# Patient Record
Sex: Female | Born: 1955 | Race: Black or African American | Hispanic: No | Smoking: Current every day smoker
Health system: Southern US, Community
[De-identification: ages and names within clinical notes are randomized; demographics above are authoritative.]

## PROBLEM LIST (undated history)

## (undated) DIAGNOSIS — M797 Fibromyalgia: Secondary | ICD-10-CM

## (undated) DIAGNOSIS — K219 Gastro-esophageal reflux disease without esophagitis: Secondary | ICD-10-CM

## (undated) DIAGNOSIS — G5600 Carpal tunnel syndrome, unspecified upper limb: Secondary | ICD-10-CM

## (undated) DIAGNOSIS — F329 Major depressive disorder, single episode, unspecified: Secondary | ICD-10-CM

## (undated) DIAGNOSIS — J449 Chronic obstructive pulmonary disease, unspecified: Secondary | ICD-10-CM

## (undated) DIAGNOSIS — E785 Hyperlipidemia, unspecified: Secondary | ICD-10-CM

## (undated) DIAGNOSIS — E039 Hypothyroidism, unspecified: Secondary | ICD-10-CM

## (undated) DIAGNOSIS — G43909 Migraine, unspecified, not intractable, without status migrainosus: Secondary | ICD-10-CM

## (undated) DIAGNOSIS — G8929 Other chronic pain: Secondary | ICD-10-CM

## (undated) DIAGNOSIS — M2170 Unequal limb length (acquired), unspecified site: Secondary | ICD-10-CM

## (undated) DIAGNOSIS — R269 Unspecified abnormalities of gait and mobility: Secondary | ICD-10-CM

## (undated) DIAGNOSIS — C73 Malignant neoplasm of thyroid gland: Secondary | ICD-10-CM

## (undated) DIAGNOSIS — J189 Pneumonia, unspecified organism: Secondary | ICD-10-CM

## (undated) DIAGNOSIS — R0602 Shortness of breath: Secondary | ICD-10-CM

## (undated) DIAGNOSIS — M549 Dorsalgia, unspecified: Secondary | ICD-10-CM

## (undated) DIAGNOSIS — M1990 Unspecified osteoarthritis, unspecified site: Secondary | ICD-10-CM

## (undated) DIAGNOSIS — M069 Rheumatoid arthritis, unspecified: Secondary | ICD-10-CM

## (undated) DIAGNOSIS — R001 Bradycardia, unspecified: Secondary | ICD-10-CM

## (undated) HISTORY — DX: Bradycardia, unspecified: R00.1

## (undated) HISTORY — DX: Unspecified abnormalities of gait and mobility: R26.9

## (undated) HISTORY — PX: THYROIDECTOMY, PARTIAL: SHX18

## (undated) HISTORY — DX: Malignant neoplasm of thyroid gland: C73

## (undated) HISTORY — DX: Unequal limb length (acquired), unspecified site: M21.70

## (undated) HISTORY — DX: Gastro-esophageal reflux disease without esophagitis: K21.9

## (undated) HISTORY — DX: Carpal tunnel syndrome, unspecified upper limb: G56.00

## (undated) HISTORY — DX: Major depressive disorder, single episode, unspecified: F32.9

## (undated) HISTORY — PX: TUBAL LIGATION: SHX77

## (undated) HISTORY — DX: Hyperlipidemia, unspecified: E78.5

## (undated) HISTORY — PX: TOTAL ABDOMINAL HYSTERECTOMY: SHX209

## (undated) HISTORY — DX: Chronic obstructive pulmonary disease, unspecified: J44.9

## (undated) HISTORY — DX: Hypothyroidism, unspecified: E03.9

---

## 1998-09-08 ENCOUNTER — Encounter: Admission: RE | Admit: 1998-09-08 | Discharge: 1998-09-08 | Payer: Self-pay | Admitting: Family Medicine

## 1998-10-23 ENCOUNTER — Encounter: Admission: RE | Admit: 1998-10-23 | Discharge: 1998-10-23 | Payer: Self-pay | Admitting: Family Medicine

## 1998-11-25 ENCOUNTER — Encounter: Admission: RE | Admit: 1998-11-25 | Discharge: 1998-11-25 | Payer: Self-pay | Admitting: Sports Medicine

## 1999-03-11 ENCOUNTER — Encounter: Admission: RE | Admit: 1999-03-11 | Discharge: 1999-03-11 | Payer: Self-pay | Admitting: Family Medicine

## 1999-03-18 ENCOUNTER — Encounter: Admission: RE | Admit: 1999-03-18 | Discharge: 1999-03-18 | Payer: Self-pay | Admitting: Family Medicine

## 1999-04-05 ENCOUNTER — Emergency Department (HOSPITAL_COMMUNITY): Admission: EM | Admit: 1999-04-05 | Discharge: 1999-04-05 | Payer: Self-pay | Admitting: Emergency Medicine

## 1999-04-20 ENCOUNTER — Other Ambulatory Visit: Admission: RE | Admit: 1999-04-20 | Discharge: 1999-04-20 | Payer: Self-pay | Admitting: Obstetrics and Gynecology

## 1999-09-16 ENCOUNTER — Encounter (INDEPENDENT_AMBULATORY_CARE_PROVIDER_SITE_OTHER): Payer: Self-pay | Admitting: *Deleted

## 2000-11-04 ENCOUNTER — Other Ambulatory Visit: Admission: RE | Admit: 2000-11-04 | Discharge: 2000-11-04 | Payer: Self-pay | Admitting: Obstetrics and Gynecology

## 2000-11-20 ENCOUNTER — Encounter: Payer: Self-pay | Admitting: *Deleted

## 2000-11-20 ENCOUNTER — Emergency Department (HOSPITAL_COMMUNITY): Admission: EM | Admit: 2000-11-20 | Discharge: 2000-11-20 | Payer: Self-pay | Admitting: Emergency Medicine

## 2000-11-23 ENCOUNTER — Encounter: Payer: Self-pay | Admitting: *Deleted

## 2000-11-23 ENCOUNTER — Encounter: Admission: RE | Admit: 2000-11-23 | Discharge: 2000-11-23 | Payer: Self-pay | Admitting: *Deleted

## 2000-11-25 ENCOUNTER — Encounter: Admission: RE | Admit: 2000-11-25 | Discharge: 2000-11-25 | Payer: Self-pay | Admitting: Family Medicine

## 2001-01-09 ENCOUNTER — Encounter (INDEPENDENT_AMBULATORY_CARE_PROVIDER_SITE_OTHER): Payer: Self-pay

## 2001-01-09 ENCOUNTER — Observation Stay (HOSPITAL_COMMUNITY): Admission: RE | Admit: 2001-01-09 | Discharge: 2001-01-10 | Payer: Self-pay | Admitting: Obstetrics and Gynecology

## 2001-03-01 ENCOUNTER — Encounter: Admission: RE | Admit: 2001-03-01 | Discharge: 2001-03-01 | Payer: Self-pay | Admitting: Family Medicine

## 2001-03-15 ENCOUNTER — Encounter: Admission: RE | Admit: 2001-03-15 | Discharge: 2001-03-15 | Payer: Self-pay | Admitting: Family Medicine

## 2001-03-21 ENCOUNTER — Encounter: Admission: RE | Admit: 2001-03-21 | Discharge: 2001-03-21 | Payer: Self-pay | Admitting: Sports Medicine

## 2001-03-21 ENCOUNTER — Encounter: Payer: Self-pay | Admitting: Sports Medicine

## 2001-04-26 ENCOUNTER — Encounter: Admission: RE | Admit: 2001-04-26 | Discharge: 2001-04-26 | Payer: Self-pay | Admitting: Family Medicine

## 2001-05-01 ENCOUNTER — Encounter: Admission: RE | Admit: 2001-05-01 | Discharge: 2001-05-01 | Payer: Self-pay | Admitting: Family Medicine

## 2001-07-12 ENCOUNTER — Encounter: Admission: RE | Admit: 2001-07-12 | Discharge: 2001-07-12 | Payer: Self-pay | Admitting: Family Medicine

## 2001-07-26 ENCOUNTER — Encounter: Admission: RE | Admit: 2001-07-26 | Discharge: 2001-07-26 | Payer: Self-pay | Admitting: Family Medicine

## 2002-01-12 ENCOUNTER — Emergency Department (HOSPITAL_COMMUNITY): Admission: EM | Admit: 2002-01-12 | Discharge: 2002-01-12 | Payer: Self-pay | Admitting: Emergency Medicine

## 2002-01-22 ENCOUNTER — Emergency Department (HOSPITAL_COMMUNITY): Admission: EM | Admit: 2002-01-22 | Discharge: 2002-01-22 | Payer: Self-pay | Admitting: Emergency Medicine

## 2002-01-22 ENCOUNTER — Encounter: Payer: Self-pay | Admitting: Emergency Medicine

## 2002-07-17 ENCOUNTER — Emergency Department (HOSPITAL_COMMUNITY): Admission: EM | Admit: 2002-07-17 | Discharge: 2002-07-17 | Payer: Self-pay | Admitting: Emergency Medicine

## 2002-07-17 ENCOUNTER — Encounter: Payer: Self-pay | Admitting: Emergency Medicine

## 2002-10-15 ENCOUNTER — Encounter: Admission: RE | Admit: 2002-10-15 | Discharge: 2002-10-15 | Payer: Self-pay | Admitting: Family Medicine

## 2002-11-16 ENCOUNTER — Encounter: Admission: RE | Admit: 2002-11-16 | Discharge: 2002-11-16 | Payer: Self-pay | Admitting: Family Medicine

## 2002-11-29 ENCOUNTER — Encounter: Admission: RE | Admit: 2002-11-29 | Discharge: 2002-11-29 | Payer: Self-pay | Admitting: Family Medicine

## 2002-11-30 ENCOUNTER — Encounter: Admission: RE | Admit: 2002-11-30 | Discharge: 2002-11-30 | Payer: Self-pay | Admitting: Family Medicine

## 2003-01-18 ENCOUNTER — Encounter: Payer: Self-pay | Admitting: *Deleted

## 2003-01-18 ENCOUNTER — Emergency Department (HOSPITAL_COMMUNITY): Admission: EM | Admit: 2003-01-18 | Discharge: 2003-01-18 | Payer: Self-pay | Admitting: Emergency Medicine

## 2003-01-23 ENCOUNTER — Encounter: Admission: RE | Admit: 2003-01-23 | Discharge: 2003-01-23 | Payer: Self-pay | Admitting: Family Medicine

## 2003-02-13 ENCOUNTER — Encounter: Admission: RE | Admit: 2003-02-13 | Discharge: 2003-02-13 | Payer: Self-pay | Admitting: Family Medicine

## 2003-02-21 ENCOUNTER — Encounter: Admission: RE | Admit: 2003-02-21 | Discharge: 2003-02-21 | Payer: Self-pay | Admitting: Family Medicine

## 2003-03-07 ENCOUNTER — Encounter: Admission: RE | Admit: 2003-03-07 | Discharge: 2003-03-07 | Payer: Self-pay | Admitting: Family Medicine

## 2004-11-15 DIAGNOSIS — F32A Depression, unspecified: Secondary | ICD-10-CM

## 2004-11-15 HISTORY — DX: Depression, unspecified: F32.A

## 2005-03-23 ENCOUNTER — Emergency Department (HOSPITAL_COMMUNITY): Admission: EM | Admit: 2005-03-23 | Discharge: 2005-03-23 | Payer: Self-pay | Admitting: Family Medicine

## 2005-08-16 ENCOUNTER — Encounter (INDEPENDENT_AMBULATORY_CARE_PROVIDER_SITE_OTHER): Payer: Self-pay | Admitting: Specialist

## 2005-08-16 ENCOUNTER — Encounter: Admission: RE | Admit: 2005-08-16 | Discharge: 2005-08-16 | Payer: Self-pay | Admitting: Emergency Medicine

## 2005-08-16 ENCOUNTER — Other Ambulatory Visit: Admission: RE | Admit: 2005-08-16 | Discharge: 2005-08-16 | Payer: Self-pay | Admitting: Interventional Radiology

## 2005-11-13 ENCOUNTER — Emergency Department (HOSPITAL_COMMUNITY): Admission: EM | Admit: 2005-11-13 | Discharge: 2005-11-13 | Payer: Self-pay | Admitting: Family Medicine

## 2005-11-19 ENCOUNTER — Emergency Department (HOSPITAL_COMMUNITY): Admission: EM | Admit: 2005-11-19 | Discharge: 2005-11-19 | Payer: Self-pay | Admitting: Family Medicine

## 2005-12-06 ENCOUNTER — Encounter (INDEPENDENT_AMBULATORY_CARE_PROVIDER_SITE_OTHER): Payer: Self-pay | Admitting: *Deleted

## 2005-12-06 ENCOUNTER — Ambulatory Visit (HOSPITAL_COMMUNITY): Admission: RE | Admit: 2005-12-06 | Discharge: 2005-12-07 | Payer: Self-pay

## 2005-12-27 ENCOUNTER — Emergency Department (HOSPITAL_COMMUNITY): Admission: EM | Admit: 2005-12-27 | Discharge: 2005-12-27 | Payer: Self-pay | Admitting: Family Medicine

## 2006-01-28 ENCOUNTER — Encounter (INDEPENDENT_AMBULATORY_CARE_PROVIDER_SITE_OTHER): Payer: Self-pay | Admitting: Specialist

## 2006-01-28 ENCOUNTER — Ambulatory Visit (HOSPITAL_COMMUNITY): Admission: RE | Admit: 2006-01-28 | Discharge: 2006-01-29 | Payer: Self-pay

## 2006-03-08 ENCOUNTER — Encounter: Admission: RE | Admit: 2006-03-08 | Discharge: 2006-03-08 | Payer: Self-pay | Admitting: Endocrinology

## 2006-03-17 ENCOUNTER — Encounter: Admission: RE | Admit: 2006-03-17 | Discharge: 2006-03-17 | Payer: Self-pay | Admitting: Endocrinology

## 2006-03-24 ENCOUNTER — Encounter: Admission: RE | Admit: 2006-03-24 | Discharge: 2006-03-24 | Payer: Self-pay | Admitting: Endocrinology

## 2006-07-27 ENCOUNTER — Encounter: Admission: RE | Admit: 2006-07-27 | Discharge: 2006-07-27 | Payer: Self-pay | Admitting: Internal Medicine

## 2006-07-31 ENCOUNTER — Emergency Department (HOSPITAL_COMMUNITY): Admission: EM | Admit: 2006-07-31 | Discharge: 2006-07-31 | Payer: Self-pay | Admitting: Emergency Medicine

## 2006-11-10 ENCOUNTER — Emergency Department (HOSPITAL_COMMUNITY): Admission: EM | Admit: 2006-11-10 | Discharge: 2006-11-10 | Payer: Self-pay | Admitting: Emergency Medicine

## 2007-01-13 ENCOUNTER — Encounter (INDEPENDENT_AMBULATORY_CARE_PROVIDER_SITE_OTHER): Payer: Self-pay | Admitting: *Deleted

## 2007-03-27 ENCOUNTER — Ambulatory Visit: Payer: Self-pay | Admitting: Psychiatry

## 2007-03-27 ENCOUNTER — Other Ambulatory Visit (HOSPITAL_COMMUNITY): Admission: RE | Admit: 2007-03-27 | Discharge: 2007-04-07 | Payer: Self-pay | Admitting: Psychiatry

## 2007-05-03 ENCOUNTER — Ambulatory Visit (HOSPITAL_COMMUNITY): Payer: Self-pay | Admitting: Psychiatry

## 2007-05-24 ENCOUNTER — Encounter: Admission: RE | Admit: 2007-05-24 | Discharge: 2007-05-24 | Payer: Self-pay | Admitting: Endocrinology

## 2007-05-30 ENCOUNTER — Ambulatory Visit (HOSPITAL_COMMUNITY): Payer: Self-pay | Admitting: Psychiatry

## 2007-08-10 ENCOUNTER — Emergency Department (HOSPITAL_COMMUNITY): Admission: EM | Admit: 2007-08-10 | Discharge: 2007-08-10 | Payer: Self-pay | Admitting: Emergency Medicine

## 2007-11-23 ENCOUNTER — Emergency Department (HOSPITAL_COMMUNITY): Admission: EM | Admit: 2007-11-23 | Discharge: 2007-11-23 | Payer: Self-pay | Admitting: Family Medicine

## 2007-12-06 ENCOUNTER — Emergency Department (HOSPITAL_COMMUNITY): Admission: EM | Admit: 2007-12-06 | Discharge: 2007-12-06 | Payer: Self-pay | Admitting: Emergency Medicine

## 2008-05-13 ENCOUNTER — Emergency Department (HOSPITAL_COMMUNITY): Admission: EM | Admit: 2008-05-13 | Discharge: 2008-05-13 | Payer: Self-pay | Admitting: Emergency Medicine

## 2008-06-05 ENCOUNTER — Emergency Department (HOSPITAL_COMMUNITY): Admission: EM | Admit: 2008-06-05 | Discharge: 2008-06-05 | Payer: Self-pay | Admitting: Emergency Medicine

## 2008-10-08 ENCOUNTER — Inpatient Hospital Stay (HOSPITAL_COMMUNITY): Admission: EM | Admit: 2008-10-08 | Discharge: 2008-10-09 | Payer: Self-pay | Admitting: Emergency Medicine

## 2008-10-16 ENCOUNTER — Ambulatory Visit: Payer: Self-pay | Admitting: Cardiology

## 2008-10-28 ENCOUNTER — Emergency Department (HOSPITAL_COMMUNITY): Admission: EM | Admit: 2008-10-28 | Discharge: 2008-10-28 | Payer: Self-pay | Admitting: Emergency Medicine

## 2008-11-01 ENCOUNTER — Ambulatory Visit: Payer: Self-pay

## 2008-11-01 ENCOUNTER — Encounter: Payer: Self-pay | Admitting: Cardiology

## 2008-11-28 ENCOUNTER — Ambulatory Visit: Payer: Self-pay | Admitting: Cardiology

## 2008-12-09 ENCOUNTER — Encounter: Payer: Self-pay | Admitting: Cardiology

## 2008-12-09 ENCOUNTER — Ambulatory Visit: Payer: Self-pay

## 2009-01-08 ENCOUNTER — Emergency Department (HOSPITAL_COMMUNITY): Admission: EM | Admit: 2009-01-08 | Discharge: 2009-01-08 | Payer: Self-pay | Admitting: Emergency Medicine

## 2009-03-17 ENCOUNTER — Emergency Department (HOSPITAL_COMMUNITY): Admission: EM | Admit: 2009-03-17 | Discharge: 2009-03-17 | Payer: Self-pay | Admitting: Emergency Medicine

## 2009-06-30 ENCOUNTER — Encounter (HOSPITAL_COMMUNITY): Admission: RE | Admit: 2009-06-30 | Discharge: 2009-08-14 | Payer: Self-pay | Admitting: Endocrinology

## 2009-12-03 ENCOUNTER — Ambulatory Visit: Payer: Self-pay | Admitting: Internal Medicine

## 2009-12-03 ENCOUNTER — Emergency Department (HOSPITAL_COMMUNITY): Admission: EM | Admit: 2009-12-03 | Discharge: 2009-12-03 | Payer: Self-pay | Admitting: Emergency Medicine

## 2010-02-11 DIAGNOSIS — E039 Hypothyroidism, unspecified: Secondary | ICD-10-CM

## 2010-02-11 DIAGNOSIS — F3289 Other specified depressive episodes: Secondary | ICD-10-CM | POA: Insufficient documentation

## 2010-02-11 DIAGNOSIS — C73 Malignant neoplasm of thyroid gland: Secondary | ICD-10-CM

## 2010-02-11 DIAGNOSIS — I495 Sick sinus syndrome: Secondary | ICD-10-CM

## 2010-02-11 DIAGNOSIS — K219 Gastro-esophageal reflux disease without esophagitis: Secondary | ICD-10-CM | POA: Insufficient documentation

## 2010-02-11 DIAGNOSIS — F329 Major depressive disorder, single episode, unspecified: Secondary | ICD-10-CM

## 2010-02-11 DIAGNOSIS — J449 Chronic obstructive pulmonary disease, unspecified: Secondary | ICD-10-CM | POA: Insufficient documentation

## 2010-02-11 DIAGNOSIS — R079 Chest pain, unspecified: Secondary | ICD-10-CM

## 2010-02-11 DIAGNOSIS — R42 Dizziness and giddiness: Secondary | ICD-10-CM | POA: Insufficient documentation

## 2010-02-12 ENCOUNTER — Ambulatory Visit: Payer: Self-pay | Admitting: Cardiology

## 2010-02-12 DIAGNOSIS — F17200 Nicotine dependence, unspecified, uncomplicated: Secondary | ICD-10-CM

## 2010-05-28 ENCOUNTER — Emergency Department (HOSPITAL_COMMUNITY): Admission: EM | Admit: 2010-05-28 | Discharge: 2010-05-28 | Payer: Self-pay | Admitting: Emergency Medicine

## 2010-06-19 ENCOUNTER — Ambulatory Visit (HOSPITAL_COMMUNITY): Admission: RE | Admit: 2010-06-19 | Discharge: 2010-06-19 | Payer: Self-pay | Admitting: Psychiatry

## 2010-06-22 ENCOUNTER — Emergency Department (HOSPITAL_COMMUNITY): Admission: EM | Admit: 2010-06-22 | Discharge: 2010-06-22 | Payer: Self-pay | Admitting: Family Medicine

## 2010-06-22 ENCOUNTER — Ambulatory Visit: Payer: Self-pay | Admitting: Cardiology

## 2010-06-22 ENCOUNTER — Encounter: Payer: Self-pay | Admitting: Cardiology

## 2010-06-22 ENCOUNTER — Observation Stay (HOSPITAL_COMMUNITY): Admission: EM | Admit: 2010-06-22 | Discharge: 2010-06-23 | Payer: Self-pay | Admitting: Emergency Medicine

## 2010-06-22 ENCOUNTER — Other Ambulatory Visit (HOSPITAL_COMMUNITY): Admission: RE | Admit: 2010-06-22 | Discharge: 2010-07-08 | Payer: Self-pay | Admitting: Psychiatry

## 2010-06-23 ENCOUNTER — Encounter: Payer: Self-pay | Admitting: Cardiology

## 2010-06-25 ENCOUNTER — Telehealth (INDEPENDENT_AMBULATORY_CARE_PROVIDER_SITE_OTHER): Payer: Self-pay | Admitting: Radiology

## 2010-06-29 ENCOUNTER — Encounter (HOSPITAL_COMMUNITY): Admission: RE | Admit: 2010-06-29 | Discharge: 2010-08-04 | Payer: Self-pay | Admitting: Cardiology

## 2010-06-29 ENCOUNTER — Encounter: Payer: Self-pay | Admitting: Cardiology

## 2010-06-29 ENCOUNTER — Ambulatory Visit: Payer: Self-pay

## 2010-06-29 ENCOUNTER — Ambulatory Visit: Payer: Self-pay | Admitting: Cardiology

## 2010-07-08 ENCOUNTER — Encounter (INDEPENDENT_AMBULATORY_CARE_PROVIDER_SITE_OTHER): Payer: Self-pay | Admitting: *Deleted

## 2010-07-10 ENCOUNTER — Ambulatory Visit: Payer: Self-pay | Admitting: Psychiatry

## 2010-07-10 ENCOUNTER — Telehealth: Payer: Self-pay | Admitting: Cardiology

## 2010-07-21 ENCOUNTER — Encounter: Payer: Self-pay | Admitting: Physician Assistant

## 2010-10-12 ENCOUNTER — Ambulatory Visit (HOSPITAL_COMMUNITY)
Admission: RE | Admit: 2010-10-12 | Discharge: 2010-10-12 | Payer: Self-pay | Source: Home / Self Care | Admitting: Internal Medicine

## 2010-10-13 HISTORY — PX: COLONOSCOPY: SHX174

## 2010-10-15 ENCOUNTER — Encounter: Admission: RE | Admit: 2010-10-15 | Discharge: 2010-10-15 | Payer: Self-pay | Admitting: Internal Medicine

## 2010-10-18 ENCOUNTER — Observation Stay (HOSPITAL_COMMUNITY)
Admission: EM | Admit: 2010-10-18 | Discharge: 2010-10-20 | Payer: Self-pay | Source: Home / Self Care | Admitting: Emergency Medicine

## 2010-11-11 ENCOUNTER — Ambulatory Visit: Payer: Self-pay | Admitting: Cardiology

## 2010-12-06 ENCOUNTER — Encounter: Payer: Self-pay | Admitting: Endocrinology

## 2010-12-06 ENCOUNTER — Encounter: Payer: Self-pay | Admitting: Internal Medicine

## 2010-12-17 NOTE — Miscellaneous (Signed)
  Clinical Lists Changes  Observations: Added new observation of NUCLEAR NOS: Exercise Capacity: Good exercise capacity. BP Response: Normal blood pressure response. Clinical Symptoms: No chest pain ECG Impression: No significant ST segment change suggestive of ischemia. Overall Impression: Normal stress nuclear study.  She had PVCs in recovery.   (06/29/2010 10:46) Added new observation of CXR RESULTS: The heart size and pulmonary vascularity are normal and   the lungs are clear.  No osseous abnormality. Evidence of prior   thyroid surgery.    IMPRESSION:   Normal chest. (06/22/2010 10:47)      Nuclear Study  Procedure date:  06/29/2010  Findings:      Exercise Capacity: Good exercise capacity. BP Response: Normal blood pressure response. Clinical Symptoms: No chest pain ECG Impression: No significant ST segment change suggestive of ischemia. Overall Impression: Normal stress nuclear study.  She had PVCs in recovery.    CXR  Procedure date:  06/22/2010  Findings:      The heart size and pulmonary vascularity are normal and   the lungs are clear.  No osseous abnormality. Evidence of prior   thyroid surgery.    IMPRESSION:   Normal chest.

## 2010-12-17 NOTE — Progress Notes (Signed)
Summary: return to work  Phone Note Call from Patient Call back at [phone]   Summary of Call: pt would like to know if she can go back to work. 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

## 2010-12-17 NOTE — Assessment & Plan Note (Signed)
Summary: rov per pt call/lg  Medications Added SYNTHROID 137 MCG TABS (LEVOTHYROXINE SODIUM) 1 tab by mouth once daily VITAMIN C 500 MG TABS (ASCORBIC ACID) 1 tab by mouth once daily ASPIRIN 81 MG TBEC (ASPIRIN) Take one tablet by mouth daily        History of Present Illness: Emma Wilson is a pleasant  female who I have seen in the past for dizziness, chest pain, and bradycardia.  It was noted previously that her TSH had come back at 123.  We felt that her symptoms were most likely related to hypothyroidism.  We did increase her Synthroid from 88 mcg a day to 125 mcg a day. Echocardiogram in December of 2009 showed normal LV function and no significant valvular abnormalities. Stress echocardiogram in January of 2010 was normal. I last saw her in January of 2010. Since then she was seen in the emergency room by Dr. Gala Romney for her atypical chest pain and bradycardia. Note enzymes were negative. A chest x-ray showed COPD. Since that time she occasionally has mild dyspnea on exertion relieved with rest. There is no associated chest pain. There is no orthopnea, PND or pedal edema. She's had no further palpitations and there's been no syncope. She occasionally feels a brief pain when she is "worried" she does not have exertional chest pain.  Current Medications (verified): 1)  Synthroid 137 Mcg Tabs (Levothyroxine Sodium) .Marland Kitchen.. 1 Tab By Mouth Once Daily 2)  Vitamin C 500 Mg Tabs (Ascorbic Acid) .Marland Kitchen.. 1 Tab By Mouth Once Daily 3)  Aspirin 81 Mg Tbec (Aspirin) .... Take One Tablet By Mouth Daily  Past History:  Past Medical History: THYROID CANCER (ICD-193) COPD (ICD-496) DEPRESSION (ICD-311) GERD (ICD-530.81) SINUS BRADYCARDIA (ICD-427.81) HYPOTHYROIDISM (ICD-244.9) microscopic hematuria (urol & nephrol w/u neg), multiple UTI`s  Past Surgical History: Reviewed history from 02/11/2010 and no changes required. TAH, BSO (benign reasons) - 01/14/2003   hysterectomy   thyroidectomy   Social  History: Reviewed history from 02/11/2010 and no changes required. The patient has a 16 plus-pack year ongoing tobacco   abuse disorder, currently smoking approximately one-half pack per day.   She very rarely consumes EtOH, never binge drinks.  No illicit drug use.   No herbal medication.  Regular diet and no regular exercise.   Review of Systems       Some fatigue and occasional leg pain but no fevers or chills, productive cough, hemoptysis, dysphasia, odynophagia, melena, hematochezia, dysuria, hematuria, rash, seizure activity, orthopnea, PND, pedal edema, claudication. Remaining systems are negative.   Vital Signs:  Patient profile:   55 year old female Height:      67 inches Weight:      136 pounds BMI:     21.38 Pulse rate:   51 / minute Resp:     12 per minute BP sitting:   115 / 80  (left arm)  Vitals Entered By: Kem Parkinson (February 12, 2010 8:16 AM)  Physical Exam  General:  Well-developed well-nourished in no acute distress.  Skin is warm and dry.  HEENT is normal.  Neck is supple. No thyromegaly.  Chest is clear to auscultation with normal expansion.  Cardiovascular exam is regular rate and rhythm.  Abdominal exam nontender or distended. No masses palpated. Extremities show no edema. neuro grossly intact    EKG  Procedure date:  02/12/2010  Findings:      Sinus bradycardia at a rate of 51. Axis normal. Nonspecific ST changes.  Impression & Recommendations:  Problem #  1:  SINUS BRADYCARDIA (ICD-427.81) No obvious symptoms. No further intervention warranted. Her updated medication list for this problem includes:    Aspirin 81 Mg Tbec (Aspirin) .Marland Kitchen... Take one tablet by mouth daily  Problem # 2:  DIZZINESS (ICD-780.4) No further symptoms.  Problem # 3:  COPD (ICD-496) Management per primary care.  Problem # 4:  HYPOTHYROIDISM (ICD-244.9) Management per endocrinology. Her updated medication list for this problem includes:    Synthroid 137 Mcg  Tabs (Levothyroxine sodium) .Marland Kitchen... 1 tab by mouth once daily  Problem # 5:  CHEST PAIN (ICD-786.50) Symptoms atypical. Previous stress echo normal. No further workup at this time. Her updated medication list for this problem includes:    Aspirin 81 Mg Tbec (Aspirin) .Marland Kitchen... Take one tablet by mouth daily  Problem # 6:  TOBACCO ABUSE (ICD-305.1) Patient counseled on discontinuing.

## 2010-12-17 NOTE — Letter (Signed)
Summary: Appointment - Reschedule  [facility], Main Office  [address]   [place], [place] [address]   Phone: [phone]  Fax: [phone]     July 08, 2010 MRN: [id number]   [facility] [address] [place], [place]  [address]   Dear Ms. Harvel,   Due to a change in our office schedule, your appointment on August 30,2011 at  12:15 must be changed.  It is very important that we reach you to reschedule this appointment. We look forward to participating in your health care needs. Please contact us at the number listed above at your earliest convenience to reschedule this appointment.     Sincerely, Artist

## 2010-12-17 NOTE — Progress Notes (Signed)
Summary: Nuc Pre- Procedure  Phone Note Outgoing Call Call back at Home Phone [phone]   Call placed by: Antionette Char RN,  June 25, 2010 1:29 PM Call placed to: Patient Reason for Call: Confirm/change Appt Summary of Call: Attempted to call three times with no answer.

## 2010-12-17 NOTE — Assessment & Plan Note (Signed)
Summary: Cardiology Nuclear Testing  Nuclear Med Background Indications for Stress Test: Evaluation for Ischemia, Post Hospital  Indications Comments: 06/22/10 [facility] CP with Neg. Enzymes, Abnormal EKG  History: COPD, Echo  History Comments: 1/10 Stress Echo Negative History of Thyroid Cancer  Symptoms: Chest Pain, Chest Pressure, Chest Pressure with Exertion, Diaphoresis, Dizziness, DOE, Fatigue, Fatigue with Exertion, Light-Headedness, Nausea, Palpitations, SOB  Symptoms Comments: Continuous (L) hand  numbness x 1 week per patient.   Nuclear Pre-Procedure Cardiac Risk Factors: Family History - CAD, Lipids, Smoker Caffeine/Decaff Intake: None NPO After: 8:00 PM Lungs: Clear IV 0.9% NS with Angio Cath: 22g     IV Site: (R) Hand IV Started by: Edwyna Perfect, RN Chest Size (in) 36     Cup Size C     Height (in): 67 Weight (lb): 135 BMI: 21.22  Nuclear Med Study 1 or 2 day study:  1 day     Stress Test Type:  Stress Reading MD:  Willa Rough, MD     Referring MD:  B. Crenshaw Resting Radionuclide:  Technetium 51m Tetrofosmin     Resting Radionuclide Dose:  11 mCi  Stress Radionuclide:  Technetium 73m Tetrofosmin     Stress Radionuclide Dose:  33 mCi   Stress Protocol Exercise Time (min):  9:46 min     Max HR:  151 bpm     Predicted Max HR:  168 bpm  Max Systolic BP: 146 mm Hg     Percent Max HR:  89.88 %     METS: 11.0 Rate Pressure Product:  82956    Stress Test Technologist:  Irean Hong RN     Nuclear Technologist:  Domenic Polite CNMT  Rest Procedure  Myocardial perfusion imaging was performed at rest 45 minutes following the intravenous administration of Myoview Technetium 16m Tetrofosmin.  Stress Procedure  The patient exercised for 9 minutes and 46 seconds, RPE=16.   The patient stopped due to DOE, fatique,   and denied any chest pain.  The EKG was nondiagnostic due to baseline T-wave inversion, Occ. PVC's, PAC's, fusion beat,and trigeminy PVC x1. The patient had  runs of begiminy PVC's in early recovery.  Myoview was injected at peak exercise and myocardial perfusion imaging was performed after a brief delay.  QPS Raw Data Images:  Normal; no motion artifact; normal heart/lung ratio. Stress Images:  There is normal uptake in all areas. Rest Images:  Normal homogeneous uptake in all areas of the myocardium. Subtraction (SDS):  No evidence of ischemia. Transient Ischemic Dilatation:  1.08  (Normal <1.22)  Lung/Heart Ratio:  .27  (Normal <0.45)  Quantitative Gated Spect Images QGS EDV:  73 ml QGS ESV:  32 ml QGS EF:  55 % QGS cine images:  Normal motion  Findings Normal nuclear study      Overall Impression  Exercise Capacity: Good exercise capacity. BP Response: Normal blood pressure response. Clinical Symptoms: No chest pain ECG Impression: No significant ST segment change suggestive of ischemia. Overall Impression: Normal stress nuclear study.  She had PVCs in recovery.  Appended Document: Cardiology Nuclear Testing ok  Appended Document: Cardiology Nuclear Testing unable to reach pt or leave a message    Appended Document: Cardiology Nuclear Testing pt aware of results

## 2011-01-25 LAB — BASIC METABOLIC PANEL
BUN: 6 mg/dL (ref 6–23)
CO2: 26 mEq/L (ref 19–32)
Calcium: 8.7 mg/dL (ref 8.4–10.5)
Chloride: 95 mEq/L — ABNORMAL LOW (ref 96–112)
GFR calc Af Amer: >60 mL/min (ref >60)
GFR calc non Af Amer: 57 mL/min — ABNORMAL LOW (ref >60)
Potassium: 3 mEq/L — ABNORMAL LOW (ref 3.5–5.1)
Potassium: 4 mEq/L (ref 3.5–5.1)
Sodium: 129 mEq/L — ABNORMAL LOW (ref 135–145)
Sodium: 138 mEq/L (ref 135–145)

## 2011-01-25 LAB — CBC
HCT: 29.7 % — ABNORMAL LOW (ref 36.0–46.0)
HCT: 32.2 % — ABNORMAL LOW (ref 36.0–46.0)
Hemoglobin: 10.2 g/dL — ABNORMAL LOW (ref 12.0–15.0)
Hemoglobin: 11.1 g/dL — ABNORMAL LOW (ref 12.0–15.0)
MCH: 28.2 pg (ref 26.0–34.0)
MCV: 81.9 fL (ref 78.0–100.0)
RBC: 3.93 MIL/uL (ref 3.87–5.11)
RDW: 14.3 % (ref 11.5–15.5)
WBC: 4.6 10*3/uL (ref 4.0–10.5)

## 2011-01-25 LAB — TSH: TSH: 130.451 u[IU]/mL — ABNORMAL HIGH (ref 0.350–4.500)

## 2011-01-25 LAB — CK TOTAL AND CKMB (NOT AT ARMC)
CK, MB: 5 ng/mL — ABNORMAL HIGH (ref 0.3–4.0)
Total CK: 1031 U/L — ABNORMAL HIGH (ref 7–177)

## 2011-01-25 LAB — DIFFERENTIAL
Eosinophils Absolute: 0.3 10*3/uL (ref 0.0–0.7)
Lymphs Abs: 2.7 10*3/uL (ref 0.7–4.0)
Monocytes Absolute: 0.3 10*3/uL (ref 0.1–1.0)
Monocytes Relative: 7 % (ref 3–12)
Neutrophils Relative %: 28 % — ABNORMAL LOW (ref 43–77)

## 2011-01-25 LAB — CARDIAC PANEL(CRET KIN+CKTOT+MB+TROPI)
CK, MB: 3.2 ng/mL (ref 0.3–4.0)
Relative Index: 0.4 (ref 0.0–2.5)

## 2011-01-25 LAB — POCT CARDIAC MARKERS: Myoglobin, poc: 144 ng/mL (ref 12–200)

## 2011-01-25 LAB — TROPONIN I: Troponin I: 0.01 ng/mL (ref 0.00–0.06)

## 2011-01-29 LAB — DIFFERENTIAL
Basophils Relative: 1 % (ref 0–1)
Lymphocytes Relative: 51 % — ABNORMAL HIGH (ref 12–46)
Lymphs Abs: 2.5 10*3/uL (ref 0.7–4.0)
Monocytes Absolute: 0.4 10*3/uL (ref 0.1–1.0)
Monocytes Relative: 7 % (ref 3–12)
Neutro Abs: 1.6 10*3/uL — ABNORMAL LOW (ref 1.7–7.7)

## 2011-01-29 LAB — IRON AND TIBC
TIBC: 268 ug/dL (ref 250–470)
UIBC: 168 ug/dL

## 2011-01-29 LAB — CBC
HCT: 30 % — ABNORMAL LOW (ref 36.0–46.0)
HCT: 30.2 % — ABNORMAL LOW (ref 36.0–46.0)
Hemoglobin: 10.3 g/dL — ABNORMAL LOW (ref 12.0–15.0)
Hemoglobin: 10.7 g/dL — ABNORMAL LOW (ref 12.0–15.0)
MCH: 29.1 pg (ref 26.0–34.0)
MCHC: 34.3 g/dL (ref 30.0–36.0)
MCHC: 35.4 g/dL (ref 30.0–36.0)
RBC: 3.54 MIL/uL — ABNORMAL LOW (ref 3.87–5.11)
RBC: 3.58 MIL/uL — ABNORMAL LOW (ref 3.87–5.11)

## 2011-01-29 LAB — POCT CARDIAC MARKERS
CKMB, poc: 1.6 ng/mL (ref 1.0–8.0)
CKMB, poc: 2.2 ng/mL (ref 1.0–8.0)
Myoglobin, poc: 74.9 ng/mL (ref 12–200)
Troponin i, poc: <0.05 ng/mL (ref 0.00–0.09)

## 2011-01-29 LAB — HEPARIN LEVEL (UNFRACTIONATED): Heparin Unfractionated: 0.78 IU/mL — ABNORMAL HIGH (ref 0.30–0.70)

## 2011-01-29 LAB — BASIC METABOLIC PANEL
BUN: 7 mg/dL (ref 6–23)
CO2: 24 mEq/L (ref 19–32)
Calcium: 8.6 mg/dL (ref 8.4–10.5)
Creatinine, Ser: 0.99 mg/dL (ref 0.4–1.2)
GFR calc non Af Amer: 59 mL/min — ABNORMAL LOW (ref >60)
GFR calc non Af Amer: >60 mL/min (ref >60)
Glucose, Bld: 105 mg/dL — ABNORMAL HIGH (ref 70–99)
Glucose, Bld: 81 mg/dL (ref 70–99)
Potassium: 3.5 mEq/L (ref 3.5–5.1)
Sodium: 137 mEq/L (ref 135–145)

## 2011-01-29 LAB — LIPID PANEL
Cholesterol: 313 mg/dL — ABNORMAL HIGH (ref 0–200)
HDL: 55 mg/dL (ref >39)
Triglycerides: 97 mg/dL (ref <150)

## 2011-01-29 LAB — CARDIAC PANEL(CRET KIN+CKTOT+MB+TROPI)
Relative Index: 0.7 (ref 0.0–2.5)
Total CK: 374 U/L — ABNORMAL HIGH (ref 7–177)
Troponin I: <0.01 ng/mL (ref 0.00–0.06)

## 2011-01-31 LAB — DIFFERENTIAL
Eosinophils Absolute: 0.2 10*3/uL (ref 0.0–0.7)
Lymphocytes Relative: 47 % — ABNORMAL HIGH (ref 12–46)
Lymphs Abs: 2.5 10*3/uL (ref 0.7–4.0)
Monocytes Relative: 5 % (ref 3–12)
Neutrophils Relative %: 41 % — ABNORMAL LOW (ref 43–77)

## 2011-01-31 LAB — POCT CARDIAC MARKERS
CKMB, poc: 2.2 ng/mL (ref 1.0–8.0)
CKMB, poc: 3.8 ng/mL (ref 1.0–8.0)
Myoglobin, poc: 57.4 ng/mL (ref 12–200)
Myoglobin, poc: 97.8 ng/mL (ref 12–200)
Troponin i, poc: <0.05 ng/mL (ref 0.00–0.09)

## 2011-01-31 LAB — CBC
MCV: 85 fL (ref 78.0–100.0)
Platelets: 229 10*3/uL (ref 150–400)
RBC: 4.48 MIL/uL (ref 3.87–5.11)
WBC: 5.4 10*3/uL (ref 4.0–10.5)

## 2011-01-31 LAB — POCT I-STAT, CHEM 8
BUN: 19 mg/dL (ref 6–23)
Chloride: 106 mEq/L (ref 96–112)
Creatinine, Ser: 1.1 mg/dL (ref 0.4–1.2)
Glucose, Bld: 104 mg/dL — ABNORMAL HIGH (ref 70–99)
Potassium: 3.7 mEq/L (ref 3.5–5.1)
Sodium: 140 mEq/L (ref 135–145)

## 2011-02-23 LAB — POCT I-STAT, CHEM 8
BUN: 12 mg/dL (ref 6–23)
Calcium, Ion: 1.19 mmol/L (ref 1.12–1.32)
Chloride: 107 mEq/L (ref 96–112)
Creatinine, Ser: 1 mg/dL (ref 0.4–1.2)
Glucose, Bld: 78 mg/dL (ref 70–99)
Potassium: 3.6 mEq/L (ref 3.5–5.1)

## 2011-02-23 LAB — DIFFERENTIAL
Lymphs Abs: 2.3 10*3/uL (ref 0.7–4.0)
Monocytes Absolute: 0.4 10*3/uL (ref 0.1–1.0)
Monocytes Relative: 8 % (ref 3–12)
Neutro Abs: 2.2 10*3/uL (ref 1.7–7.7)
Neutrophils Relative %: 41 % — ABNORMAL LOW (ref 43–77)

## 2011-02-23 LAB — CBC
Hemoglobin: 11.9 g/dL — ABNORMAL LOW (ref 12.0–15.0)
MCV: 85.2 fL (ref 78.0–100.0)
RBC: 4.2 MIL/uL (ref 3.87–5.11)
WBC: 5.2 10*3/uL (ref 4.0–10.5)

## 2011-03-20 ENCOUNTER — Inpatient Hospital Stay (INDEPENDENT_AMBULATORY_CARE_PROVIDER_SITE_OTHER)
Admission: RE | Admit: 2011-03-20 | Discharge: 2011-03-20 | Disposition: A | Discharge status: Home / Self Care | Payer: 59 | Source: Ambulatory Visit | Attending: Family Medicine | Admitting: Family Medicine

## 2011-03-20 DIAGNOSIS — K219 Gastro-esophageal reflux disease without esophagitis: Secondary | ICD-10-CM

## 2011-03-23 ENCOUNTER — Encounter: Payer: Self-pay | Admitting: Cardiology

## 2011-03-25 ENCOUNTER — Ambulatory Visit (INDEPENDENT_AMBULATORY_CARE_PROVIDER_SITE_OTHER): Payer: 59 | Admitting: Cardiology

## 2011-03-25 ENCOUNTER — Encounter: Payer: Self-pay | Admitting: Cardiology

## 2011-03-25 DIAGNOSIS — R079 Chest pain, unspecified: Secondary | ICD-10-CM

## 2011-03-25 DIAGNOSIS — I498 Other specified cardiac arrhythmias: Secondary | ICD-10-CM

## 2011-03-25 DIAGNOSIS — R001 Bradycardia, unspecified: Secondary | ICD-10-CM

## 2011-03-25 DIAGNOSIS — F17200 Nicotine dependence, unspecified, uncomplicated: Secondary | ICD-10-CM

## 2011-03-25 NOTE — Patient Instructions (Signed)
Your physician recommends that you schedule a follow-up appointment in: AS NEEDED  Your physician recommends that you continue on your current medications as directed. Please refer to the Current Medication list given to you today.  

## 2011-03-25 NOTE — Assessment & Plan Note (Signed)
Patient counseled on discontinuing. 

## 2011-03-25 NOTE — Assessment & Plan Note (Signed)
Patient with mild bradycardia. However I do not think there are symptoms associated with this. I therefore do not think further therapy is warranted. She has a long history of hypothyroidism and has not taken her Synthroid in the past. This may have contributed to her bradycardia previously. I have stressed compliance.

## 2011-03-25 NOTE — Progress Notes (Signed)
HPI: Emma Wilson is a pleasant  female who I have seen in the past for dizziness, chest pain, and bradycardia for fu. Echocardiogram in December of 2009 showed normal LV function and no significant valvular abnormalities. Stress echocardiogram in January of 2010 was normal. Myoview August of 2011 revealed EF of 53 and no ischemia. Admitted in December of 2011 with atypical chest pain. TSH 130. Patient noncompliant with medications. Patient recently went to urgent care report for indigestion. Her naproxen was discontinued. She followed up with her primary care physician and apparently her heart rate was in the high 40s. I do not have those records available. We will therefore asked to further evaluate. The patient denies dyspnea, palpitations or syncope. She occasionally has dizziness but this can occur at any time. She occasionally has chest pain which has been a long-standing issue. It is substernal without radiation. There are no associated symptoms. It is not exertional.  Current Outpatient Prescriptions  Medication Sig Dispense Refill  . Ascorbic Acid (VITAMIN C) 500 MG tablet Take 500 mg by mouth every other day.       . levothyroxine (SYNTHROID, LEVOTHROID) 137 MCG tablet Take 137 mcg by mouth daily.        . ranitidine (ZANTAC) 150 MG capsule Take 150 mg by mouth daily.        Marland Kitchen DISCONTD: aspirin 81 MG tablet Take 81 mg by mouth daily.           Past Medical History  Diagnosis Date  . Thyroid cancer   . COPD (chronic obstructive pulmonary disease)   . Depression   . GERD (gastroesophageal reflux disease)   . Sinus bradycardia   . Hypothyroidism   . Hematuria     microscopic hematuria (urol & nephrol w/u neg), multiple UTI`s    Past Surgical History  Procedure Date  . Total abdominal hysterectomy   . Thyroid surgery     History   Social History  . Marital Status: Divorced    Spouse Name: N/A    Number of Children: N/A  . Years of Education: N/A   Occupational History  . Not  on file.   Social History Main Topics  . Smoking status: Current Some Day Smoker  . Smokeless tobacco: Not on file  . Alcohol Use: Not on file  . Drug Use: No  . Sexually Active: Not on file   Other Topics Concern  . Not on file   Social History Narrative  . No narrative on file    ROS: no fevers or chills, productive cough, hemoptysis, dysphasia, odynophagia, melena, hematochezia, dysuria, hematuria, rash, seizure activity, orthopnea, PND, pedal edema, claudication. Remaining systems are negative.  Physical Exam: Well-developed well-nourished in no acute distress.  Skin is warm and dry.  HEENT is normal.  Neck is supple. No thyromegaly.  Chest is clear to auscultation with normal expansion.  Cardiovascular exam is regular rate and rhythm.  Abdominal exam nontender or distended. No masses palpated. Extremities show no edema. neuro grossly intact  ECG Sinus bradycardia with no ST changes.

## 2011-03-25 NOTE — Assessment & Plan Note (Signed)
Her symptoms are long-standing and atypical. Previous functional study normal. No further ischemia evaluation at this time.

## 2011-03-30 NOTE — Discharge Summary (Signed)
Emma Wilson, Emma Wilson              ACCOUNT NO.:  [id number]   MEDICAL RECORD NO.:  [id number]          PATIENT TYPE:  INP   LOCATION:  2002                         FACILITY:  [facility]   PHYSICIAN:  Thereasa Solo. Little, M.D. DATE OF BIRTH:  06-16-1956   DATE OF ADMISSION:  10/08/2008  DATE OF DISCHARGE:  10/09/2008                               DISCHARGE SUMMARY   DISCHARGE DIAGNOSES:  1. Vertigo.  2. Atypical chest pain.  3. Bradycardia with heart rate down to 39 sustaining at 41 on      admission.  4. Hypothyroidism.  5. History of thyroidectomy secondary to cancer.  6. Gastroesophageal reflux disease.  7. Polypharmacy with herbal medications.  She recently has just      started yellow dock in addition to her other herbs, which she      cannot name all.   LABS:  At the time of discharge, her TSH and free T4 are pending with CK-  MBs and troponins were negative, although her CK was elevated at 231 and  215, not her MBs, magnesium was 2.5.  Sodium 135, potassium 3.7, BUN 9,  creatinine 1.02.  Hemoglobin 12.2, hematocrit 37.2, WBCs 5.2 and  platelets 212.  Chest x-ray showed no acute disease.   DISCHARGE MEDICATIONS:  Her usual medications which include,  1. Synthroid 88 mcg per day.  2. Lexapro 10 mg every day.  3. Xanax 5 mg p.r.n.  4. Prilosec 10 mg every day.  5. We added on meclizine 12.5 mg every 8 hours as needed for vertigo      and she is instructed to stop taking herbs that slow her heart      rate.   HOSPITAL COURSE:  Emma Wilson is a 55 year old African American divorced  female patient who came to the emergency room because of dizziness and  some chest discomfort.  She stated that she had had a stressful day.  She went to a credit [employer], left because she could not take it and  went to [employer], she shopped, brought her groceries into her home  and then had an episode of dizziness.  It did not subside.  She laid  down in the couch and then she had some mild chest  discomfort that wax  and wane, described as a dull ache.  It was worse with movement and deep  breathing.  Thus, she droves in the emergency room.  The dizziness  seemed to be worse.  If she moved her head started from side to side or  tried to sit up, everything was pain.  She was not nauseated or  vomiting.  Her heart rate was sinus brady, lowest at 39, it was  sustaining between 40 and 42.  She had had no prior cardiac history and  no feeling of presyncope or syncope.  She was admitted to telemetry.  She was ruled out from MI.  Her TSH and free T4 were pending at time of  this dictation.  She was treated with a meclizine for her vertigo which  improved by the  next morning.  Her blood  pressures were stable at 98/60-112/72 and she  ambulated in the hall and her heart rate went up to 65.  She was  considered stable for discharge home.  We will follow up with her TSH  and free T4 as an outpatient.      Lezlie Octave, N.P.    ______________________________  Thereasa Solo. Little, M.D.    BB/MEDQ  D:  10/09/2008  T:  10/10/2008  Job:  [id number]   cc:   Deirdre Peer. Polite, M.D.

## 2011-03-30 NOTE — Assessment & Plan Note (Signed)
[facility]                            CARDIOLOGY OFFICE NOTE   NAME:Wilson, Emma WIRSING                     MRN:          [id number]  DATE:11/28/2008                            DOB:          1956/03/17    Emma Wilson is a pleasant 55 year old female who I recently saw on  October 16, 2008, for dizziness, chest pain, and bradycardia.  It was  noted at that time that she had been hospitalized previously in November  and her TSH had come back at 123.  We felt that her symptoms are most  likely related to hypothyroidism.  We did increase her Synthroid from 88  mcg a day to 125 mcg a day.  We also scheduled her to have an  echocardiogram which was performed on November 01, 2008.  Her LV  function was normal, and there was no significant valvular  abnormalities.  She also had mild chest pain that was somewhat atypical.  We plan to proceed with a stress test after her thyroid was treated and  improved.  Since then, she does feel markedly better.  She has minimal  dyspnea with more extreme activities, but not with routine activities.  There is no orthopnea, PND, pedal edema, palpitations, presyncope,  syncope, or exertional chest pain.   Medications include:  1. Lexapro 20 mg p.o. daily.  2. Nexium 40 mg p.o. daily.  3. Synthroid 125 mcg p.o. daily.   Physical examination today shows a blood pressure of 118/67 and her  pulse is 49.  She weighs 141 pounds.  Her HEENT is normal.  Her neck is  supple.  Her chest is clear.  Cardiovascular reveals a bradycardic rate,  but a regular rhythm.  Abdominal exam shows no tenderness.  Extremities  show no edema.   DIAGNOSES:  1. Hypothyroidism - the patient's symptoms are much improved.  I have      instructed her to follow up with Dr. Nehemiah Settle concerning further      adjustment of her Synthroid dose.  This will most likely need to be      increased further based on elevation of TSH previously.  However, I      will  leave this to Dr. Nehemiah Settle and she understands and is scheduled      to see him back next week.  2. Recent bradycardia - this is most likely due to hypothyroidism and      should improve as her Synthroid dose is fully adjusted.  3. Recent atypical chest pain - we will go ahead and schedule her      stress echocardiogram now.  If it shows no ischemia, we will see      her back on a p.r.n. basis.  4. History of depression.  5. Gastroesophageal reflux disease.    [name] Jens Som, MD, [facility]  Electronically Signed   BSC/MedQ  DD: 11/28/2008  DT: 11/28/2008  Job #: [id number]   cc:   Emma Wilson, M.D.

## 2011-03-30 NOTE — Assessment & Plan Note (Signed)
[facility]                            CARDIOLOGY OFFICE NOTE   NAME:Emma Wilson, Emma Wilson                     MRN:          [id number]  DATE:10/16/2008                            DOB:          1956-08-19    Emma Wilson is a very pleasant 55 year old female, who presents for  evaluation of dizziness, chest pain, and bradycardia.  The patient has  no prior cardiac history.  It should be noted that she has had a  previous thyroidectomy secondary to cancer.  She is on Synthroid.  She  was recently admitted to [facility] on October 08, 2008, under  [facility] and Vascular Service.  At that time, she was  complaining of dizziness and chest pain.  The chest pain was felt to be  somewhat atypical and she did rule out for myocardial infarction with  serial enzymes.  Also note at the time, she was noted to be bradycardic  as low as 39, but in the 40-42 range predominantly on admission.  On the  day of discharge, she was 65 based on the report.  It should also be  noted that she did have an electrocardiogram during the hospitalization  on October 09, 2008, that showed a sinus bradycardia at a rate of 45.  There were nonspecific ST changes.  She also had laboratories drawn, but  these apparently were not available at the time of discharge.  Her free  T4 came back at 0.33, which was low.  Her TSH was 123.773.  Note, her  cardiac markers were negative.  Also note that she had normal renal  function and hemoglobin and hematocrit 12.2 and 37.2 respectively.  Since that time, she continues to have some dizziness predominantly with  exertion.  There has been no syncope or palpitations.  She has mild  dyspnea with more moderate activities, but not with routine activities.  There is no orthopnea, PND, or pedal edema.  She also does occasionally  have chest pain.  The pain is in the left breast area.  It is not  clearly exertional.  It is not associated with  nausea, vomiting, or  diaphoresis, but she does have some shortness of breath.  Apparently in  the hospital, it did increase with inspiration.  Emma Wilson prefer to be  seen by [place].  She therefore presented for further evaluation today.   MEDICATIONS:  1. Lexapro 20 mg p.o. daily.  2. Synthroid 88 mcg p.o. daily.  3. Nexium 40 mg p.o. daily.  4. She takes Xanax as needed.   ALLERGIES:  She has no known drug allergies.   SOCIAL HISTORY:  She does smoke.  She occasionally consumes alcohol.   FAMILY HISTORY:  Positive for coronary artery disease in her mother.   PAST MEDICAL HISTORY:  Significant for no diabetes mellitus,  hypertension, or hyperlipidemia.  She does have a history of thyroid  cancer and has had previous thyroidectomy.  She has had a previous  hysterectomy.  She also has a history of anxiety and depression by her  report as well as gout.   REVIEW  OF SYSTEMS:  There is no headaches, fevers, or chills.  There is  no productive cough or hemoptysis.  There is no dysphagia, odynophagia,  melena, or hematochezia.  There is no dysuria or hematuria.  There is no  rashes or seizure activity.  There is no orthopnea, PND, or pedal edema.  She denies any cold intolerance.  She has gained some weight recently,  but she is unable to quantitate.  Remaining systems are negative.   PHYSICAL EXAMINATION:  VITAL SIGNS:  Blood pressure of 112/70 and pulse  is 60.  She weighs 150 pounds.  GENERAL:  She is well-developed, well-nourished, in no acute distress.  She does not appear to be depressed.  SKIN:  Warm and dry.  BACK:  Normal.  HEENT:  Normal.  Normal eyelids.  NECK:  Supple with a normal upstroke bilaterally.  There are no bruits  noted.  There is no jugular vein distention.  I cannot appreciate  thyromegaly.  CHEST:  Clear to auscultation.  There is no expansion.  CARDIOVASCULAR:  Regular rhythm.  Normal S1 and S2.  There are no  murmurs, rubs, or gallops noted.  ABDOMEN:   Nontender and nondistended.  Positive bowel sounds.  No  hepatosplenomegaly.  No mass appreciated.  There is no abdominal bruit.  She has 2+ femoral pulses bilaterally.  No bruits.  EXTREMITIES:  There is no peripheral clubbing.  No edema I could  palpate.  No cords.  She has 2+ dorsalis pedis pulses bilaterally.  NEUROLOGIC:  Grossly intact.   Her electrocardiogram is described above.   DIAGNOSES:  1. Hypothyroidism - the patient's TSH is markedly elevated despite      being on 88 mcg of Synthroid daily.  I did review this with her and      she is taking that medication.  I have increased her Synthroid to      125 mcg p.o. daily.  I have asked her to follow up with primary      care physician for this issue.  I did offer her to make an      appointment with an endocrinologist, but she declined and stated      she would speak with one of her friends and then make an      appointment afterwards.  I think that her hypothyroidism is      responsible for the predominance of her symptoms.  This will      include her bradycardia, fatigue, and potential weight gain.  There      maybe also contribution to her dizziness with exertion as her heart      rate is low.  2. Bradycardia - I think this is most likely due to her      hypothyroidism.  We will plan to reassess this after her Synthroid      dose has been fully adjusted.  Note, I will check an echocardiogram      to quantify left ventricular function.  3. Chest pain - her symptoms are atypical and she did rule out for      myocardial infarction.  We can plan to proceed with a stress test      after her thyroid is treated.  4. Dizziness - as per above.  5. History of depression - she will continue her Lexapro.  6. History of gastroesophageal reflux disease - she will continue on      her Nexium.   I will see her back in  approximately 6-8 weeks.  Hopefully her symptoms  will improve on the higher dose of Synthroid.  At that time, she  will  also have seen primary care physician.     [name] Jens Som, MD, [facility]  Electronically Signed    BSC/MedQ  DD: 10/16/2008  DT: 10/17/2008  Job #: [id number]

## 2011-04-02 NOTE — Op Note (Signed)
Emma Wilson, Emma Wilson              ACCOUNT NO.:  [id number]   MEDICAL RECORD NO.:  [id number]          PATIENT TYPE:  OIB   LOCATION:  5730                         FACILITY:  [facility]   PHYSICIAN:  Lebron Conners, M.D.   DATE OF BIRTH:  [birth year]   DATE OF PROCEDURE:  12/06/2005  DATE OF DISCHARGE:                                 OPERATIVE REPORT   PREOPERATIVE DIAGNOSIS:  Nodule of the right lobe of the thyroid gland.   POSTOPERATIVE DIAGNOSIS:  Follicular lesion of the thyroid gland, carcinoma  doubtful.   OPERATION PERFORMED:  Right thyroid lobectomy.   SURGEON:  Lebron Conners, M.D.   ASSISTANT:  Rose Phi. Maple Hudson, M.D.   ANESTHESIA:  General.   SPECIMENS:  Right lobe of thyroid.   ESTIMATED BLOOD LOSS:  Minimal.   COMPLICATIONS:  None.  Patient to PACU in good condition.   DESCRIPTION OF PROCEDURE:  After the patient was monitored and given general  anesthesia, had routine preparation and draping of the anterior and lateral  neck, I made a collar type incision about 5.5 cm long centered on the  midline of the neck about 2 cm above the clavicles.  I dissected down  through the platysma, then developed flaps cephalad to the thyroid cartilage  and caudad to the sternal notch and laterally to the sternocleidomastoid  muscles.  I put in a [place] self-retaining retractor and then incised the  strap muscle fascia in the midline and exposed the isthmus of the thyroid  gland.  I could feel a small fairly mobile nodule anteriorly and it seemed  to be a part of the isthmus of the thyroid rather than a lymph node.  I then  dissected laterally just anterior to the thyroid cutting a little bit of the  strap muscle which was in the way of dissection of the superior pole and  found, clipped and divided a middle thyroid vein and then clipped and  divided the superior thyroid vessels and brought the superior pole medially  and downward.  I dissected further staying very close to the  thyroid gland  dividing the inferior pole vessels as I encountered them.  I did not note  any parathyroid glands.  I did note the recurrent laryngeal nerve rising  through the tracheoesophageal groove and heading toward the larynx and I  took care to avoid injury to it.  I left a very small portion of the thyroid  gland behind near one point at which the nerve came very close to the  thyroid.  I then reflected the gland up off the trachea past midline  including the small nodule which could be palpated in isthmus.  I suture  ligated the isthmus toward the left lobe with three sutures of 3-0 Vicryl.  I sent the specimen for frozen section and report was received that this was  a follicular lesion and Dr. Quentin Ore did not feel that it was very likely to  be  malignant. I then made sure of good hemostasis and concluded the operation.  I closed the midline with running 3-0  Vicryl and closed platysma  with  running 3-0 Vicryl and closed the skin with intracuticular 4-0 Vicryl and  Steri-Strips and applied a slightly compressive bandage.      Lebron Conners, M.D.  Electronically Signed     WB/MEDQ  D:  12/06/2005  T:  12/07/2005  Job:  [id number]

## 2011-04-02 NOTE — H&P (Signed)
[facility]  Patient:    Emma Wilson, Emma Wilson                     MRN: [id number] Adm. Date:  [id number] Attending:  Trevor Iha                         History and Physical  HISTORY OF PRESENT ILLNESS:   Emma Wilson is a 54 year old, G3, P3, status post hysterectomy eleven years ago and two years ago with a left salpingo-oophorectomy for pelvic pain and left ovarian cyst, also with a history of pelvic adhesions. She has worsening pelvic pain, both on the left and the right side. Dr. Roseanne Reno had ordered a CAT scan and did an ultrasound in January which showed a 3.8 x 3.4 x 3 cm right ovary with a septated cystic structure 2.5 cm in size. She also had a right complex ovarian cyst. The patient also continued to have left sided pain, although does have a normal CAT scan and ultrasound on the left. She currently is on anti-inflammatory medications and narcotics for the pelvic pain and she presents today for definitive surgical intervention, evaluation and treatment and desires right salpingo-oophorectomy, and understands that this will make her menopausal.  PAST MEDICAL HISTORY:         Negative.  PAST SURGICAL HISTORY:        1. Hysterectomy eleven years ago.                               2. Laparoscopic bilateral tubal ligation.                               3. Left salpingo-oophorectomy and lysis of                            adhesions two years ago.  ALLERGIES:                    No known drug allergies.  ADMISSION MEDICATIONS:        1. Motrin.                               2. Vicodin.  PHYSICAL EXAMINATION: VITAL SIGNS:                  Blood pressure is 116/64.  CARDIAC:                      Regular rate and rhythm.  LUNGS:                        Clear to auscultation bilaterally.  ABDOMEN:                      Nondistended, nontender.  PELVIC:                       Mild to moderate right and left lower quadrant pelvic tenderness to  deep palpation, no rebound or guarding, no obvious masses are palpable.  IMPRESSION:                   Right ovarian cyst. Pelvic  pain with history of pelvic adhesions. The patient desires definitive surgical intervention -- desires right oophorectomy.  PLAN:                         Laparoscopic evaluation with lysis of adhesions and right salpingo-oophorectomy. The risks and benefits were discussed at length including but not limited to risk of infection, bleeding, damage to bowel, bladder, ureters, the possibility to not be able to alleviate pain or recurrence of the pain, also, discussion with regards to menopause and hormone replacement therapy. The patient gives her informed consent. DD:  01/09/01 TD:  01/09/01 Job: [id number]

## 2011-04-02 NOTE — Op Note (Signed)
Emma Wilson, Emma Wilson              ACCOUNT NO.:  [id number]   MEDICAL RECORD NO.:  [id number]          PATIENT TYPE:  OIB   LOCATION:  5705                         FACILITY:  [facility]   PHYSICIAN:  Lebron Conners, M.D.   DATE OF BIRTH:  07-10-1956   DATE OF PROCEDURE:  01/28/2006  DATE OF DISCHARGE:  01/29/2006                                 OPERATIVE REPORT   PRE-AND-POSTOPERATIVE DIAGNOSIS:  Multifocal papillary carcinoma of the  thyroid.   OPERATION:  Completion thyroidectomy (left thyroid lobectomy).   SURGEON:  Lebron Conners, M.D.   ASSISTANT:  Leonie Man, M.D.   ANESTHESIA:  General   COMPLICATIONS:  None.   BLOOD LOSS:  Minimal   SPECIMEN:  Left lobe of thyroid.   DESCRIPTION OF PROCEDURE:  After the patient was monitored and had general  anesthesia and routine preparation and draping of the anterior neck and  upper chest, I reopened the previous collar-type incision in the lower neck.  I dissected through the platysma with the Bovie, then raised  musculocutaneous flaps cephalad to the thyroid cartilage, and caudad to the  sternal notch, and laterally to the sternocleidomastoid muscles. I then  incised the strap muscles in the midline and came down upon the trachea and  noted the left lobe of the thyroid gland. The right lobe had been removed  and the isthmus had been removed. I then dissected the plane between the  left lobe of the thyroid gland and the strap muscles and pulled the gland  medially. I found a couple of veins consistent with middle thyroid veins and  clipped and divided them. I then dissected up the superior pole of the gland  and found the superior thyroid vessels and surrounded then with a 2-0 silk  ligature and ligated them and also clipped them with a medium size clip and  divided them. After completely mobilizing the superior pole, I pulled the  gland more medially, found the inferior thyroid artery, and clipped its  branches as it entered  the thyroid gland. I noted the inferior parathyroid  and spared it from harm. As I dissected more medially and back into the  tracheoesophageal groove, I noted the recurrent laryngeal nerve rising  through the tracheoesophageal groove and avoid harm to it. I brought the  gland medially and dissected it off the trachea, leaving a very tiny amount  of thyroid gland present posteriorly as the nerve came forward more to avoid  injury to it. I completed the removal of the gland, and got good hemostasis.  I irrigated the area and removed the irrigant, then packed the dissected  area with Surgicel I closed the midline with running 3-0  Vicryl, and closed the platysma with running 3-0 Vicryl, and closed the skin  with running intracuticular 4-0 Vicryl reinforced by Steri-Strips. The  patient had tolerated the operation well. Sponge, needle, and instrument  counts were correct. She went to PACU in good condition.      Lebron Conners, M.D.  Electronically Signed     WB/MEDQ  D:  01/28/2006  T:  01/30/2006  Job:  [id number]

## 2011-04-02 NOTE — Discharge Summary (Signed)
[facility]  Patient:    Emma Wilson, Emma Wilson                     MRN: [id number] Adm. Date:  [id number] Disc. Date: 01/10/01 Attending:  Trevor Iha                           Discharge Summary  HISTORY OF PRESENT ILLNESS:   [name] is a 55 year old status post hysterectomy 11 years ago who presents with worsening pelvic pain, also right ovarian complex cyst.  She has had a previous left salpingo-oophorectomy.  She presents today for laparoscopic evaluation and possible lysis of adhesions and also removal of the right tube and ovary.  HOSPITAL COURSE:              The patient underwent a laparoscopy with extensive lysis of adhesions and right salpingo-oophorectomy.  Surgery was uncomplicated.  Estimated blood loss was 10 cc.  Findings were bowel and omentum stuck to the left pelvic side wall and a right follicular cyst and hydrosalpinx to the right pelvic side wall.  Her postoperative course was unremarkable.  She did desire to stay overnight in 23 hour observation.  She had good return of ambulation and bowel function and by postoperative day #1 was tolerating a regular diet without difficulty.  Incisions were clean, dry, and intact.  She had good bowel sounds upon abdominal examination.  DISPOSITION:                  Patient will be discharged home with followup in the office in two to three weeks.  She is given a prescription for Vicodin #30, Premarin 1.25 mg p.o. q.d.  She also desired Zyban for tobacco cessation and also a note that she may return to work tomorrow. DD:  01/10/01 TD:  01/10/01 Job: [id number]

## 2011-04-02 NOTE — Op Note (Signed)
[facility]  Patient:    Emma Wilson, Emma Wilson                     MRN: [id number] Proc. Date: 01/09/01 Adm. Date:  [id number] Attending:  Trevor Iha                           Operative Report  PREOPERATIVE DIAGNOSES:       1. Right ovarian cyst.                               2. Pelvic pain.                               3. History of pelvic adhesions.  POSTOPERATIVE DIAGNOSES:      1. Right ovarian cyst.                               2. Pelvic pain.                               3. History of pelvic adhesions.  PROCEDURE:                    Laparoscopy with extensive lysis of adhesions and right salpingo-oophorectomy.  SURGEON:                      Trevor Iha, M.D.  ANESTHESIA:                   General endotracheal.  INDICATIONS:                  The patient is a 55 year old G3, P3 status post hysterectomy 11 years ago and also status post left salpingo-oophorectomy two years ago for pelvic adhesions and ovarian cyst.  She presents with worsening pelvic pain on the left and the right and a right complex cyst by ultrasound and CT scan on the right.  She presents for definitive surgical intervention and planned laparoscopic right salpingo-oophorectomy and lysis of adhesions. The risks and benefits were discussed at length and informed consent was obtained.  See the history and physical for further details.  FINDINGS AT THE TIME OF SURGERY:              Bowel and omental adhesions to the left pelvic side wall.  Right tubo-ovarian pelvic adhesions to the pelvic side wall. Right ovarian cyst and right hydrosalpinx.  Normal appearing liver and gallbladder.  DESCRIPTION OF PROCEDURE:     After adequate analgesia, the patient was placed in the dorsal lithotomy position.  She was sterilely prepped and draped.  The bladder was sterilely drained.  A sponge forceps was placed in the vagina. A 1 cm infraumbilical skin incision was made.  A  Veress needle was inserted. The abdomen was insufflated to dullness to percussion and an 11 mm trocar was inserted.  The laparoscope was inserted, showing the above findings.  To the left of midline two fingerbreadths from the pubic symphysis, a 5 mm incision was made and a trocar was inserted under direct visualization.  Lysis of adhesions along the left pelvic side wall was carried out with a combination  of bipolar cautery and sharp Endoshears, taking care to avoid major blood vessels and to control hemostasis.  Also, care was taken to avoid bowel. After extensive lysis of adhesions along the left pelvic side wall and also abdominal side wall, it appeared that the bowel and omentum had fallen free from the side wall and from the pelvis.  This was covered with INTERCEED, two sheets cut in halves were coated along the left pelvic side wall with good placement.  The right tubo-ovarian complex was sharply dissected from the pelvic side wall using Endoshears.  This was grasped with atraumatic graspers. Bipolar cautery was used to cross the infundibulopelvic ligament with care taken to avoid the major blood vessels and ureter and sharply excised using Endoshears.  This was removed after morcellation through the trocar.  The adhesions near the right appendix were also sharply dissected and hemostasis achieved with bipolar cautery, again with care taken to avoid the bowel, pelvic side wall and major blood vessels.  After a copious amount of irrigation and adequate hemostasis was ensured and it appeared that the pelvis had been restored back to normal anatomic position, trocars were removed and noted to be hemostatic.  The infraumbilical skin incision was closed with a 0 Vicryl in the fascia and a 4-0 Vicryl Rapide subcuticular stitch.  The 5 mm trocar site was closed with a 4-0 Vicryl Rapide subcuticular stitch.  The incisions were injected with 0.25% Marcaine.  The sponge forceps were remove  d from the vagina.  The patient tolerated the procedure well and was stable on transfer to the recovery room.  Sponge, needle and instrument counts were normal x 3.  The patient desires 23 hour observation, and I have placed her in the hospital for 23 hour observation to make sure that she tolerates her surgery well.  Plan on discharging to home tomorrow with a routine instruction sheet. DD:  01/09/01 TD:  01/09/01 Job: [id number]

## 2011-08-17 LAB — CK TOTAL AND CKMB (NOT AT ARMC)
CK, MB: 2.6
Relative Index: 0.9
Total CK: 304 — ABNORMAL HIGH

## 2011-08-17 LAB — CBC
MCV: 88
Platelets: 212
RBC: 4.23
WBC: 5.2

## 2011-08-17 LAB — POCT CARDIAC MARKERS
CKMB, poc: 1.4
Myoglobin, poc: 82.8
Troponin i, poc: <0.05

## 2011-08-17 LAB — POCT I-STAT, CHEM 8
Chloride: 105
Glucose, Bld: 79
HCT: 38
Hemoglobin: 12.9
Potassium: 3.6
Sodium: 141

## 2011-08-17 LAB — CARDIAC PANEL(CRET KIN+CKTOT+MB+TROPI)
CK, MB: 1.7
Relative Index: 0.8
Total CK: 215 — ABNORMAL HIGH
Troponin I: <0.01
Troponin I: <0.01

## 2011-08-17 LAB — URINALYSIS, ROUTINE W REFLEX MICROSCOPIC
Bilirubin Urine: NEGATIVE
Ketones, ur: NEGATIVE
Nitrite: NEGATIVE
Protein, ur: NEGATIVE
Specific Gravity, Urine: 1.012
Urobilinogen, UA: 0.2

## 2011-08-17 LAB — B-NATRIURETIC PEPTIDE (CONVERTED LAB): Pro B Natriuretic peptide (BNP): <30

## 2011-08-17 LAB — DIFFERENTIAL
Basophils Relative: 1
Lymphs Abs: 2.8
Monocytes Relative: 5
Neutro Abs: 1.8
Neutrophils Relative %: 35 — ABNORMAL LOW

## 2011-08-17 LAB — URINE CULTURE: Colony Count: 5000

## 2011-08-17 LAB — TROPONIN I: Troponin I: 0.01

## 2011-08-17 LAB — COMPREHENSIVE METABOLIC PANEL
Albumin: 4.1
Alkaline Phosphatase: 59
Chloride: 102
Creatinine, Ser: 1.02
GFR calc Af Amer: >60
GFR calc non Af Amer: 57 — ABNORMAL LOW
Potassium: 3.7
Sodium: 135

## 2011-08-17 LAB — URINE MICROSCOPIC-ADD ON

## 2011-08-17 LAB — MAGNESIUM: Magnesium: 2.5

## 2011-08-19 ENCOUNTER — Inpatient Hospital Stay (INDEPENDENT_AMBULATORY_CARE_PROVIDER_SITE_OTHER)
Admission: RE | Admit: 2011-08-19 | Discharge: 2011-08-19 | Disposition: A | Discharge status: Home / Self Care | Payer: 59 | Source: Ambulatory Visit | Attending: Emergency Medicine | Admitting: Emergency Medicine

## 2011-08-19 DIAGNOSIS — J40 Bronchitis, not specified as acute or chronic: Secondary | ICD-10-CM

## 2011-10-22 ENCOUNTER — Other Ambulatory Visit (HOSPITAL_COMMUNITY): Payer: Self-pay | Admitting: Internal Medicine

## 2011-10-22 DIAGNOSIS — Z1231 Encounter for screening mammogram for malignant neoplasm of breast: Secondary | ICD-10-CM

## 2011-11-11 ENCOUNTER — Ambulatory Visit (HOSPITAL_COMMUNITY): Payer: 59 | Attending: Internal Medicine

## 2011-12-13 ENCOUNTER — Other Ambulatory Visit (HOSPITAL_COMMUNITY): Payer: Self-pay | Admitting: Internal Medicine

## 2011-12-13 ENCOUNTER — Ambulatory Visit (HOSPITAL_COMMUNITY)
Admission: RE | Admit: 2011-12-13 | Discharge: 2011-12-13 | Disposition: A | Discharge status: Home / Self Care | Payer: 59 | Source: Ambulatory Visit | Attending: Internal Medicine | Admitting: Internal Medicine

## 2011-12-13 DIAGNOSIS — M25519 Pain in unspecified shoulder: Secondary | ICD-10-CM | POA: Insufficient documentation

## 2011-12-13 DIAGNOSIS — M545 Low back pain, unspecified: Secondary | ICD-10-CM | POA: Insufficient documentation

## 2011-12-13 DIAGNOSIS — M25511 Pain in right shoulder: Secondary | ICD-10-CM

## 2012-01-05 ENCOUNTER — Emergency Department (HOSPITAL_COMMUNITY)
Admission: EM | Admit: 2012-01-05 | Discharge: 2012-01-05 | Disposition: A | Discharge status: Home Health | Payer: 59 | Attending: Emergency Medicine | Admitting: Emergency Medicine

## 2012-01-05 ENCOUNTER — Emergency Department (HOSPITAL_COMMUNITY): Payer: 59

## 2012-01-05 ENCOUNTER — Encounter (HOSPITAL_COMMUNITY): Payer: Self-pay

## 2012-01-05 DIAGNOSIS — I498 Other specified cardiac arrhythmias: Secondary | ICD-10-CM | POA: Insufficient documentation

## 2012-01-05 DIAGNOSIS — J4489 Other specified chronic obstructive pulmonary disease: Secondary | ICD-10-CM | POA: Insufficient documentation

## 2012-01-05 DIAGNOSIS — Z85850 Personal history of malignant neoplasm of thyroid: Secondary | ICD-10-CM | POA: Insufficient documentation

## 2012-01-05 DIAGNOSIS — K219 Gastro-esophageal reflux disease without esophagitis: Secondary | ICD-10-CM | POA: Insufficient documentation

## 2012-01-05 DIAGNOSIS — R079 Chest pain, unspecified: Secondary | ICD-10-CM | POA: Insufficient documentation

## 2012-01-05 DIAGNOSIS — R42 Dizziness and giddiness: Secondary | ICD-10-CM | POA: Insufficient documentation

## 2012-01-05 DIAGNOSIS — Z79899 Other long term (current) drug therapy: Secondary | ICD-10-CM | POA: Insufficient documentation

## 2012-01-05 DIAGNOSIS — J449 Chronic obstructive pulmonary disease, unspecified: Secondary | ICD-10-CM | POA: Insufficient documentation

## 2012-01-05 DIAGNOSIS — F17200 Nicotine dependence, unspecified, uncomplicated: Secondary | ICD-10-CM | POA: Insufficient documentation

## 2012-01-05 DIAGNOSIS — E039 Hypothyroidism, unspecified: Secondary | ICD-10-CM | POA: Insufficient documentation

## 2012-01-05 LAB — CBC
HCT: 37.4 % (ref 36.0–46.0)
Hemoglobin: 13.2 g/dL (ref 12.0–15.0)
MCH: 28.1 pg (ref 26.0–34.0)
MCHC: 35.3 g/dL (ref 30.0–36.0)
RDW: 14.3 % (ref 11.5–15.5)

## 2012-01-05 LAB — BASIC METABOLIC PANEL
BUN: 10 mg/dL (ref 6–23)
Calcium: 9.9 mg/dL (ref 8.4–10.5)
Creatinine, Ser: 0.68 mg/dL (ref 0.50–1.10)
GFR calc Af Amer: >90 mL/min (ref >90)
GFR calc non Af Amer: >90 mL/min (ref >90)
Glucose, Bld: 78 mg/dL (ref 70–99)
Potassium: 3.4 mEq/L — ABNORMAL LOW (ref 3.5–5.1)

## 2012-01-05 LAB — CARDIAC PANEL(CRET KIN+CKTOT+MB+TROPI): Troponin I: <0.3 ng/mL (ref <0.30)

## 2012-01-05 LAB — POCT I-STAT TROPONIN I: Troponin i, poc: 0.01 ng/mL (ref 0.00–0.08)

## 2012-01-05 MED ORDER — NITROGLYCERIN 0.4 MG SL SUBL
0.4000 mg | SUBLINGUAL_TABLET | SUBLINGUAL | Status: DC | PRN
  Administered 2012-01-05: 0.4 mg via SUBLINGUAL
  Filled 2012-01-05: qty 25

## 2012-01-05 MED ORDER — ASPIRIN 325 MG PO TABS
325.0000 mg | ORAL_TABLET | ORAL | Status: AC
  Administered 2012-01-05: 325 mg via ORAL
  Filled 2012-01-05: qty 1

## 2012-01-05 NOTE — ED Notes (Signed)
Pt transferred to CDU. Report given to [facility].

## 2012-01-05 NOTE — ED Notes (Signed)
Pt to cdu awaiting 2nd troponin to be drawn and if negative, pt to be d/c'd.

## 2012-01-05 NOTE — ED Provider Notes (Addendum)
I saw and evaluated the patient, reviewed the resident's note and I agree with the findings and plan. 56-year-old, female, smoker presents emergency department complaining of chest tightness that radiates to her jaw.  It started while she was sitting.  This morning.  She denied associated nausea, vomiting, sweating, or shortness of breath.  She has not had fever, chills, cough, leg pain or swelling.  She has nitroglycerin.  However, she did not take any of it.  Upon my examination.  Her is resolved.  We'll examination is normal.  Her EKG is normal.  Her cardiac enzymes, are normal.  We will consult cardiology concerning her symptoms, to discuss the need for inpatient or outpatient evaluation.   Date: 01/05/2012  Rate: 66  Rhythm: normal sinus rhythm  QRS Axis: normal  Intervals: normal  ST/T Wave abnormalities: normal  Conduction Disutrbances: none  Narrative Interpretation:   Old EKG Reviewed:    Nicholes Stairs, MD 01/05/12 1459  3:18 PM I spoke with Dr. Tillie Rung. He rec'd one more set of ces. If neg, send home. He will arrange f/u in office.  If +, of course, admit.  Nicholes Stairs, MD 01/05/12 [phone]

## 2012-01-05 NOTE — ED Notes (Signed)
MD at bedside. 

## 2012-01-05 NOTE — ED Notes (Signed)
Pt presents with sudden onset of L sided chest pain at 1000 today.  Pt reports she had just worked last night and was getting ready for bed.  Pain has been constant and radiates into her jaw.  -shortness of breath or nausea.  Pt reports she became nervous over symptoms and broke out in hives.  Pt took TUMS with no relief.

## 2012-01-05 NOTE — ED Notes (Signed)
Pt stated pain was 4 at 1400. At 1409 NTG given SL. Pain reassessed at 1415. Pain now a 1. Pt does not want anymore NTG because of headache. Headache rated as 5/10.

## 2012-01-05 NOTE — ED Notes (Signed)
Pt presents with L sided chest pain. Onset 1000. Pt states that she was sitting in the chair getting ready to go to bed when pain started. Pt states that pain was "squeezing". Pt states at that point she began to break out in hives from anxiety.

## 2012-01-05 NOTE — Discharge Instructions (Signed)
Your ECG and blood tests do not show any signs of damage to your heart. You have not had a heart attack.  STOP smoking.   FOllow up with Dr. Jens Som for reevaluation.  Take aspirin daily.  If your chest pain recurs, take your nitroglycerin and call EMS to bring you to the ED for reevaluation.

## 2012-01-05 NOTE — ED Notes (Signed)
Dr Ranae Palms aware pt labs resulted.

## 2012-01-05 NOTE — ED Provider Notes (Signed)
History     CSN: [id number]  Arrival date & time 01/05/12  1147   First MD Initiated Contact with Patient 01/05/12 1304      Chief Complaint  Patient presents with  . Chest Pain   56 y.o.  HPI Pt presents with chest pain that started this morning.  She works the night shift and was getting ready to go to bed.  She was sitting still when it started.  Pt says she felt a squeezing chest pain and pain radiating into her jaw.  No nausea, vomiting, diaphoresis, dyspnea.  She thought at first it was heartburn and took tums.  She has nitro but did not take any.   She says the chest pain feels slightly better now, but is still a 5/10.  She denies any injuries.  She cannot remember if this feels like her prior episode of chest pain. In August of 2011, pt was admitted with chest pain and she ruled out for ACS.  She also had a NM stress test that was negative.   Past Medical History  Diagnosis Date  . Thyroid cancer   . COPD (chronic obstructive pulmonary disease)   . Depression   . GERD (gastroesophageal reflux disease)   . Sinus bradycardia   . Hypothyroidism   . Hematuria     microscopic hematuria (urol & nephrol w/u neg), multiple UTI`s    Past Surgical History  Procedure Date  . Total abdominal hysterectomy   . Thyroid surgery     Family History  Problem Relation Age of Onset  . Stroke Mother   . Coronary artery disease Mother     History  Substance Use Topics  . Smoking status: Current Some Day Smoker -- 0.5 packs/day  . Smokeless tobacco: Not on file  . Alcohol Use: No     Review of Systems  Constitutional: Negative for fever.  HENT: Negative for rhinorrhea.   Eyes: Negative for visual disturbance.  Respiratory: Negative for shortness of breath.   Cardiovascular: Positive for chest pain. Negative for palpitations.  Gastrointestinal: Negative for nausea.  Genitourinary: Negative for dysuria.  Musculoskeletal: Negative for back pain.  Skin: Negative for rash.    Neurological: Positive for light-headedness.    Allergies  Review of patient's allergies indicates no known allergies.  Home Medications   Current Outpatient Rx  Name Route Sig Dispense Refill  . VITAMIN C 500 MG PO TABS Oral Take 500 mg by mouth daily.     Marland Kitchen VITAMIN D 1000 UNITS PO TABS Oral Take 1,000 Units by mouth daily.    Marland Kitchen LEVOTHYROXINE SODIUM 137 MCG PO TABS Oral Take 137 mcg by mouth daily.      Marland Kitchen NAPROXEN 500 MG PO TABS Oral Take 500 mg by mouth 2 (two) times daily with a meal.    . PRAVASTATIN SODIUM 20 MG PO TABS Oral Take 20 mg by mouth daily.    Marland Kitchen PREGABALIN 75 MG PO CAPS Oral Take 75 mg by mouth 2 (two) times daily.    Marland Kitchen RANITIDINE HCL 150 MG PO CAPS Oral Take 150 mg by mouth daily.        BP 116/70  Pulse 65  Temp(Src) 97.8 F (36.6 C) (Oral)  Resp 16  SpO2 98%  Physical Exam  Constitutional: She is oriented to person, place, and time. She appears well-developed and well-nourished. No distress.  HENT:  Head: Normocephalic and atraumatic.  Eyes: EOM are normal. Pupils are equal, round, and reactive to light.  Neck: Normal range of motion. Neck supple. No JVD present.  Cardiovascular: Normal rate, regular rhythm, normal heart sounds and intact distal pulses.  Exam reveals no gallop and no friction rub.   No murmur heard. Pulmonary/Chest: Effort normal and breath sounds normal. She has no rales. She exhibits no tenderness.  Abdominal: Soft. Bowel sounds are normal. There is no tenderness.  Musculoskeletal: Normal range of motion. She exhibits no edema.  Neurological: She is alert and oriented to person, place, and time.  Skin: Skin is warm and dry. No rash noted.    ED Course  Procedures (including critical care time)  Date: 01/05/2012  Rate: 66  Rhythm: normal sinus rhythm  QRS Axis: normal  Intervals: normal  ST/T Wave abnormalities: normal  Conduction Disutrbances:none  Narrative Interpretation: Normal ECG.   Old EKG Reviewed: unchanged   Labs  Reviewed  BASIC METABOLIC PANEL - Abnormal; Notable for the following:    Potassium 3.4 (*)    All other components within normal limits  CBC  CARDIAC PANEL(CRET KIN+CKTOT+MB+TROPI)  TSH   Dg Chest 2 View  01/05/2012  *RADIOLOGY REPORT*  Clinical Data: 56 year old female with left chest pain and dizziness.  CHEST - 2 VIEW  Comparison: 12/19/2009 and earlier.  Findings: Stable lung volumes.  Cardiac size and mediastinal contours are within normal limits.  Visualized tracheal air column is within normal limits.  Surgical clips probably reflect previous thyroidectomy at the thoracic inlet.  No pneumothorax, pulmonary edema, pleural effusion or acute pulmonary opacity.  EKG button artifact in both upper lobes.  Stable chronic increased interstitial markings. No acute osseous abnormality identified.  IMPRESSION: No acute cardiopulmonary abnormality.  Original Report Authenticated By: Harley Hallmark, M.D.    Chest Pain   MDM  Discussed with Pt's cardiology group, who recommended getting a second set of cardiac enzymes.  If negative, will discharge home and she will follow up in their office.          Ardyth Gal, MD 01/05/12 [phone]

## 2012-01-06 NOTE — ED Provider Notes (Signed)
I saw and evaluated the patient, reviewed the resident's note and I agree with the findings and plan.  Nicholes Stairs, MD 01/06/12 [phone]

## 2012-01-07 ENCOUNTER — Ambulatory Visit: Payer: 59 | Admitting: Cardiology

## 2012-02-18 ENCOUNTER — Emergency Department (HOSPITAL_COMMUNITY)
Admission: EM | Admit: 2012-02-18 | Discharge: 2012-02-18 | Disposition: A | Discharge status: Home / Self Care | Attending: Emergency Medicine | Admitting: Emergency Medicine

## 2012-02-18 ENCOUNTER — Encounter (HOSPITAL_COMMUNITY): Payer: Self-pay | Admitting: *Deleted

## 2012-02-18 ENCOUNTER — Emergency Department (HOSPITAL_COMMUNITY)

## 2012-02-18 DIAGNOSIS — K219 Gastro-esophageal reflux disease without esophagitis: Secondary | ICD-10-CM | POA: Insufficient documentation

## 2012-02-18 DIAGNOSIS — W208XXA Other cause of strike by thrown, projected or falling object, initial encounter: Secondary | ICD-10-CM | POA: Insufficient documentation

## 2012-02-18 DIAGNOSIS — J449 Chronic obstructive pulmonary disease, unspecified: Secondary | ICD-10-CM | POA: Insufficient documentation

## 2012-02-18 DIAGNOSIS — S8990XA Unspecified injury of unspecified lower leg, initial encounter: Secondary | ICD-10-CM | POA: Insufficient documentation

## 2012-02-18 DIAGNOSIS — Z79899 Other long term (current) drug therapy: Secondary | ICD-10-CM | POA: Insufficient documentation

## 2012-02-18 DIAGNOSIS — J4489 Other specified chronic obstructive pulmonary disease: Secondary | ICD-10-CM | POA: Insufficient documentation

## 2012-02-18 DIAGNOSIS — E039 Hypothyroidism, unspecified: Secondary | ICD-10-CM | POA: Insufficient documentation

## 2012-02-18 DIAGNOSIS — S99929A Unspecified injury of unspecified foot, initial encounter: Secondary | ICD-10-CM | POA: Insufficient documentation

## 2012-02-18 DIAGNOSIS — Y9229 Other specified public building as the place of occurrence of the external cause: Secondary | ICD-10-CM | POA: Insufficient documentation

## 2012-02-18 NOTE — ED Notes (Signed)
Ortho paged and notified for ace wrap

## 2012-02-18 NOTE — ED Notes (Signed)
To ed for eval of right foot pain since a metal bar fell on it yesterday. Ambulatory with a limp.

## 2012-02-18 NOTE — Progress Notes (Signed)
Orthopedic Tech Progress Note Patient Details:  Emma Wilson 26-Feb-1956 [id number]  Other Ortho Devices Type of Ortho Device: Ace wrap Ortho Device Location: right foot Ortho Device Interventions: Application   [name] 02/18/2012, 5:24 PM

## 2012-02-18 NOTE — Progress Notes (Signed)
Orthopedic Tech Progress Note Patient Details:  Emma Wilson July 11, 1956 [id number]  Patient ID: Emma Wilson, female   DOB: [birth year], 56 y.o.   MRN: [id number] Viewed order from doctor's order list  [name] 02/18/2012, 5:24 PM

## 2012-02-18 NOTE — Discharge Instructions (Signed)
Be sure to read and understand instructions below prior to leaving the hospital.   Your xray did not show any evidence of a broken bone..    TREATMENT  Rest, ice, elevation, and compression are the basic modes of treatment.    HOME CARE INSTRUCTIONS  Apply ice to the sore area for 15 to 20 minutes, 3 to 4 times per day. Do this while you are awake for the first 2 days, or as directed. This can be stopped when the swelling goes away. Put the ice in a plastic bag and place a towel between the bag of ice and your skin.  Keep your leg elevated when possible to lessen swelling.  If your caregiver recommends crutches, use them as instructed for 1 week. Then, you may walk on your ankle weight bearing as tolerated.  You may take off your ankle stabilizer at night and to take a shower or bath. Wiggle your toes in the splint several times per day if you are able.  Do not drive a vehicle on pain medication. ACTIVITY:            - Weight bearing as tolerated            - Exercises should be limited to pain free range of motion            - Can start mobilization by tracing the alphabet with your foot in the air.       SEEK MEDICAL CARE IF:  You have an increase in bruising, swelling, or pain.  Your toes feel cold.  Pain relief is not achieved with medications.  EMERGENCY:: Your toes are numb or blue or you have severe pain.  MAKE SURE YOU:  Understand these instructions.  Will watch your condition.  Will get help right away if you are not doing well or get worse   COLD THERAPY DIRECTIONS:  Ice or gel packs can be used to reduce both pain and swelling. Ice is the most helpful within the first 24 to 48 hours after an injury or flareup from overusing a muscle or joint.  Ice is effective, has very few side effects, and is safe for most people to use.   If you expose your skin to cold temperatures for too long or without the proper protection, you can damage your skin or nerves. Watch for signs of  skin damage due to cold.   HOME CARE INSTRUCTIONS  Follow these tips to use ice and cold packs safely.  Place a dry or damp towel between the ice and skin. A damp towel will cool the skin more quickly, so you may need to shorten the time that the ice is used.  For a more rapid response, add gentle compression to the ice.  Ice for no more than 10 to 20 minutes at a time. The bonier the area you are icing, the less time it will take to get the benefits of ice.  Check your skin after 5 minutes to make sure there are no signs of a poor response to cold or skin damage.  Rest 20 minutes or more in between uses.  Once your skin is numb, you can end your treatment. You can test numbness by very lightly touching your skin. The touch should be so light that you do not see the skin dimple from the pressure of your fingertip. When using ice, most people will feel these normal sensations in this order: cold, burning, aching, and numbness.  Do not use ice on someone who cannot communicate their responses to pain, such as small children or people with dementia.   HOW TO MAKE AN ICE PACK  To make an ice pack, do one of the following:  Place crushed ice or a bag of frozen vegetables in a sealable plastic bag. Squeeze out the excess air. Place this bag inside another plastic bag. Slide the bag into a pillowcase or place a damp towel between your skin and the bag.  Mix 3 parts water with 1 part rubbing alcohol. Freeze the mixture in a sealable plastic bag. When you remove the mixture from the freezer, it will be slushy. Squeeze out the excess air. Place this bag inside another plastic bag. Slide the bag into a pillowcase or place a damp towel between your sk

## 2012-02-18 NOTE — ED Provider Notes (Signed)
History     CSN: [id number]  Arrival date & time 02/18/12  1449   First MD Initiated Contact with Patient 02/18/12 1628      Chief Complaint  Patient presents with  . Foot Pain    (Consider location/radiation/quality/duration/timing/severity/associated sxs/prior treatment) HPI Comments: Patient reports that yesterday she dropped a metal bar on her right foot while working at the [employer].  She has noticed pain and swelling of the dorsal right foot since that time.  She denies any numbness or tingling.  She reports that she is able to ambulate, but pain worse with ambulation.  Patient is a 56 y.o. female presenting with lower extremity pain. The history is provided by the patient.  Foot Pain Associated symptoms include joint swelling. Pertinent negatives include no chills or fever.    Past Medical History  Diagnosis Date  . Thyroid cancer   . COPD (chronic obstructive pulmonary disease)   . Depression   . GERD (gastroesophageal reflux disease)   . Sinus bradycardia   . Hypothyroidism   . Hematuria     microscopic hematuria (urol & nephrol w/u neg), multiple UTI`s    Past Surgical History  Procedure Date  . Total abdominal hysterectomy   . Thyroid surgery     Family History  Problem Relation Age of Onset  . Stroke Mother   . Coronary artery disease Mother     History  Substance Use Topics  . Smoking status: Current Some Day Smoker -- 0.5 packs/day  . Smokeless tobacco: Not on file  . Alcohol Use: No    OB History    Grav Para Term Preterm Abortions TAB SAB Ect Mult Living                  Review of Systems  Constitutional: Negative for fever and chills.  Musculoskeletal: Positive for joint swelling. Negative for gait problem.  Skin: Positive for color change.    Allergies  Review of patient's allergies indicates no known allergies.  Home Medications   Current Outpatient Rx  Name Route Sig Dispense Refill  . VITAMIN C 500 MG PO TABS Oral Take 500  mg by mouth daily.     Marland Kitchen VITAMIN D 1000 UNITS PO TABS Oral Take 1,000 Units by mouth daily.    Marland Kitchen DICLOFENAC SODIUM 1 % TD GEL Topical Apply 1 application topically 4 (four) times daily.    Marland Kitchen LEVOTHYROXINE SODIUM 137 MCG PO TABS Oral Take 137 mcg by mouth daily.      Marland Kitchen NAPROXEN 500 MG PO TABS Oral Take 500 mg by mouth 2 (two) times daily with a meal.    . PRAVASTATIN SODIUM 20 MG PO TABS Oral Take 20 mg by mouth daily.    Marland Kitchen PREGABALIN 75 MG PO CAPS Oral Take 75 mg by mouth 2 (two) times daily.    Marland Kitchen RANITIDINE HCL 150 MG PO CAPS Oral Take 150 mg by mouth daily.        BP 114/76  Pulse 59  Temp(Src) 97.9 F (36.6 C) (Oral)  Resp 12  SpO2 96%  Physical Exam  Nursing note and vitals reviewed. Constitutional: She appears well-developed and well-nourished. No distress.  HENT:  Head: Normocephalic and atraumatic.  Cardiovascular: Normal rate, regular rhythm and normal heart sounds.   Pulses:      Dorsalis pedis pulses are 2+ on the right side.  Pulmonary/Chest: Effort normal and breath sounds normal.  Musculoskeletal:       Right ankle: She  exhibits normal range of motion, no ecchymosis, no deformity and normal pulse. no tenderness.       Tenderness to palpation of right 1st-5th metatarsal. Mild swelling of the right dorsal foot   Neurological: She is alert. No sensory deficit.  Skin: Skin is warm and dry. She is not diaphoretic.     Psychiatric: She has a normal mood and affect.    ED Course  Procedures (including critical care time)  Labs Reviewed - No data to display Dg Foot 2 Views Right  02/18/2012  *RADIOLOGY REPORT*  Clinical Data: Foot pain post injury yesterday  RIGHT FOOT - 2 VIEW  Comparison: None.  Findings: Two views of the right foot submitted.  No acute fracture or subluxation.  Soft tissue swelling noted dorsal metatarsal region.  IMPRESSION: No acute fracture or subluxation.  Soft tissue swelling dorsal metatarsal region.  Original Report Authenticated By: Natasha Mead, M.D.     No diagnosis found.    MDM  Negative xray.  Neurovascularly intact.  Patient able to ambulate.  Patient discharged home and instructed to follow up with PCP.        Pascal Lux Desha, PA-C 02/19/12 [phone]

## 2012-02-18 NOTE — ED Notes (Signed)
Security will give pt a ride to her car.

## 2012-02-21 NOTE — ED Provider Notes (Signed)
History/physical exam/procedure(s) were performed by non-physician practitioner and as supervising physician I was immediately available for consultation/collaboration. I have reviewed all notes and am in agreement with care and plan.   Adrick Kestler S Harshini Trent, MD 02/21/12 1326 

## 2012-03-03 ENCOUNTER — Encounter (HOSPITAL_COMMUNITY): Payer: Self-pay | Admitting: Emergency Medicine

## 2012-03-10 ENCOUNTER — Emergency Department (HOSPITAL_COMMUNITY)
Admission: EM | Admit: 2012-03-10 | Discharge: 2012-03-10 | Disposition: A | Discharge status: Home / Self Care | Payer: 59 | Attending: Emergency Medicine | Admitting: Emergency Medicine

## 2012-03-10 ENCOUNTER — Encounter (HOSPITAL_COMMUNITY): Payer: Self-pay | Admitting: *Deleted

## 2012-03-10 DIAGNOSIS — W208XXA Other cause of strike by thrown, projected or falling object, initial encounter: Secondary | ICD-10-CM | POA: Insufficient documentation

## 2012-03-10 DIAGNOSIS — J4489 Other specified chronic obstructive pulmonary disease: Secondary | ICD-10-CM | POA: Insufficient documentation

## 2012-03-10 DIAGNOSIS — S9030XA Contusion of unspecified foot, initial encounter: Secondary | ICD-10-CM | POA: Insufficient documentation

## 2012-03-10 DIAGNOSIS — F17200 Nicotine dependence, unspecified, uncomplicated: Secondary | ICD-10-CM | POA: Insufficient documentation

## 2012-03-10 DIAGNOSIS — J449 Chronic obstructive pulmonary disease, unspecified: Secondary | ICD-10-CM | POA: Insufficient documentation

## 2012-03-10 DIAGNOSIS — Z85850 Personal history of malignant neoplasm of thyroid: Secondary | ICD-10-CM | POA: Insufficient documentation

## 2012-03-10 DIAGNOSIS — E039 Hypothyroidism, unspecified: Secondary | ICD-10-CM | POA: Insufficient documentation

## 2012-03-10 DIAGNOSIS — K219 Gastro-esophageal reflux disease without esophagitis: Secondary | ICD-10-CM | POA: Insufficient documentation

## 2012-03-10 HISTORY — DX: Fibromyalgia: M79.7

## 2012-03-10 NOTE — ED Notes (Signed)
Pt is on workers comp due to metal bar falling on her foot.  Pt is here for follow up as she continued to have pain.  She wants to know if she is ok to go back to work

## 2012-03-10 NOTE — ED Provider Notes (Signed)
History     CSN: [id number]  Arrival date & time 03/10/12  1133   First MD Initiated Contact with Patient 03/10/12 1250      Chief Complaint  Patient presents with  . Follow-up    (Consider location/radiation/quality/duration/timing/severity/associated sxs/prior treatment) HPI Comments: Emma Wilson is a 56 y.o. Female who is here to know if she can go back to full duty after a injury to the right foot, 2 weeks ago. At that time she was x-rayed and no fracture was found. She had been injured when a metal bar fell onto her foot. She is walking well and feels like she can wear regular shoe and wants to go back to work full-time. She does feel little knot, where the bruise was, but has only minimal pain. She has no other complaints. She denies weakness, fever, chills, nausea or vomiting.  The history is provided by the patient.    Past Medical History  Diagnosis Date  . Thyroid cancer   . COPD (chronic obstructive pulmonary disease)   . Depression   . GERD (gastroesophageal reflux disease)   . Sinus bradycardia   . Hypothyroidism   . Hematuria     microscopic hematuria (urol & nephrol w/u neg), multiple UTI`s  . Fibromyalgia     Past Surgical History  Procedure Date  . Total abdominal hysterectomy   . Thyroid surgery     Family History  Problem Relation Age of Onset  . Stroke Mother   . Coronary artery disease Mother     History  Substance Use Topics  . Smoking status: Current Some Day Smoker -- 0.5 packs/day  . Smokeless tobacco: Not on file  . Alcohol Use: No    OB History    Grav Para Term Preterm Abortions TAB SAB Ect Mult Living                  Review of Systems  All other systems reviewed and are negative.    Allergies  Review of patient's allergies indicates no known allergies.  Home Medications   Current Outpatient Rx  Name Route Sig Dispense Refill  . ACETAMINOPHEN 325 MG PO TABS Oral Take 650 mg by mouth every 6 (six) hours as needed.  For pain    . VITAMIN C 500 MG PO TABS Oral Take 500 mg by mouth daily.     Marland Kitchen VITAMIN D 1000 UNITS PO TABS Oral Take 1,000 Units by mouth daily.    Marland Kitchen DICLOFENAC SODIUM 1 % TD GEL Topical Apply 1 application topically 4 (four) times daily.    Marland Kitchen LEVOTHYROXINE SODIUM 137 MCG PO TABS Oral Take 137 mcg by mouth daily.      Marland Kitchen NAPROXEN 500 MG PO TABS Oral Take 500 mg by mouth 2 (two) times daily with a meal.    . PRAVASTATIN SODIUM 20 MG PO TABS Oral Take 10 mg by mouth daily.     Marland Kitchen PREGABALIN 75 MG PO CAPS Oral Take 75 mg by mouth 2 (two) times daily.    Marland Kitchen RANITIDINE HCL 150 MG PO CAPS Oral Take 150 mg by mouth daily.        BP 138/87  Pulse 53  Temp(Src) 98.2 F (36.8 C) (Oral)  Resp 16  SpO2 100%  Physical Exam  Constitutional: She is oriented to person, place, and time. She appears well-developed and well-nourished.  HENT:  Head: Normocephalic and atraumatic.  Eyes: Conjunctivae are normal.  Neck: Normal range of motion.  Cardiovascular:  Normal rate.   Pulmonary/Chest: Effort normal.  Musculoskeletal:       Right foot is nontender to palpation. There is minimal swelling over the right first tarsal but no deformity. She is neurovascularly intact distally in the right foot.  Neurological: She is alert and oriented to person, place, and time. No cranial nerve deficit. She exhibits normal muscle tone. Coordination normal.  Skin: Skin is warm and dry.  Psychiatric: She has a normal mood and affect. Her behavior is normal. Judgment and thought content normal.    ED Course  Procedures (including critical care time)  Labs Reviewed - No data to display No results found.   1. 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  Plan: Home Medications- OTC analgesia prn- ; Recommended follow up- return prn        Flint Melter, MD 03/10/12 1331

## 2012-03-10 NOTE — Discharge Instructions (Signed)
We are in a well fitted, and padded shoe for comfort. Use ibuprofen if needed for pain. See your doctor for problems.  Bone Bruise  A bone bruise is a small hidden fracture of the bone. It typically occurs with bones located close to the surface of the skin.  SYMPTOMS  The pain lasts longer than a normal bruise.   The bruised area is difficult to use.   There may be discoloration or swelling of the bruised area.   When a bone bruise is found with injury to the anterior cruciate ligament (in the knee) there is often an increased:   Amount of fluid in the knee   Time the fluid in the knee lasts.   Number of days until you are walking normally and regaining the motion you had before the injury.   Number of days with pain from the injury.  DIAGNOSIS  It can only be seen on X-rays known as MRIs. This stands for magnetic resonance imaging. A regular X-ray taken of a bone bruise would appear to be normal. A bone bruise is a common injury in the knee and the heel bone (calcaneus). The problems are similar to those produced by stress fractures, which are bone injuries caused by overuse. A bone bruise may also be a sign of other injuries. For example, bone bruises are commonly found where an anterior cruciate ligament (ACL) in the knee has been pulled away from the bone (ruptured). A ligament is a tough fibrous material that connects bones together to make our joints stable. Bruises of the bone last a lot longer than bruises of the muscle or tissues beneath the skin. Bone bruises can last from days to months and are often more severe and painful than other bruises. TREATMENT Because bone bruises are sudden injuries you cannot often prevent them, other than by being extremely careful. Some things you can do to improve the condition are:  Apply ice to the sore area for 15 to 20 minutes, 3 to 4 times per day while awake for the first 2 days. Put the ice in a plastic bag, and place a towel between the  bag of ice and your skin.   Keep your bruised area raised (elevated) when possible to lessen swelling.   For activity:   Use crutches when necessary; do not put weight on the injured leg until you are no longer tender.   You may walk on your affected part as the pain allows, or as instructed.   Start weight bearing gradually on the bruised part.   Continue to use crutches or a cane until you can stand without causing pain, or as instructed.   If a plaster splint was applied, wear the splint until you are seen for a follow-up examination. Rest it on nothing harder than a pillow the first 24 hours. Do not put weight on it. Do not get it wet. You may take it off to take a shower or bath.   If an air splint was applied, more air may be blown into or out of the splint as needed for comfort. You may take it off at night and to take a shower or bath.   Wiggle your toes in the splint several times per day if you are able.   You may have been given an elastic bandage to use with the plaster splint or alone. The splint is too tight if you have numbness, tingling or if your foot becomes cold and blue. Adjust  the bandage to make it comfortable.   Only take over-the-counter or prescription medicines for pain, discomfort, or fever as directed by your caregiver.   Follow all instructions for follow up with your caregiver. This includes any orthopedic referrals, physical therapy, and rehabilitation. Any delay in obtaining necessary care could result in a delay or failure of the bones to heal.  SEEK MEDICAL CARE IF:   You have an increase in bruising, swelling, or pain.   You notice coldness of your toes.   You do not get pain relief with medications.  SEEK IMMEDIATE MEDICAL CARE IF:   Your toes are numb or blue.   You have severe pain not controlled with medications.   If any of the problems that caused you to seek care are becoming worse.  Document Released: 01/22/2004 Document Revised:  10/21/2011 Document Reviewed: 06/05/2008 [facility] Patient Information 2012 [place], [place].

## 2012-03-10 NOTE — ED Notes (Signed)
Patient states onset April 5th dropped a metal bar on right foot while at work.  Patient was seen by Doctor and given an ortho boot.  Seen at occupational health who sent patient to ED for recheck.  Currently patient using ortho boot. Steady gait.  Pain currently 3/10 achy dull top of foot trace swelling present pedal pulse +2 cap refill less then 3 seconds full sensation.

## 2012-05-30 ENCOUNTER — Emergency Department (HOSPITAL_COMMUNITY)
Admission: EM | Admit: 2012-05-30 | Discharge: 2012-05-30 | Disposition: A | Discharge status: Home / Self Care | Payer: 59 | Attending: Emergency Medicine | Admitting: Emergency Medicine

## 2012-05-30 ENCOUNTER — Emergency Department (HOSPITAL_COMMUNITY): Payer: 59

## 2012-05-30 ENCOUNTER — Encounter (HOSPITAL_COMMUNITY): Payer: Self-pay | Admitting: Emergency Medicine

## 2012-05-30 DIAGNOSIS — R05 Cough: Secondary | ICD-10-CM | POA: Insufficient documentation

## 2012-05-30 DIAGNOSIS — K219 Gastro-esophageal reflux disease without esophagitis: Secondary | ICD-10-CM | POA: Insufficient documentation

## 2012-05-30 DIAGNOSIS — F3289 Other specified depressive episodes: Secondary | ICD-10-CM | POA: Insufficient documentation

## 2012-05-30 DIAGNOSIS — E039 Hypothyroidism, unspecified: Secondary | ICD-10-CM | POA: Insufficient documentation

## 2012-05-30 DIAGNOSIS — J449 Chronic obstructive pulmonary disease, unspecified: Secondary | ICD-10-CM | POA: Insufficient documentation

## 2012-05-30 DIAGNOSIS — J4489 Other specified chronic obstructive pulmonary disease: Secondary | ICD-10-CM | POA: Insufficient documentation

## 2012-05-30 DIAGNOSIS — F329 Major depressive disorder, single episode, unspecified: Secondary | ICD-10-CM | POA: Insufficient documentation

## 2012-05-30 DIAGNOSIS — C73 Malignant neoplasm of thyroid gland: Secondary | ICD-10-CM | POA: Insufficient documentation

## 2012-05-30 DIAGNOSIS — Z79899 Other long term (current) drug therapy: Secondary | ICD-10-CM | POA: Insufficient documentation

## 2012-05-30 DIAGNOSIS — R059 Cough, unspecified: Secondary | ICD-10-CM | POA: Insufficient documentation

## 2012-05-30 LAB — POCT I-STAT TROPONIN I: Troponin i, poc: 0 ng/mL (ref 0.00–0.08)

## 2012-05-30 MED ORDER — ALBUTEROL SULFATE HFA 108 (90 BASE) MCG/ACT IN AERS
2.0000 | INHALATION_SPRAY | RESPIRATORY_TRACT | Status: DC | PRN
  Administered 2012-05-30: 2 via RESPIRATORY_TRACT
  Filled 2012-05-30: qty 6.7

## 2012-05-30 NOTE — ED Notes (Signed)
The pt has been ill  Since yesterday she thinks she has bronchitis no temp no cough she feels bad.

## 2012-05-30 NOTE — ED Notes (Signed)
Pt reports SOB, reports she has coarse voice; + cough -- reports sounds wet but not getting anything up; pt lungs clear in upper bases- rhonchi in lower bases; pt does report running nose

## 2012-05-30 NOTE — ED Notes (Signed)
The pt has no complaints she is calling her family  They do not know she is here.

## 2012-05-30 NOTE — ED Provider Notes (Addendum)
History     CSN: [id number]  Arrival date & time 05/30/12  1934   None     No chief complaint on file.   (Consider location/radiation/quality/duration/timing/severity/associated sxs/prior treatment) The history is provided by the patient.    Past Medical History  Diagnosis Date  . Thyroid cancer   . COPD (chronic obstructive pulmonary disease)   . Depression   . GERD (gastroesophageal reflux disease)   . Sinus bradycardia   . Hypothyroidism   . Hematuria     microscopic hematuria (urol & nephrol w/u neg), multiple UTI`s  . Fibromyalgia     Past Surgical History  Procedure Date  . Total abdominal hysterectomy   . Thyroid surgery     Family History  Problem Relation Age of Onset  . Stroke Mother   . Coronary artery disease Mother     History  Substance Use Topics  . Smoking status: Current Everyday Smoker -- 0.5 packs/day  . Smokeless tobacco: Not on file  . Alcohol Use: No    OB History    Grav Para Term Preterm Abortions TAB SAB Ect Mult Living                  Review of Systems  Constitutional: Negative for fever and chills.  HENT: Negative for sore throat and trouble swallowing.   Respiratory: Positive for cough and shortness of breath.     Allergies  Review of patient's allergies indicates no known allergies.  Home Medications   Current Outpatient Rx  Name Route Sig Dispense Refill  . VITAMIN C 500 MG PO TABS Oral Take 500 mg by mouth daily.     Marland Kitchen VITAMIN D 1000 UNITS PO TABS Oral Take 1,000 Units by mouth daily.    Marland Kitchen DICLOFENAC SODIUM 1 % TD GEL Topical Apply 1 application topically 4 (four) times daily.    Marland Kitchen LEVOTHYROXINE SODIUM 137 MCG PO TABS Oral Take 137 mcg by mouth daily.      Marland Kitchen NAPROXEN 500 MG PO TABS Oral Take 500 mg by mouth 2 (two) times daily with a meal.    . PRAVASTATIN SODIUM 20 MG PO TABS Oral Take 10 mg by mouth daily.     Marland Kitchen PREGABALIN 75 MG PO CAPS Oral Take 150 mg by mouth 2 (two) times daily.     Marland Kitchen RANITIDINE HCL 150 MG  PO CAPS Oral Take 150 mg by mouth daily.        BP 108/71  Pulse 62  Temp 98.4 F (36.9 C) (Oral)  Resp 18  SpO2 96%  Physical Exam  Constitutional: She appears well-nourished.  Neck: Normal range of motion.  Abdominal: Soft.  Neurological: She is alert.    ED Course  Procedures (including critical care time)   Labs Reviewed  POCT I-STAT TROPONIN I   Dg Chest 2 View  05/30/2012  *RADIOLOGY REPORT*  Clinical Data: Cough, congestion and wheezing.  CHEST - 2 VIEW  Comparison: 01/05/2012  Findings: Stable chronic lung disease.  No focal consolidation is identified.  No evidence of edema or pleural fluid.  Heart size and mediastinal contours are within normal limits.  Visualized bony structures are unremarkable.  IMPRESSION: Stable chronic lung disease.  No acute findings.  Original Report Authenticated By: Reola Calkins, M.D.     1. COPD, mild     ED ECG REPORT   Date: 05/30/2012  EKG Time: 11:21 PM  Rate: 55  Rhythm: sinus bradycardia,  unchanged from previous tracings  Axis: normal  Intervals:none  ST&T Change: nonspecific T wave changes  Narrative Interpretation: stable            MDM   After review of patient's history, physical exam, and chest x-ray, feel that this is an exacerbation of her COPD have tried an albuterol inhaler, which he, states has helped her discomfort.  She was ambulated in the hall, and pulse ox was taken after that, and she was satting at 97% at this time.  She feels, better, I recommended that she followup with her primary care physician to discuss long-term treatment for her COPD as albuterol as an inhaler and short-term therapy        Arman Filter, NP 05/30/12 2321  Arman Filter, NP 06/17/12 2132  Arman Filter, NP 06/17/12 2133  Arman Filter, NP 07/10/12 2056

## 2012-05-30 NOTE — ED Notes (Signed)
Patient ambulated. O2 Sat remained at 97% consistently while ambulating.

## 2012-05-30 NOTE — ED Notes (Signed)
The pt was given a albuterol inhaler.  2 puffs given

## 2012-05-31 NOTE — ED Provider Notes (Signed)
Medical screening examination/treatment/procedure(s) were performed by non-physician practitioner and as supervising physician I was immediately available for consultation/collaboration.   Laray Anger, DO 05/31/12 1519

## 2012-06-19 NOTE — ED Provider Notes (Signed)
Medical screening examination/treatment/procedure(s) were performed by non-physician practitioner and as supervising physician I was immediately available for consultation/collaboration.   Laray Anger, DO 06/19/12 1437

## 2013-01-24 ENCOUNTER — Ambulatory Visit (INDEPENDENT_AMBULATORY_CARE_PROVIDER_SITE_OTHER): Payer: 59 | Admitting: Family Medicine

## 2013-01-24 ENCOUNTER — Encounter: Payer: Self-pay | Admitting: Family Medicine

## 2013-01-24 VITALS — BP 130/84 | HR 72 | Ht 66.0 in | Wt 141.0 lb

## 2013-01-24 DIAGNOSIS — Z79899 Other long term (current) drug therapy: Secondary | ICD-10-CM

## 2013-01-24 DIAGNOSIS — R109 Unspecified abdominal pain: Secondary | ICD-10-CM

## 2013-01-24 DIAGNOSIS — R319 Hematuria, unspecified: Secondary | ICD-10-CM | POA: Insufficient documentation

## 2013-01-24 DIAGNOSIS — R1030 Lower abdominal pain, unspecified: Secondary | ICD-10-CM

## 2013-01-24 DIAGNOSIS — IMO0001 Reserved for inherently not codable concepts without codable children: Secondary | ICD-10-CM

## 2013-01-24 DIAGNOSIS — M797 Fibromyalgia: Secondary | ICD-10-CM | POA: Insufficient documentation

## 2013-01-24 DIAGNOSIS — E039 Hypothyroidism, unspecified: Secondary | ICD-10-CM

## 2013-01-24 DIAGNOSIS — E7800 Pure hypercholesterolemia, unspecified: Secondary | ICD-10-CM

## 2013-01-24 DIAGNOSIS — M545 Low back pain: Secondary | ICD-10-CM

## 2013-01-24 DIAGNOSIS — F17200 Nicotine dependence, unspecified, uncomplicated: Secondary | ICD-10-CM

## 2013-01-24 LAB — POCT URINALYSIS DIPSTICK
Glucose, UA: NEGATIVE
Spec Grav, UA: 1.015
Urobilinogen, UA: NEGATIVE

## 2013-01-24 LAB — CBC WITH DIFFERENTIAL/PLATELET
Eosinophils Absolute: 0.2 10*3/uL (ref 0.0–0.7)
Eosinophils Relative: 5 % (ref 0–5)
Hemoglobin: 11.9 g/dL — ABNORMAL LOW (ref 12.0–15.0)
Lymphs Abs: 2 10*3/uL (ref 0.7–4.0)
MCH: 29 pg (ref 26.0–34.0)
MCV: 84.2 fL (ref 78.0–100.0)
Monocytes Relative: 7 % (ref 3–12)
RBC: 4.11 MIL/uL (ref 3.87–5.11)

## 2013-01-24 LAB — COMPREHENSIVE METABOLIC PANEL
Alkaline Phosphatase: 59 U/L (ref 39–117)
BUN: 12 mg/dL (ref 6–23)
Glucose, Bld: 66 mg/dL — ABNORMAL LOW (ref 70–99)
Total Bilirubin: 0.9 mg/dL (ref 0.3–1.2)

## 2013-01-24 LAB — LIPID PANEL
Cholesterol: 398 mg/dL — ABNORMAL HIGH (ref 0–200)
HDL: 60 mg/dL (ref >39)
Total CHOL/HDL Ratio: 6.6 Ratio

## 2013-01-24 NOTE — Patient Instructions (Signed)
Drink plenty of fluids We will be in touch with your results. If you have a urinary tract infection, you will be notifed, given antibiotics, and you must return for recheck of urine 2 weeks later. If you do NOT have an infection on culture, you will need to be set up for a CT scan to look for other causes of blood in the urine.

## 2013-01-24 NOTE — Progress Notes (Signed)
Chief Complaint  Patient presents with  . Back Pain    lbp started Friday, think she may have had a kidney stone, thinks she may have passed it yesterday.   Patient is also here to establish care, and with complaint of back pain last week. Pain was bilaterally at SI joints, and also had pain at her LUQ, and had pressure in suprapubic area whenever she voided.  Yesterday she had a lot of pain/pressure in her lower abdomen, and nausea, but today she feels much better.  She looked online, and thought this might be symptoms of a kidney stone. She has no h/o stones in the past, but had a urologic workup for hematuria which was negative.  She reports never having had cystoscopy or CT scan, but recalls having kidney biopsy.  Denies dysuria.  Abdominal pain is significantly improved today.  Review of computer shows she did have CT of abdomen/pelvis in 07/2006 in past: CT ABDOMEN: No evidence of urinary tract calculi or hydronephrosis.  CT PELVIS WITHOUT CONTRAST: No evidence of distal urinary tract calculi. Please note that CT stone study is insufficient evaluation for hematuria. If patient's hematuria persists, dedicated post contrast imaging should be strongly considered.  Hypothyroidism--she admits to forgetting to take her thyroid medication every couple of weeks. Sometimes forgets related to her fibromyalgia pain, fatigue, feeling foggy in the morning.  Hyperlipidemia--hasn't taken the pravastatin in about a year, thought she was told that she no longer needed to take it. She is fasting today. She was previously a patient of Dr. Wilhemena Durie.  Fibromyalgia--under the care of a rheumatologist (see PMH).  No old records available, but information from the computer was reviewed.   Past Medical History  Diagnosis Date  . Thyroid cancer   . COPD (chronic obstructive pulmonary disease)   . Depression   . GERD (gastroesophageal reflux disease)   . Sinus bradycardia   . Hypothyroidism   . Hematuria      microscopic hematuria (urol & nephrol w/u neg), multiple UTI`s  . Fibromyalgia   . Arthritis     Rheumatoid and OA.  sees Dr. Dareen [facility] medical, rheumatology)  . Carpal tunnel syndrome     right wrist  . Hyperlipidemia     managed by Dr. Dareen Piano   Past Surgical History  Procedure Laterality Date  . Total abdominal hysterectomy    . Thyroid surgery     History   Social History  . Marital Status: Divorced    Spouse Name: N/A    Number of Children: 2  . 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 Smoking status: Former Smoker -- 0.50 packs/day for 8 years    Quit date: 01/24/2013  . Smokeless tobacco: Not on file     Comment: starting smoking again after son died  . Alcohol Use: Yes     Comment: glass of wine every now and then.  . Drug Use: No  . Sexually Active: Yes -- Female partner(s)   Other Topics Concern  . Not on file   Social History Narrative   Lives alone.  2 living children (1 deceased), 1 in [place], 1 in [place], 6 grandchildren   Family History  Problem Relation Age of Onset  . Stroke Mother   . Coronary artery disease Mother   . Kidney disease Mother     on dialysis  . Cancer Father  bone cancer  . Arthritis Sister     rheumatoid and osteoarthritis  . Arthritis Sister     RA and OA   Current outpatient prescriptions:Ascorbic Acid (VITAMIN C) 500 MG tablet, Take 500 mg by mouth daily. , Disp: , Rfl: ;  cholecalciferol (VITAMIN D) 1000 UNITS tablet, Take 1,000 Units by mouth daily., Disp: , Rfl: ;  diclofenac sodium (VOLTAREN) 1 % GEL, Apply 1 application topically 4 (four) times daily., Disp: , Rfl: ;  levothyroxine (SYNTHROID, LEVOTHROID) 137 MCG tablet, Take 137 mcg by mouth daily.  , Disp: , Rfl:  naproxen (NAPROSYN) 500 MG tablet, Take 500 mg by mouth 2 (two) times daily with a meal., Disp: , Rfl: ;  pregabalin (LYRICA) 75 MG capsule, Take 150 mg by mouth 2  (two) times daily. , Disp: , Rfl: ;  traMADol (ULTRAM) 50 MG tablet, Take 50 mg by mouth every 6 (six) hours as needed for pain., Disp: , Rfl:   No Known Allergies  ROS:  Denies fevers, headaches, dizziness, URI symptoms, cough, nausea, vomiting, diarrhea.  Denies dysuria, incontinence, gross hematuria.  Denies vaginal discharge or bleeding.  Denies skin rashes, depression, bleeding/bruising or other concerns except per HPI.  PHYSICAL EXAM: BP 130/84  Pulse 72  Ht 5\' 6"  (1.676 m)  Wt 141 lb (63.957 kg)  BMI 22.77 kg/m2 130/84 on repeat by MD Well developed, pleasant thin female in no distress HEENT:  Conjunctiva clear, PERRL.  Neck: no lymphadenopathy, thyromegaly or carotid bruit Heart: regular rate and rhythm without murmur Lungs: clear bilaterally, but decreased breath sounds diffusely, symmetric.  No wheezes, rales or ronchi noted Back: Diffusely tender everywhere in her back, along spine and L SI joint (R is less tender).  No CVA tenderness (just diffuse tenderness superficially everywhere in back multiple trigger points).  No muscle spasm noted. Abdomen: soft. Diffusely tender across her abdomen (and everywhere, due to her fibromyalgia), but a little more tender over her bladder than other areas. No hepatomegaly or mass Extremities: no edema Neuro: alert and oriented.  Cranial nerves grossly intact.  Normal strength Psych: normal mood, affect, hygiene and grooming  Urine dip:  3+ blood, trace leukocytes  ASSESSMENT/PLAN:  LBP (low back pain) - Plan: POCT Urinalysis Dipstick  TOBACCO ABUSE - counseled regarding tobacco cessation, available options to assist her.    Suprapubic pain, unspecified laterality - Plan: Urine culture  Hematuria - Plan: Urine culture, CBC with Differential  Unspecified hypothyroidism - Plan: TSH  Pure hypercholesterolemia - Plan: Lipid panel, Comprehensive metabolic panel  Encounter for long-term (current) use of other medications - Plan:  Comprehensive metabolic panel, CBC with Differential, TSH  Fibromyalgia  Hematuria--urine culture to r/o infection.  If negative for infection, needs further workup of hematuria. Refer to urology for cystoscopy, may need CT repeated also.. If POSITIVE URINE CULTURE--Treat for UTI, but important to recheck urine to see if hematuria persists  Hyperlipidemia--off meds.  Recheck fasting labs today to determine if pravastatin (vs stronger med) needs to be restarted).  Hypothyroidism--due for labs  Tobacco use--she reportedly quit today.  Counseled regarding available free assistance, OTC agents, etc.    Release of records form signed.  Schedule CPE/pap F/u sooner per lab results.

## 2013-01-25 LAB — TSH: TSH: 124.956 u[IU]/mL — ABNORMAL HIGH (ref 0.350–4.500)

## 2013-01-26 LAB — URINE CULTURE: Organism ID, Bacteria: NO GROWTH

## 2013-01-31 ENCOUNTER — Telehealth: Payer: Self-pay | Admitting: *Deleted

## 2013-01-31 ENCOUNTER — Ambulatory Visit (INDEPENDENT_AMBULATORY_CARE_PROVIDER_SITE_OTHER): Payer: Managed Care, Other (non HMO) | Admitting: Family Medicine

## 2013-01-31 ENCOUNTER — Encounter: Payer: Self-pay | Admitting: Family Medicine

## 2013-01-31 VITALS — BP 130/96 | HR 60 | Ht 66.0 in | Wt 142.0 lb

## 2013-01-31 DIAGNOSIS — IMO0001 Reserved for inherently not codable concepts without codable children: Secondary | ICD-10-CM

## 2013-01-31 DIAGNOSIS — M797 Fibromyalgia: Secondary | ICD-10-CM

## 2013-01-31 DIAGNOSIS — E039 Hypothyroidism, unspecified: Secondary | ICD-10-CM

## 2013-01-31 DIAGNOSIS — F17200 Nicotine dependence, unspecified, uncomplicated: Secondary | ICD-10-CM

## 2013-01-31 DIAGNOSIS — E7800 Pure hypercholesterolemia, unspecified: Secondary | ICD-10-CM

## 2013-01-31 DIAGNOSIS — R319 Hematuria, unspecified: Secondary | ICD-10-CM

## 2013-01-31 MED ORDER — SYNTHROID 137 MCG PO TABS: 137.0000 ug | ORAL_TABLET | Freq: Every day | ORAL | Status: DC

## 2013-01-31 NOTE — Telephone Encounter (Signed)
noted 

## 2013-01-31 NOTE — Progress Notes (Signed)
Chief Complaint  Patient presents with  . Follow-up    lab results.   Patient presents to review lab results.  She was asked to return, as honesty/compliance questioned after review of results didn't coincide with history given at visit.  She states that she was too embarrassed to tell the truth.  She admits that "most of the time I'm so drugged out, I don't remember what I do".  She describes herself as being very groggy, and sometimes doesn't even know what day it is.  She takes meds (Lyrica) every 6 hours instead of every 12 hours, which makes it a little better.  Admits that she hasn't been taking her Synthroid, and even lost her bottle of thyroid medication.  She found a pill box yesterday so took one yesterday, but otherwise really can't remember last time she took thyroid med.  Started back smoking again this past weekend. Continues to have chronic pain.  Persistent low back pain.  Only taking the naproxen once daily, not daily.  Past Medical History  Diagnosis Date  . Thyroid cancer   . COPD (chronic obstructive pulmonary disease)   . Depression   . GERD (gastroesophageal reflux disease)   . Sinus bradycardia   . Hypothyroidism   . Hematuria     microscopic hematuria (urol & nephrol w/u neg), multiple UTI`s  . Fibromyalgia   . Arthritis     Rheumatoid and OA.  sees Dr. Dareen Piano [facility] medical, rheumatology)  . Carpal tunnel syndrome     right wrist  . Hyperlipidemia     managed by Dr. Dareen Piano   Past Surgical History  Procedure Laterality Date  . Total abdominal hysterectomy    . Thyroid surgery     History   Social History  . Marital Status: Divorced    Spouse Name: N/A    Number of Children: 2  . 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 Smoking status: Current Every Day Smoker -- 0.50 packs/day for 8 years    Types: Cigarettes    Last Attempt to Quit: 01/24/2013  . Smokeless tobacco:  Not on file     Comment: starting smoking again after son died  . Alcohol Use: Yes     Comment: glass of wine every now and then.  . Drug Use: No  . Sexually Active: Yes -- Female partner(s)   Other Topics Concern  . Not on file   Social History Narrative   Lives alone.  2 living children (1 deceased), 1 in [place], 1 in [place], 6 grandchildren   Current Outpatient Prescriptions on File Prior to Visit  Medication Sig Dispense Refill  . Ascorbic Acid (VITAMIN C) 500 MG tablet Take 500 mg by mouth daily.       . cholecalciferol (VITAMIN D) 1000 UNITS tablet Take 1,000 Units by mouth daily.      . diclofenac sodium (VOLTAREN) 1 % GEL Apply 1 application topically 4 (four) times daily.      . naproxen (NAPROSYN) 500 MG tablet Take 500 mg by mouth 2 (two) times daily with a meal.      . pregabalin (LYRICA) 75 MG capsule Take 150 mg by mouth 2 (two) times daily.       . traMADol (ULTRAM) 50 MG tablet Take 50 mg by mouth every 6 (six) hours as needed for pain.       No current facility-administered medications  on file prior to visit.   No Known Allergies  ROS:  Denies fevers, URI symptoms, headaches, chest pain, shortness of breath.  +fatigue, confusion, poor memory.  Denies bowel changes, weight changes, bleeding/bruising, edema, skin lesions or other concerns except as per HPI.  PHYSICAL EXAM: BP 130/96  Pulse 60  Ht 5\' 6"  (1.676 m)  Wt 142 lb (64.411 kg)  BMI 22.93 kg/m2 Well developed, thin female, in no distress.  She is alert and oriented. Remainder of visit was spent reviewing labs, counseling.  Lab Results  Component Value Date   CHOL 398* 01/24/2013   HDL 60 01/24/2013   LDLCALC 320* 01/24/2013   TRIG 90 01/24/2013   CHOLHDL 6.6 01/24/2013   Lab Results  Component Value Date   TSH 124.956* 01/24/2013     Chemistry      Component Value Date/Time   NA 141 01/24/2013 1150   K 3.8 01/24/2013 1150   CL 103 01/24/2013 1150   CO2 29 01/24/2013 1150   BUN 12 01/24/2013 1150    CREATININE 1.17* 01/24/2013 1150   CREATININE 0.68 01/05/2012 1326      Component Value Date/Time   CALCIUM 9.1 01/24/2013 1150   ALKPHOS 59 01/24/2013 1150   AST 28 01/24/2013 1150   ALT 14 01/24/2013 1150   BILITOT 0.9 01/24/2013 1150     Lab Results  Component Value Date   WBC 4.2 01/24/2013   HGB 11.9* 01/24/2013   HCT 34.6* 01/24/2013   MCV 84.2 01/24/2013   PLT 224 01/24/2013   Urine culture showed no growth.  ASSESSMENT/PLAN: Unspecified hypothyroidism - noncompliant with meds.  discussed risks/symptoms of untreated hypothyroidism, including fatigue, poor memory (symptoms she is blaming on lyrica) - Plan: SYNTHROID 137 MCG tablet, TSH  Pure hypercholesterolemia - at least somewhat related to untreated hypothyroidism.  recheck lipids after compliant with meds, to determine if pravastatin needs to be restarted, vs stronger - Plan: Lipid panel  Hematuria - urine culture negative.  smoker--needs further eval (ie cystoscopy).  refer to urology - Plan: Ambulatory referral to Urology  TOBACCO ABUSE - encouraged cessation, reviewed free counseling available  Fibromyalgia - per rheum.  f/u with Dr. Dareen Piano if having side effects from meds  Hypothyroidism--discussed what thyroid does for body, that some of her fatigue and trouble thinking may be DUE to not taking thyroid med, not just side effects of other meds  Hyperlipidemia--likely will need meds, but get thyroid treated first then reassess, to determine which medication/dose is needed.  Discussed importance of compliance and honesty, or else we will not have a good relationship (and she may be discharged).  25 min visit, all counseling

## 2013-01-31 NOTE — Telephone Encounter (Signed)
Just wanted to let you know that I called over to [facility] to schedule patient referral and they advised me that Emma Wilson would have to call over there and ask for [facility] before they would schedule her. I informed patient and asked her to please call me back once she calls there and gets things straightened out. Just an FYI.

## 2013-01-31 NOTE — Patient Instructions (Signed)
We are referring you to urology for evaluation of blood in the urine. Please work on quitting smoking, as we discussed last time.  1-800-QUITNOW and [place]uit[facility] are resources for free counseling.  It is very important that you take your thyroid pill EVERY DAY.  Take it on an empty stomach, with no other vitamins or medications for at least 30-60 min. Once we get thyroid back to normal, we then need to reassess your cholesterol.  Cholesterol was VERY high, but affected by underactive thyroid.  Follow a low cholesterol diet  Lab Results  Component Value Date   CHOL 398* 01/24/2013   HDL 60 01/24/2013   LDLCALC 320* 01/24/2013   TRIG 90 01/24/2013   CHOLHDL 6.6 01/24/2013   Lab Results  Component Value Date   TSH 124.956* 01/24/2013   Fat and Cholesterol Control Diet Cholesterol levels in your body are determined significantly by your diet. Cholesterol levels may also be related to heart disease. The following material helps to explain this relationship and discusses what you can do to help keep your heart healthy. Not all cholesterol is bad. Low-density lipoprotein (LDL) cholesterol is the "bad" cholesterol. It may cause fatty deposits to build up inside your arteries. High-density lipoprotein (HDL) cholesterol is "good." It helps to remove the "bad" LDL cholesterol from your blood. Cholesterol is a very important risk factor for heart disease. Other risk factors are high blood pressure, smoking, stress, heredity, and weight. The heart muscle gets its supply of blood through the coronary arteries. If your LDL cholesterol is high and your HDL cholesterol is low, you are at risk for having fatty deposits build up in your coronary arteries. This leaves less room through which blood can flow. Without sufficient blood and oxygen, the heart muscle cannot function properly and you may feel chest pains (angina pectoris). When a coronary artery closes up entirely, a part of the heart muscle may die causing a  heart attack (myocardial infarction). CHECKING CHOLESTEROL When your caregiver sends your blood to a lab to be examined for cholesterol, a complete lipid (fat) profile may be done. With this test, the total amount of cholesterol and levels of LDL and HDL are determined. Triglycerides are a type of fat that circulates in the blood. They can also be used to determine heart disease risk. The list below describes what the numbers should be: Test: Total Cholesterol.  Less than 200 mg/dl. Test: LDL "bad cholesterol."  Less than 100 mg/dl.  Less than 70 mg/dl if you are at very high risk of a heart attack or sudden cardiac death. Test: HDL "good cholesterol."  Greater than 50 mg/dl for women.  Greater than 40 mg/dl for men. Test: Triglycerides.  Less than 150 mg/dl. CONTROLLING CHOLESTEROL WITH DIET Although exercise and lifestyle factors are important, your diet is key. That is because certain foods are known to raise cholesterol and others to lower it. The goal is to balance foods for their effect on cholesterol and more importantly, to replace saturated and trans fat with other types of fat, such as monounsaturated fat, polyunsaturated fat, and omega-3 fatty acids. On average, a person should consume no more than 15 to 17 g of saturated fat daily. Saturated and trans fats are considered "bad" fats, and they will raise LDL cholesterol. Saturated fats are primarily found in animal products such as meats, butter, and cream. However, that does not mean you need to give up all your favorite foods. Today, there are good tasting, low-fat, low-cholesterol substitutes  for most of the things you like to eat. Choose low-fat or nonfat alternatives. Choose round or loin cuts of red meat. These types of cuts are lowest in fat and cholesterol. Chicken (without the skin), fish, veal, and ground [place] breast are great choices. Eliminate fatty meats, such as hot dogs and salami. Even shellfish have little or no  saturated fat. Have a 3 oz (85 g) portion when you eat lean meat, poultry, or fish. Trans fats are also called "partially hydrogenated oils." They are oils that have been scientifically manipulated so that they are solid at room temperature resulting in a longer shelf life and improved taste and texture of foods in which they are added. Trans fats are found in stick margarine, some tub margarines, cookies, crackers, and baked goods.  When baking and cooking, oils are a great substitute for butter. The monounsaturated oils are especially beneficial since it is believed they lower LDL and raise HDL. The oils you should avoid entirely are saturated tropical oils, such as coconut and palm.  Remember to eat a lot from food groups that are naturally free of saturated and trans fat, including fish, fruit, vegetables, beans, grains (barley, rice, couscous, bulgur wheat), and pasta (without cream sauces).  IDENTIFYING FOODS THAT LOWER CHOLESTEROL  Soluble fiber may lower your cholesterol. This type of fiber is found in fruits such as apples, vegetables such as broccoli, potatoes, and carrots, legumes such as beans, peas, and lentils, and grains such as barley. Foods fortified with plant sterols (phytosterol) may also lower cholesterol. You should eat at least 2 g per day of these foods for a cholesterol lowering effect.  Read package labels to identify low-saturated fats, trans fat free, and low-fat foods at the supermarket. Select cheeses that have only 2 to 3 g saturated fat per ounce. Use a heart-healthy tub margarine that is free of trans fats or partially hydrogenated oil. When buying baked goods (cookies, crackers), avoid partially hydrogenated oils. Breads and muffins should be made from whole grains (whole-wheat or whole oat flour, instead of "flour" or "enriched flour"). Buy non-creamy canned soups with reduced salt and no added fats.  FOOD PREPARATION TECHNIQUES  Never deep-fry. If you must fry, either  stir-fry, which uses very little fat, or use non-stick cooking sprays. When possible, broil, bake, or roast meats, and steam vegetables. Instead of putting butter or margarine on vegetables, use lemon and herbs, applesauce, and cinnamon (for squash and sweet potatoes), nonfat yogurt, salsa, and low-fat dressings for salads.  LOW-SATURATED FAT / LOW-FAT FOOD SUBSTITUTES Meats / Saturated Fat (g)  Avoid: Steak, marbled (3 oz/85 g) / 11 g  Choose: Steak, lean (3 oz/85 g) / 4 g  Avoid: Hamburger (3 oz/85 g) / 7 g  Choose: Hamburger, lean (3 oz/85 g) / 5 g  Avoid: Ham (3 oz/85 g) / 6 g  Choose: Ham, lean cut (3 oz/85 g) / 2.4 g  Avoid: Chicken, with skin, dark meat (3 oz/85 g) / 4 g  Choose: Chicken, skin removed, dark meat (3 oz/85 g) / 2 g  Avoid: Chicken, with skin, light meat (3 oz/85 g) / 2.5 g  Choose: Chicken, skin removed, light meat (3 oz/85 g) / 1 g Dairy / Saturated Fat (g)  Avoid: Whole milk (1 cup) / 5 g  Choose: Low-fat milk, 2% (1 cup) / 3 g  Choose: Low-fat milk, 1% (1 cup) / 1.5 g  Choose: Skim milk (1 cup) / 0.3 g  Avoid: Hard cheese (  1 oz/28 g) / 6 g  Choose: Skim milk cheese (1 oz/28 g) / 2 to 3 g  Avoid: Cottage cheese, 4% fat (1 cup) / 6.5 g  Choose: Low-fat cottage cheese, 1% fat (1 cup) / 1.5 g  Avoid: Ice cream (1 cup) / 9 g  Choose: Sherbet (1 cup) / 2.5 g  Choose: Nonfat frozen yogurt (1 cup) / 0.3 g  Choose: Frozen fruit bar / trace  Avoid: Whipped cream (1 tbs) / 3.5 g  Choose: Nondairy whipped topping (1 tbs) / 1 g Condiments / Saturated Fat (g)  Avoid: Mayonnaise (1 tbs) / 2 g  Choose: Low-fat mayonnaise (1 tbs) / 1 g  Avoid: Butter (1 tbs) / 7 g  Choose: Extra light margarine (1 tbs) / 1 g  Avoid: Coconut oil (1 tbs) / 11.8 g  Choose: Olive oil (1 tbs) / 1.8 g  Choose: Corn oil (1 tbs) / 1.7 g  Choose: Safflower oil (1 tbs) / 1.2 g  Choose: Sunflower oil (1 tbs) / 1.4 g  Choose: Soybean oil (1 tbs) / 2.4 g  Choose:  Canola oil (1 tbs) / 1 g Document Released: 11/01/2005 Document Revised: 01/24/2012 Document Reviewed: 04/22/2011 [facility] Patient Information 2013 [place], [place].

## 2013-02-01 IMAGING — CR DG FOOT 2V*R*
2 series · 2 of 2 positions shown · non-contrast
Comparison: None.

CLINICAL DATA: Foot pain post injury yesterday

RIGHT FOOT - 2 VIEW

[x foot ap right]
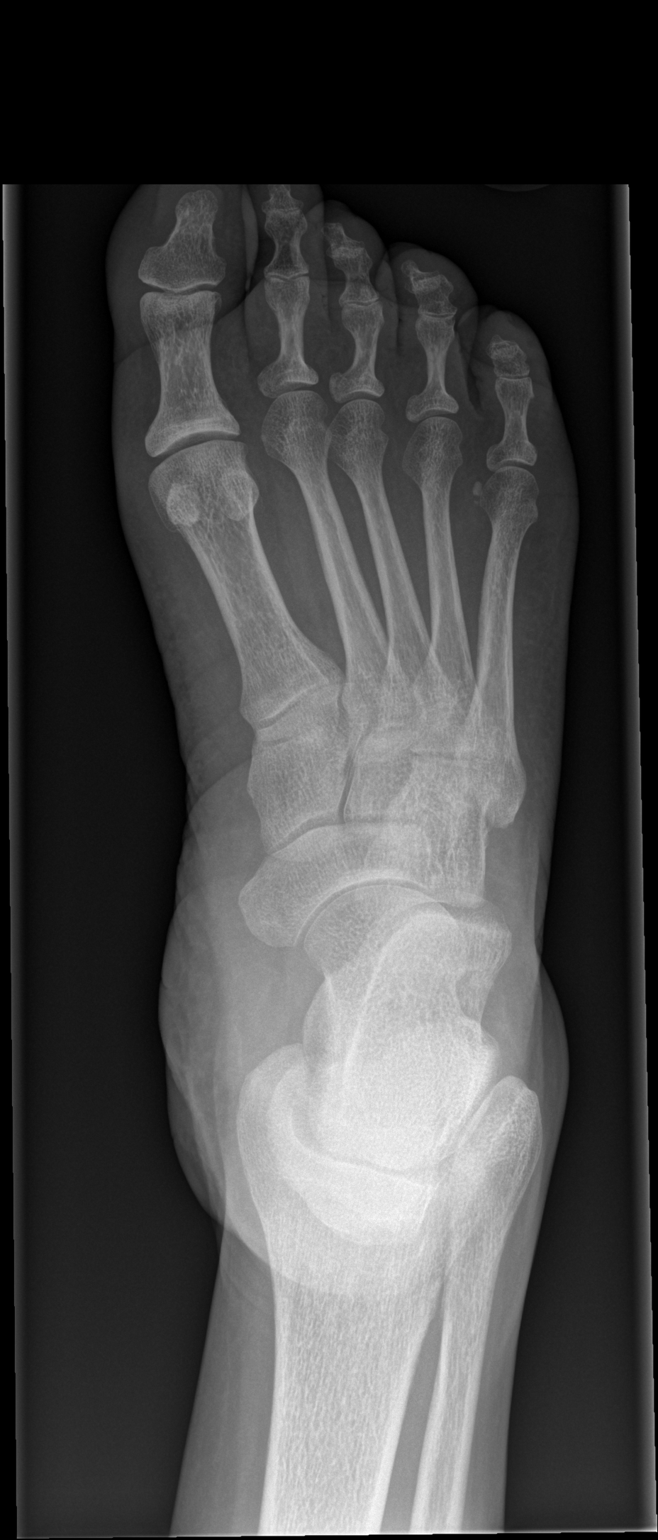

[x foot lat right]
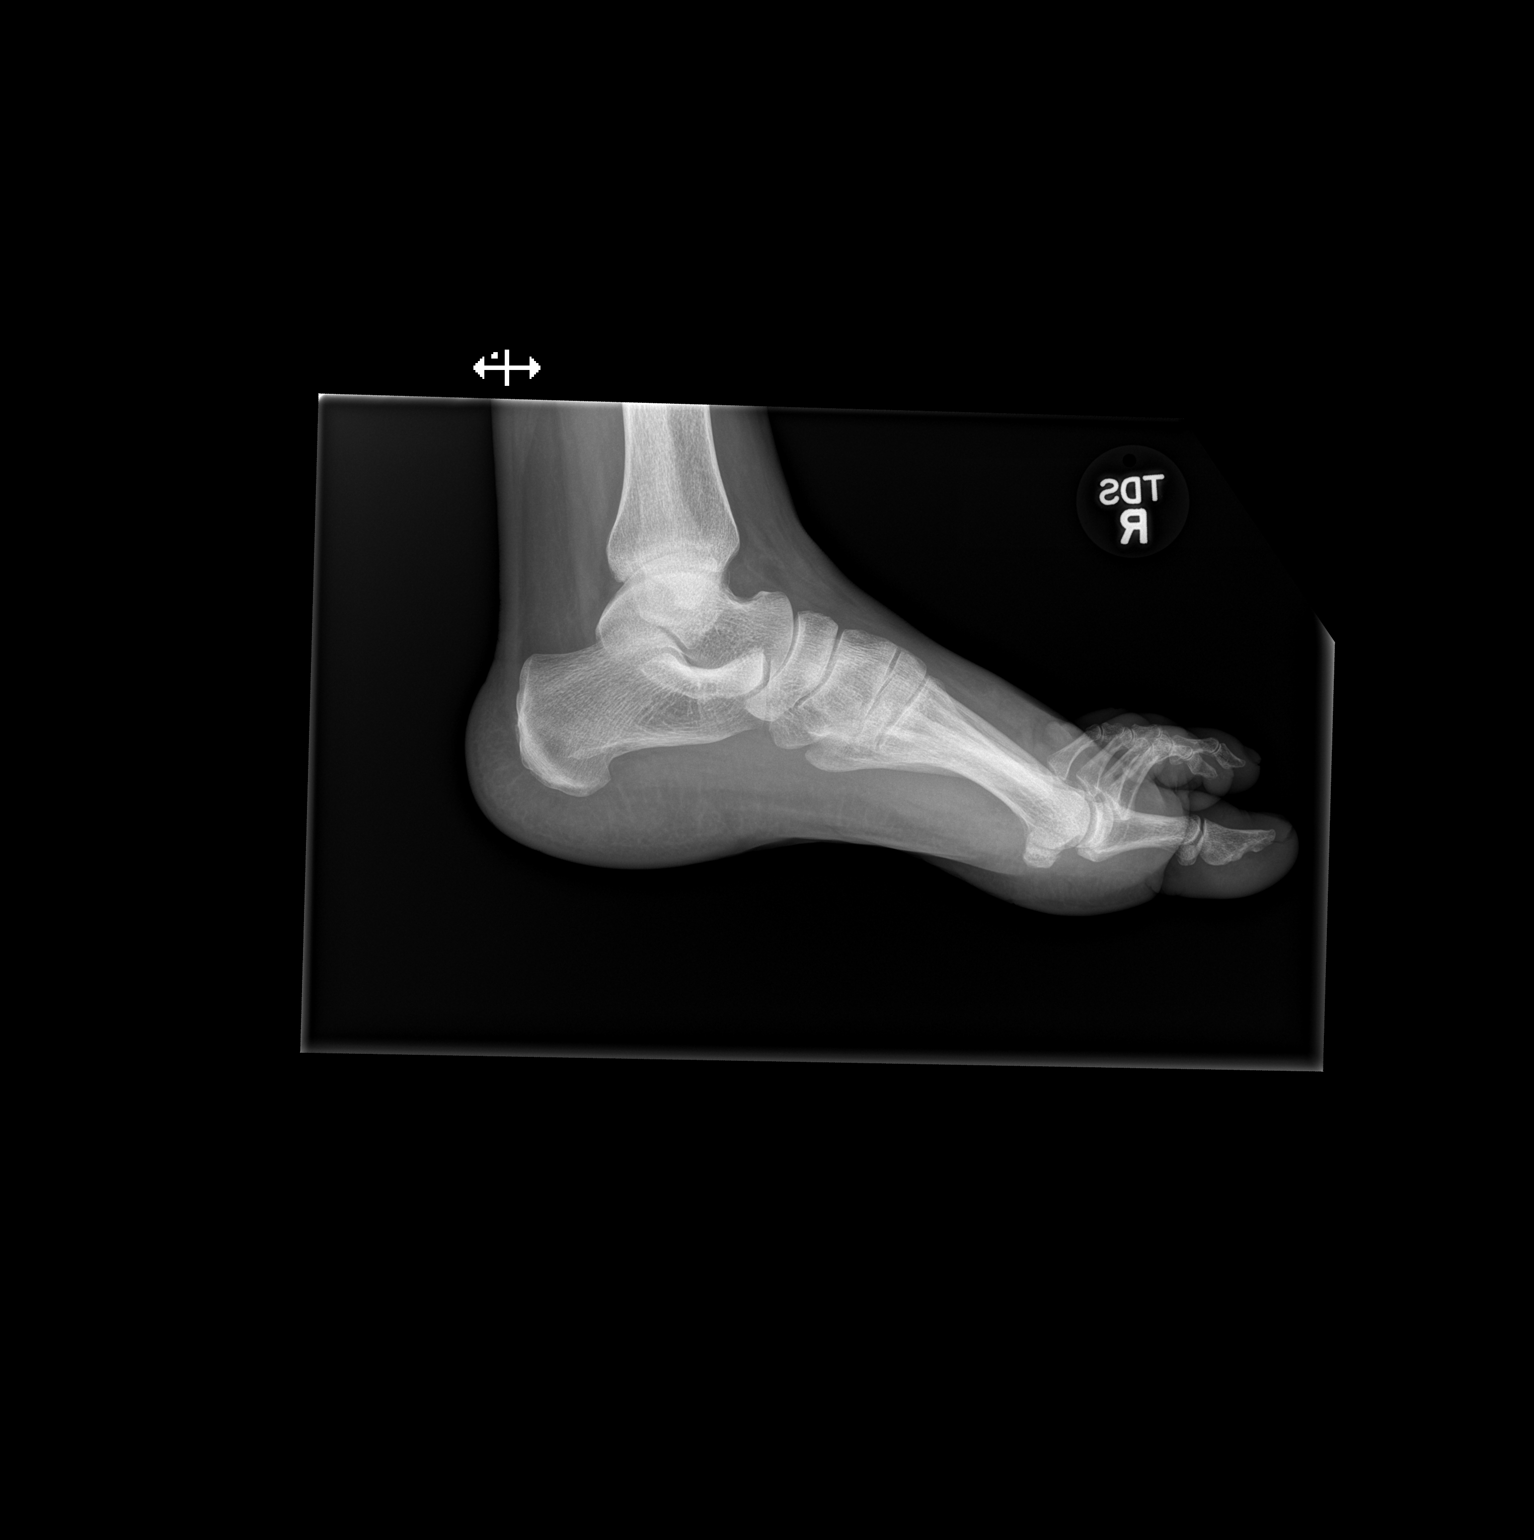

[2 of 2 positions shown; findings below may reference images not displayed]

FINDINGS: Two views of the right foot submitted.  No acute fracture
or subluxation.  Soft tissue swelling noted dorsal metatarsal
region.
IMPRESSION: No acute fracture or subluxation.  Soft tissue swelling dorsal
metatarsal region.

## 2013-02-05 ENCOUNTER — Telehealth: Payer: Self-pay | Admitting: Family Medicine

## 2013-02-05 NOTE — Telephone Encounter (Signed)
THIS PT CALLED AND WANTED YOU TO KNOW THAT SHE HAS AN APPT WITH DR. Annabell Howells ON  02/26/2013.

## 2013-02-15 ENCOUNTER — Telehealth: Payer: Self-pay | Admitting: Family Medicine

## 2013-02-15 NOTE — Telephone Encounter (Signed)
RCD req from [facility] for labs, ov, xrays   Faxed to [phone]

## 2013-02-26 HISTORY — PX: CYSTOSCOPY: SHX5120

## 2013-02-28 ENCOUNTER — Encounter: Payer: Self-pay | Admitting: Internal Medicine

## 2013-03-01 ENCOUNTER — Encounter: Payer: Self-pay | Admitting: Family Medicine

## 2013-03-14 ENCOUNTER — Other Ambulatory Visit: Payer: Managed Care, Other (non HMO)

## 2013-03-28 ENCOUNTER — Other Ambulatory Visit: Payer: Self-pay | Admitting: Family Medicine

## 2013-03-28 ENCOUNTER — Encounter: Payer: Self-pay | Admitting: Family Medicine

## 2013-03-28 ENCOUNTER — Ambulatory Visit (INDEPENDENT_AMBULATORY_CARE_PROVIDER_SITE_OTHER): Payer: Managed Care, Other (non HMO) | Admitting: Family Medicine

## 2013-03-28 VITALS — BP 120/80 | HR 60 | Ht 66.0 in | Wt 137.0 lb

## 2013-03-28 DIAGNOSIS — M25519 Pain in unspecified shoulder: Secondary | ICD-10-CM

## 2013-03-28 DIAGNOSIS — M25511 Pain in right shoulder: Secondary | ICD-10-CM

## 2013-03-28 DIAGNOSIS — M797 Fibromyalgia: Secondary | ICD-10-CM

## 2013-03-28 DIAGNOSIS — E039 Hypothyroidism, unspecified: Secondary | ICD-10-CM

## 2013-03-28 DIAGNOSIS — IMO0001 Reserved for inherently not codable concepts without codable children: Secondary | ICD-10-CM

## 2013-03-28 NOTE — Progress Notes (Signed)
Chief Complaint  Patient presents with  . Shoulder Pain    x 1 year. Right shoulder pain has worsened in the last week. Radiates up in to her neck area. A refiil request came over a few minutes ago for patient's Synthroid. She missed lab appt back in March.    She noticed a "knot" at the front of her right shoulder, which is a little tender.  Arms are "always tired".  Noticed the knot in the last week.  She is having soreness and pain in her right upper arm, in bicep area, "always tender". 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  She is under the care of Dr. Dareen Piano for fibromyalgia, osteo and rheumatoid arthritis.  She has been using Voltaren gel (uses it in lots of places) chronically without benefit.  Only uses naproxen once daily.  She is on lyrica, but it is so expensive she doesn't take it as directed, tries to "stretch it out".   "I got a little buzz going on here today", from having taken tramadol  Hypothyroidism:  She reports being compliant with taking the medication, and taking it properly.  She missed her f/u appointment.  Was due to have thyroid and lipids rechecked.  Not fasting today.  Past Medical History  Diagnosis Date  . Thyroid cancer   . COPD (chronic obstructive pulmonary disease)   . Depression   . GERD (gastroesophageal reflux disease)   . Sinus bradycardia   . Hypothyroidism   . Hematuria     microscopic hematuria (urol & nephrol w/u neg), multiple UTI`s  . Fibromyalgia   . Arthritis     Rheumatoid and OA.  sees Dr. Dareen Piano [facility], rheumatology)  . Carpal tunnel syndrome     right wrist  . Hyperlipidemia     managed by Dr. Dareen Piano  . Microscopic hematuria     glomerular from sickle cell trait (per Dr. Annabell Howells 02/2013)   Past Surgical History  Procedure Laterality Date  . Total abdominal hysterectomy    . Thyroid surgery    . Cystoscopy  02/26/2013    Dr. Annabell Howells (normal; follicular cystitis)   History   Social History  . Marital Status:  Divorced    Spouse Name: N/A    Number of Children: 2  . 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 Smoking status: Current Every Day Smoker -- 0.50 packs/day for 8 years    Types: Cigarettes    Last Attempt to Quit: 01/24/2013  . Smokeless tobacco: Not on file     Comment: starting smoking again after son died  . Alcohol Use: Yes     Comment: glass of wine every now and then.  . Drug Use: No  . Sexually Active: Yes -- Female partner(s)   Other Topics Concern  . Not on file   Social History Narrative   Lives alone.  2 living children (1 deceased), 1 in [place], 1 in [place], 6 grandchildren   Current outpatient prescriptions:Ascorbic Acid (VITAMIN C) 500 MG tablet, Take 500 mg by mouth daily. , Disp: , Rfl: ;  cholecalciferol (VITAMIN D) 1000 UNITS tablet, Take 1,000 Units by mouth daily., Disp: , Rfl: ;  diclofenac sodium (VOLTAREN) 1 % GEL, Apply 1 application topically 4 (four) times daily., Disp: , Rfl: ;  Multiple Vitamin (MULTIVITAMIN) tablet, Take 1 tablet by mouth daily., Disp: , Rfl:  Multiple Vitamins-Minerals (  HAIR/SKIN/NAILS PO), Take 2 capsules by mouth daily., Disp: , Rfl: ;  naproxen (NAPROSYN) 500 MG tablet, Take 500 mg by mouth 2 (two) times daily with a meal., Disp: , Rfl: ;  pregabalin (LYRICA) 75 MG capsule, Take 150 mg by mouth 2 (two) times daily. , Disp: , Rfl: ;  SYNTHROID 137 MCG tablet, Take 1 tablet (137 mcg total) by mouth daily., Disp: 30 tablet, Rfl: 1 traMADol (ULTRAM) 50 MG tablet, Take 50 mg by mouth every 6 (six) hours as needed for pain., Disp: , Rfl:   No Known Allergies  ROS:  Denies fevers, URI or allergy symptoms, headaches, dizziness, chest pain, bleeding, bruising, hair/skin/weight changes, edema, abdominal pain or other concerns.  See HPI.  PHYSICAL EXAM: BP 120/80  Pulse 60  Ht 5\' 6"  (1.676 m)  Wt 137 lb (62.143 kg)  BMI 22.12 kg/m2 Pleasant female, in no  distress, seeming somewhat spacy, in no distress HEENT:  Conjunctiva clear. PERRL Neck: spine nontender. Diffuse tenderness over R trapezius muscle with various tender points.  Tender over right anterior shoulder.  No discrete knot or mass is palpable.  I think the area that she is concerned about is a muscle/tendon, felt only in certain positions.  She has mild pain in the upper arm with all rotator cuff testing--no pain in the shoulder itself.  FROM, and normal strength, no weakness or evidence of tear or impingement.  Normal sensation.  ASSESSMENT/PLAN:  Shoulder pain, right - likely tendonitis, overuse. no evidence of tear or weakness. may be related to her fibromyalgia.  Fibromyalgia - pain in R trapezius, and diffusely in right upper arm today  Unspecified hypothyroidism - noncompliant with meds.  discussed risks/symptoms of untreated hypothyroidism, including fatigue, poor memory (symptoms she is blaming on lyrica) - Plan: TSH   Trial of heat and/or ice.  Try and refrain from heavy lifting with right arm (relative rest).  Try increasing the naproxen to twice daily with food.  Use the voltaren only as directed (not diffusely over all aches in the body).  Follow up with Dr. Dareen Piano if having ongoing pain in right shoulder--?if you would benefit from a cortisone shot.  Recheck TSH today.  Euthyroid by history, but patient with only mild symptoms when TSH very elevated at last check. Hold off on checking lipids today since she isn't fasting.  If thyroid is abnormal, adjust dose and plan to do lipids on recheck.  If TSH is normal, then pt can return fasting at her convenience for a lipid panel

## 2013-03-28 NOTE — Patient Instructions (Signed)
Trial of heat and/or ice.  Try and refrain from heavy lifting with right arm (relative rest).  Try increasing the naproxen to twice daily with food.  Use the voltaren only as directed (not diffusely over all aches in the body).  Follow up with Dr. Dareen Piano if having ongoing pain in right shoulder--?if you would benefit from a cortisone shot.  We will contact you with your thyroid results. If the thyroid dose you are on is correct, then we will plan on having you return for fasting lipid panel (cholesterol check).  If your thyroid dose needs to be adjusted, we will plan on doing the cholesterol check with the recheck of your thyroid after dose is changed.

## 2013-03-29 ENCOUNTER — Other Ambulatory Visit: Payer: Self-pay | Admitting: *Deleted

## 2013-03-29 DIAGNOSIS — E039 Hypothyroidism, unspecified: Secondary | ICD-10-CM

## 2013-03-29 LAB — TSH: TSH: 15.944 u[IU]/mL — ABNORMAL HIGH (ref 0.350–4.500)

## 2013-03-29 MED ORDER — SYNTHROID 150 MCG PO TABS: 150.0000 ug | ORAL_TABLET | Freq: Every day | ORAL | Status: DC

## 2013-04-04 ENCOUNTER — Encounter: Payer: Self-pay | Admitting: Family Medicine

## 2013-04-04 ENCOUNTER — Telehealth: Payer: Self-pay | Admitting: Family Medicine

## 2013-04-04 NOTE — Telephone Encounter (Signed)
Pt called and asked that we give her a note that shows her chronic illness of Thyroid Cancer and hypothyroidism.  Call when ready [phone].

## 2013-05-16 ENCOUNTER — Other Ambulatory Visit: Payer: Self-pay

## 2013-06-21 ENCOUNTER — Encounter: Payer: Self-pay | Admitting: *Deleted

## 2013-06-21 ENCOUNTER — Other Ambulatory Visit: Payer: Self-pay | Admitting: Family Medicine

## 2013-07-02 ENCOUNTER — Other Ambulatory Visit: Payer: Managed Care, Other (non HMO)

## 2013-07-02 DIAGNOSIS — E7800 Pure hypercholesterolemia, unspecified: Secondary | ICD-10-CM

## 2013-07-02 DIAGNOSIS — E039 Hypothyroidism, unspecified: Secondary | ICD-10-CM

## 2013-07-03 LAB — LIPID PANEL
Cholesterol: 280 mg/dL — ABNORMAL HIGH (ref 0–200)
HDL: 55 mg/dL (ref >39)
Total CHOL/HDL Ratio: 5.1 Ratio

## 2013-07-04 ENCOUNTER — Telehealth: Payer: Self-pay | Admitting: *Deleted

## 2013-07-04 NOTE — Telephone Encounter (Signed)
Left message for patient to call office and schedule appointment to see Dr.Knapp to go over labs she had done this past Monday.

## 2013-07-19 ENCOUNTER — Ambulatory Visit (INDEPENDENT_AMBULATORY_CARE_PROVIDER_SITE_OTHER): Payer: Managed Care, Other (non HMO) | Admitting: Family Medicine

## 2013-07-19 ENCOUNTER — Encounter: Payer: Self-pay | Admitting: Family Medicine

## 2013-07-19 VITALS — BP 128/76 | HR 56 | Ht 66.0 in | Wt 141.0 lb

## 2013-07-19 DIAGNOSIS — E7800 Pure hypercholesterolemia, unspecified: Secondary | ICD-10-CM

## 2013-07-19 DIAGNOSIS — R220 Localized swelling, mass and lump, head: Secondary | ICD-10-CM

## 2013-07-19 DIAGNOSIS — R221 Localized swelling, mass and lump, neck: Secondary | ICD-10-CM

## 2013-07-19 DIAGNOSIS — Z23 Encounter for immunization: Secondary | ICD-10-CM

## 2013-07-19 DIAGNOSIS — E039 Hypothyroidism, unspecified: Secondary | ICD-10-CM

## 2013-07-19 NOTE — Patient Instructions (Addendum)
We are sending you to get an ultrasound of your neck, as I feel some asymmetry (and possibly a nodule) on the left side.  Please try and take your thyroid medication EVERY DAY.  Ideally, take it an hour before a meal (and other medications) or 4 hours after eating.  But, if the choice is to take is closer to food/meds or skip it entirely, take it whenever you can.    Your cholesterol is much improved since you have been taking the thyroid medication more regularly, but it is still quite high.  Try and follow a low cholesterol diet, and take the thyroid medication every day, and we will recheck in 6-8 weeks, to help determine whether or not we need you to restart the pravastatin (cholesterol medication).    Fat and Cholesterol Control Diet Cholesterol levels in your body are determined significantly by your diet. Cholesterol levels may also be related to heart disease. The following material helps to explain this relationship and discusses what you can do to help keep your heart healthy. Not all cholesterol is bad. Low-density lipoprotein (LDL) cholesterol is the "bad" cholesterol. It may cause fatty deposits to build up inside your arteries. High-density lipoprotein (HDL) cholesterol is "good." It helps to remove the "bad" LDL cholesterol from your blood. Cholesterol is a very important risk factor for heart disease. Other risk factors are high blood pressure, smoking, stress, heredity, and weight. The heart muscle gets its supply of blood through the coronary arteries. If your LDL cholesterol is high and your HDL cholesterol is low, you are at risk for having fatty deposits build up in your coronary arteries. This leaves less room through which blood can flow. Without sufficient blood and oxygen, the heart muscle cannot function properly and you may feel chest pains (angina pectoris). When a coronary artery closes up entirely, a part of the heart muscle may die causing a heart attack (myocardial  infarction). CHECKING CHOLESTEROL When your caregiver sends your blood to a lab to be examined for cholesterol, a complete lipid (fat) profile may be done. With this test, the total amount of cholesterol and levels of LDL and HDL are determined. Triglycerides are a type of fat that circulates in the blood. They can also be used to determine heart disease risk. The list below describes what the numbers should be: Test: Total Cholesterol.  Less than 200 mg/dl. Test: LDL "bad cholesterol."  Less than 100 mg/dl.  Less than 70 mg/dl if you are at very high risk of a heart attack or sudden cardiac death. Test: HDL "good cholesterol."  Greater than 50 mg/dl for women.  Greater than 40 mg/dl for men. Test: Triglycerides.  Less than 150 mg/dl. CONTROLLING CHOLESTEROL WITH DIET Although exercise and lifestyle factors are important, your diet is key. That is because certain foods are known to raise cholesterol and others to lower it. The goal is to balance foods for their effect on cholesterol and more importantly, to replace saturated and trans fat with other types of fat, such as monounsaturated fat, polyunsaturated fat, and omega-3 fatty acids. On average, a person should consume no more than 15 to 17 g of saturated fat daily. Saturated and trans fats are considered "bad" fats, and they will raise LDL cholesterol. Saturated fats are primarily found in animal products such as meats, butter, and cream. However, that does not mean you need to give up all your favorite foods. Today, there are good tasting, low-fat, low-cholesterol substitutes for most of the  things you like to eat. Choose low-fat or nonfat alternatives. Choose round or loin cuts of red meat. These types of cuts are lowest in fat and cholesterol. Chicken (without the skin), fish, veal, and ground [place] breast are great choices. Eliminate fatty meats, such as hot dogs and salami. Even shellfish have little or no saturated fat. Have a 3 oz  (85 g) portion when you eat lean meat, poultry, or fish. Trans fats are also called "partially hydrogenated oils." They are oils that have been scientifically manipulated so that they are solid at room temperature resulting in a longer shelf life and improved taste and texture of foods in which they are added. Trans fats are found in stick margarine, some tub margarines, cookies, crackers, and baked goods.  When baking and cooking, oils are a great substitute for butter. The monounsaturated oils are especially beneficial since it is believed they lower LDL and raise HDL. The oils you should avoid entirely are saturated tropical oils, such as coconut and palm.  Remember to eat a lot from food groups that are naturally free of saturated and trans fat, including fish, fruit, vegetables, beans, grains (barley, rice, couscous, bulgur wheat), and pasta (without cream sauces).  IDENTIFYING FOODS THAT LOWER CHOLESTEROL  Soluble fiber may lower your cholesterol. This type of fiber is found in fruits such as apples, vegetables such as broccoli, potatoes, and carrots, legumes such as beans, peas, and lentils, and grains such as barley. Foods fortified with plant sterols (phytosterol) may also lower cholesterol. You should eat at least 2 g per day of these foods for a cholesterol lowering effect.  Read package labels to identify low-saturated fats, trans fat free, and low-fat foods at the supermarket. Select cheeses that have only 2 to 3 g saturated fat per ounce. Use a heart-healthy tub margarine that is free of trans fats or partially hydrogenated oil. When buying baked goods (cookies, crackers), avoid partially hydrogenated oils. Breads and muffins should be made from whole grains (whole-wheat or whole oat flour, instead of "flour" or "enriched flour"). Buy non-creamy canned soups with reduced salt and no added fats.  FOOD PREPARATION TECHNIQUES  Never deep-fry. If you must fry, either stir-fry, which uses very  little fat, or use non-stick cooking sprays. When possible, broil, bake, or roast meats, and steam vegetables. Instead of putting butter or margarine on vegetables, use lemon and herbs, applesauce, and cinnamon (for squash and sweet potatoes), nonfat yogurt, salsa, and low-fat dressings for salads.  LOW-SATURATED FAT / LOW-FAT FOOD SUBSTITUTES Meats / Saturated Fat (g)  Avoid: Steak, marbled (3 oz/85 g) / 11 g  Choose: Steak, lean (3 oz/85 g) / 4 g  Avoid: Hamburger (3 oz/85 g) / 7 g  Choose: Hamburger, lean (3 oz/85 g) / 5 g  Avoid: Ham (3 oz/85 g) / 6 g  Choose: Ham, lean cut (3 oz/85 g) / 2.4 g  Avoid: Chicken, with skin, dark meat (3 oz/85 g) / 4 g  Choose: Chicken, skin removed, dark meat (3 oz/85 g) / 2 g  Avoid: Chicken, with skin, light meat (3 oz/85 g) / 2.5 g  Choose: Chicken, skin removed, light meat (3 oz/85 g) / 1 g Dairy / Saturated Fat (g)  Avoid: Whole milk (1 cup) / 5 g  Choose: Low-fat milk, 2% (1 cup) / 3 g  Choose: Low-fat milk, 1% (1 cup) / 1.5 g  Choose: Skim milk (1 cup) / 0.3 g  Avoid: Hard cheese (1 oz/28 g) /  6 g  Choose: Skim milk cheese (1 oz/28 g) / 2 to 3 g  Avoid: Cottage cheese, 4% fat (1 cup) / 6.5 g  Choose: Low-fat cottage cheese, 1% fat (1 cup) / 1.5 g  Avoid: Ice cream (1 cup) / 9 g  Choose: Sherbet (1 cup) / 2.5 g  Choose: Nonfat frozen yogurt (1 cup) / 0.3 g  Choose: Frozen fruit bar / trace  Avoid: Whipped cream (1 tbs) / 3.5 g  Choose: Nondairy whipped topping (1 tbs) / 1 g Condiments / Saturated Fat (g)  Avoid: Mayonnaise (1 tbs) / 2 g  Choose: Low-fat mayonnaise (1 tbs) / 1 g  Avoid: Butter (1 tbs) / 7 g  Choose: Extra light margarine (1 tbs) / 1 g  Avoid: Coconut oil (1 tbs) / 11.8 g  Choose: Olive oil (1 tbs) / 1.8 g  Choose: Corn oil (1 tbs) / 1.7 g  Choose: Safflower oil (1 tbs) / 1.2 g  Choose: Sunflower oil (1 tbs) / 1.4 g  Choose: Soybean oil (1 tbs) / 2.4 g  Choose: Canola oil (1 tbs) / 1  g Document Released: 11/01/2005 Document Revised: 01/24/2012 Document Reviewed: 04/22/2011 [employer] Patient Information 2014 [place], [place].

## 2013-07-19 NOTE — Progress Notes (Signed)
Chief Complaint  Patient presents with  . Follow-up    lab follow up.    Patient presents for follow up on her cholesterol and thyroid. She had labs done last month, and was asked to schedule appt to discuss.  She admits to missing some doses of her thyroid medication.  Sometimes she will fall asleep and forget to take it, realizes it the next morning.  This occurs about 2-3 times/week. She was thinking she needed to take meds 4 hours before eating (or 4 hrs after).  She works third shift.  She gained 4 pounds since last visit, but is now the same as in March.  Appetite is okay, same as usual for her (never was a "good eater). Denies skin/hair/bowel changes, moods are good.  Fibromyalgia seems to be a little better than previously.  She previously took lipid-lowering meds, not in a year.  She doesn't recall any side effect or intolerance, thinks she was told meds were no longer needed.  Per chart, it looks like she was on pravastatin.  Past Medical History  Diagnosis Date  . Thyroid cancer   . COPD (chronic obstructive pulmonary disease)   . Depression   . GERD (gastroesophageal reflux disease)   . Sinus bradycardia   . Hypothyroidism   . Hematuria     microscopic hematuria (urol & nephrol w/u neg), multiple UTI`s  . Fibromyalgia   . Arthritis     Rheumatoid and OA.  sees Dr. Dareen Piano [facility], rheumatology)  . Carpal tunnel syndrome     right wrist  . Hyperlipidemia     managed by Dr. Dareen Piano  . Microscopic hematuria     glomerular from sickle cell trait (per Dr. Annabell Howells 02/2013)   Past Surgical History  Procedure Laterality Date  . Total abdominal hysterectomy    . Thyroid surgery  2007  . Cystoscopy  02/26/2013    Dr. Annabell Howells (normal; follicular cystitis)   History   Social History  . Marital Status: Divorced    Spouse Name: N/A    Number of Children: 2  . Years of Education: N/A   Occupational History  . mail processing clerk [employer]   Social History Main  Topics  . Smoking status: Current Every Day Smoker -- 0.50 packs/day for 8 years    Types: Cigarettes    Last Attempt to Quit: 01/24/2013  . Smokeless tobacco: Not on file     Comment: starting smoking again after son died  . Alcohol Use: Yes     Comment: glass of wine every now and then.  . Drug Use: No  . Sexual Activity: Yes    Partners: Male   Other Topics Concern  . Not on file   Social History Narrative   Lives alone.  2 living children (1 deceased), 1 in [place], 1 in [place], 6 grandchildren   Current outpatient prescriptions:Ascorbic Acid (VITAMIN C) 500 MG tablet, Take 500 mg by mouth daily. , Disp: , Rfl: ;  cholecalciferol (VITAMIN D) 1000 UNITS tablet, Take 1,000 Units by mouth daily., Disp: , Rfl: ;  diclofenac sodium (VOLTAREN) 1 % GEL, Apply 1 application topically 4 (four) times daily., Disp: , Rfl: ;  Multiple Vitamin (MULTIVITAMIN) tablet, Take 1 tablet by mouth daily., Disp: , Rfl:  Multiple Vitamins-Minerals (HAIR/SKIN/NAILS PO), Take 2 capsules by mouth daily., Disp: , Rfl: ;  naproxen (NAPROSYN) 500 MG tablet, Take 500 mg by mouth 2 (two) times daily with a meal., Disp: , Rfl: ;  pregabalin (LYRICA) 75 MG capsule, Take 150 mg by mouth 2 (two) times daily. , Disp: , Rfl: ;  SYNTHROID 150 MCG tablet, TAKE 1 TABLET BY MOUTH DAILY BEFORE BREAKFAST, Disp: 30 tablet, Rfl: 0 traMADol (ULTRAM) 50 MG tablet, Take 50 mg by mouth every 6 (six) hours as needed for pain., Disp: , Rfl:   No Known Allergies  ROS:  Denies fevers, chills, URI symptoms, chest pain, shortness of breath, cough, nausea, vomiting, diarrhea, bowel changes, urinary complaints, skin changes, hair loss, depression.  +fibromyalgia/chronic pain, somewhat improved.  PHYSICAL EXAM: BP 128/76  Pulse 56  Ht 5\' 6"  (1.676 m)  Wt 141 lb (63.957 kg)  BMI 22.77 kg/m2 Pleasant female, in no distress. HEENT:  PERRL, conjunctiva clear. OP clear Neck: no lymphadenopathy.  ? Nodule palpable on L neck, which moves  with swallow.  Nontender.  ?deformity/asymmetry to cartilage, vs recurrent thyroid mass/nodule (s/p thyroidectomy) or scar tissue. No lymphadenopathy or other masses noted Heart: regular rate and rhythm without murmur Lungs: clear bilaterally Extremities: no edema Skin: no rash Psych: normal mood, affect, hygiene and grooming Neuro; alert and oriented.  Normal gait.   Lab Results  Component Value Date   CHOL 280* 07/02/2013   HDL 55 07/02/2013   LDLCALC 209* 07/02/2013   TRIG 82 07/02/2013   CHOLHDL 5.1 07/02/2013   Lab Results  Component Value Date   TSH 28.038* 07/02/2013   ASSESSMENT/PLAN:  HYPOTHYROIDISM - inadequately replaced due to noncompliance with meds - Plan: TSH, US Soft Tissue Head/Neck  Need for prophylactic vaccination and inoculation against influenza - Plan: Flu Vaccine QUAD 36+ mos IM  Neck nodule - check u/s--?scar tissue, vs related to cartilage vs r/o recurrent nodule/mass (h/o thyroid cancer) - Plan: US Soft Tissue Head/Neck  Pure hypercholesterolemia - significantly improved since back on thyroid med; still quite high, but thyroid still isn't adequately replaced. recheck with next TSH; low chol diet reviewed - Plan: Lipid panel   H/o thyroid cancer, s/p thyroidectomy.  Last I131 scan was in 2010 and was normal.  I am now feeling a little nodule on the left--this might just be cartilage, but prefer to check ultrasound.  Please try and take your thyroid medication EVERY DAY.  Ideally, take it an hour before a meal (and other medications) or 4 hours after eating.  But, if the choice is to take is closer to food/meds or skip it entirely, take it whenever you can.    Your cholesterol is much improved since you have been taking the thyroid medication more regularly, but it is still quite high.  Try and follow a low cholesterol diet, and take the thyroid medication every day, and we will recheck in 6-8 weeks, to help determine whether or not we need you to restart the  pravastatin (cholesterol medication).    25 min visit, more than 1/2 spent counseling (proper way to take thyroid med, how to incorporate into her routine; recommended use of alarm on her phone to help remind; low cholesterol diet; risks of noncompliance with meds)

## 2013-08-01 ENCOUNTER — Ambulatory Visit
Admission: RE | Admit: 2013-08-01 | Discharge: 2013-08-01 | Disposition: A | Discharge status: Home / Self Care | Payer: 59 | Source: Ambulatory Visit | Attending: Family Medicine | Admitting: Family Medicine

## 2013-08-01 DIAGNOSIS — E039 Hypothyroidism, unspecified: Secondary | ICD-10-CM

## 2013-08-01 DIAGNOSIS — R221 Localized swelling, mass and lump, neck: Secondary | ICD-10-CM

## 2013-09-03 ENCOUNTER — Other Ambulatory Visit: Payer: Managed Care, Other (non HMO)

## 2013-09-04 ENCOUNTER — Telehealth: Payer: Self-pay | Admitting: Family Medicine

## 2013-09-04 NOTE — Telephone Encounter (Signed)
Pt missed fasting labs needs to be rescheduled.

## 2013-12-04 ENCOUNTER — Ambulatory Visit (INDEPENDENT_AMBULATORY_CARE_PROVIDER_SITE_OTHER): Payer: 59

## 2013-12-04 ENCOUNTER — Telehealth: Payer: Self-pay | Admitting: *Deleted

## 2013-12-04 ENCOUNTER — Ambulatory Visit (INDEPENDENT_AMBULATORY_CARE_PROVIDER_SITE_OTHER): Payer: Managed Care, Other (non HMO)

## 2013-12-04 VITALS — BP 129/84 | HR 67 | Resp 12

## 2013-12-04 DIAGNOSIS — R52 Pain, unspecified: Secondary | ICD-10-CM

## 2013-12-04 DIAGNOSIS — IMO0001 Reserved for inherently not codable concepts without codable children: Secondary | ICD-10-CM

## 2013-12-04 DIAGNOSIS — S93609A Unspecified sprain of unspecified foot, initial encounter: Secondary | ICD-10-CM

## 2013-12-04 DIAGNOSIS — M797 Fibromyalgia: Secondary | ICD-10-CM

## 2013-12-04 DIAGNOSIS — G629 Polyneuropathy, unspecified: Secondary | ICD-10-CM

## 2013-12-04 DIAGNOSIS — G609 Hereditary and idiopathic neuropathy, unspecified: Secondary | ICD-10-CM

## 2013-12-04 DIAGNOSIS — M2040 Other hammer toe(s) (acquired), unspecified foot: Secondary | ICD-10-CM

## 2013-12-04 MED ORDER — PREGABALIN 150 MG PO CAPS: 150.0000 mg | ORAL_CAPSULE | Freq: Two times a day (BID) | ORAL | Status: DC

## 2013-12-04 NOTE — Telephone Encounter (Signed)
Pt left rx for Lyrica 150mg  in the office.  I called the rx to [facility] on the [address] of [address] and informed pt.

## 2013-12-04 NOTE — Patient Instructions (Signed)
Fibromyalgia Fibromyalgia is a disorder that is often misunderstood. It is associated with muscular pains and tenderness that comes and goes. It is often associated with fatigue and sleep disturbances. Though it tends to be long-lasting, fibromyalgia is not life-threatening. CAUSES  The exact cause of fibromyalgia is unknown. People with certain gene types are predisposed to developing fibromyalgia and other conditions. Certain factors can play a role as triggers, such as:  Spine disorders.  Arthritis.  Severe injury (trauma) and other physical stressors.  Emotional stressors. SYMPTOMS   The main symptom is pain and stiffness in the muscles and joints, which can vary over time.  Sleep and fatigue problems. Other related symptoms may include:  Bowel and bladder problems.  Headaches.  Visual problems.  Problems with odors and noises.  Depression or mood changes.  Painful periods (dysmenorrhea).  Dryness of the skin or eyes. DIAGNOSIS  There are no specific tests for diagnosing fibromyalgia. Patients can be diagnosed accurately from the specific symptoms they have. The diagnosis is made by determining that nothing else is causing the problems. TREATMENT  There is no cure. Management includes medicines and an active, healthy lifestyle. The goal is to enhance physical fitness, decrease pain, and improve sleep. HOME CARE INSTRUCTIONS   Only take over-the-counter or prescription medicines as directed by your caregiver. Sleeping pills, tranquilizers, and pain medicines may make your problems worse.  Low-impact aerobic exercise is very important and advised for treatment. At first, it may seem to make pain worse. Gradually increasing your tolerance will overcome this feeling.  Learning relaxation techniques and how to control stress will help you. Biofeedback, visual imagery, hypnosis, muscle relaxation, yoga, and meditation are all options.  Anti-inflammatory medicines and  physical therapy may provide short-term help.  Acupuncture or massage treatments may help.  Take muscle relaxant medicines as suggested by your caregiver.  Avoid stressful situations.  Plan a healthy lifestyle. This includes your diet, sleep, rest, exercise, and friends.  Find and practice a hobby you enjoy.  Join a fibromyalgia support group for interaction, ideas, and sharing advice. This may be helpful. SEEK MEDICAL CARE IF:  You are not having good results or improvement from your treatment. FOR MORE INFORMATION  National Fibromyalgia Association: www.fmaware.[place]: www.arthritis.org Document Released: 11/01/2005 Document Revised: 01/24/2012 Document Reviewed: 02/11/2010 [facility] Patient Information 2014 [place], [place]. Peripheral Neuropathy Peripheral neuropathy is a type of nerve damage. It affects nerves that carry signals between the spinal cord and other parts of the body. These are called peripheral nerves. With peripheral neuropathy, one nerve or a group of nerves may be damaged.  CAUSES  Many things can damage peripheral nerves. For some people with peripheral neuropathy, the cause is unknown. Some causes include:  Diabetes. This is the most common cause of peripheral neuropathy.  Injury to a nerve.  Pressure or stress on a nerve that lasts a long time.  Too little vitamin B. Alcoholism can lead to this.  Infections.  Autoimmune diseases, such as multiple sclerosis and systemic lupus erythematosus.  Inherited nerve diseases.  Some medicines, such as cancer drugs.  Toxic substances, such as lead and mercury.  Too little blood flowing to the legs.  Kidney disease.  Thyroid disease. SIGNS AND SYMPTOMS  Different people have different symptoms. The symptoms you have will depend on which of your nerves is damaged. Common symptoms include:  Loss of feeling (numbness) in the feet and hands.  Tingling in the feet and hands.  Pain that  burns.  Very  sensitive skin.  Weakness.  Not being able to move a part of the body (paralysis).  Muscle twitching.  Clumsiness or poor coordination.  Loss of balance.  Not being able to control your bladder.  Feeling dizzy.  Sexual problems. DIAGNOSIS  Peripheral neuropathy is a symptom, not a disease. Finding the cause of peripheral neuropathy can be hard. To figure that out, your health care provider will take a medical history and do a physical exam. A neurological exam will also be done. This involves checking things affected by your brain, spinal cord, and nerves (nervous system). For example, your health care provider will check your reflexes, how you move, and what you can feel.  Other types of tests may also be ordered, such as:  Blood tests.  A test of the fluid in your spinal cord.  Imaging tests, such as CT scans or an MRI.  Electromyography (EMG). This test checks the nerves that control muscles.  Nerve conduction velocity tests. These tests check how fast messages pass through your nerves.  Nerve biopsy. A small piece of nerve is removed. It is then checked under a microscope. TREATMENT   Medicine is often used to treat peripheral neuropathy. Medicines may include:  Pain-relieving medicines. Prescription or over-the-counter medicine may be suggested.  Antiseizure medicine. This may be used for pain.  Antidepressants. These also may help ease pain from neuropathy.  Lidocaine. This is a numbing medicine. You might wear a patch or be given a shot.  Mexiletine. This medicine is typically used to help control irregular heart rhythms.  Surgery. Surgery may be needed to relieve pressure on a nerve or to destroy a nerve that is causing pain.  Physical therapy to help movement.  Assistive devices to help movement. HOME CARE INSTRUCTIONS   Only take over-the-counter or prescription medicines as directed by your health care provider. Follow the instructions  carefully for any given medicines. Do not take any other medicines without first getting approval from your health care provider.  If you have diabetes, work closely with your health care provider to keep your blood sugar under control.  If you have numbness in your feet:  Check every day for signs of injury or infection. Watch for redness, warmth, and swelling.  Wear padded socks and comfortable shoes. These help protect your feet.  Do not do things that put pressure on your damaged nerve.  Do not smoke. Smoking keeps blood from getting to damaged nerves.  Avoid or limit alcohol. Too much alcohol can cause a lack of B vitamins. These vitamins are needed for healthy nerves.  Develop a good support system. Coping with peripheral neuropathy can be stressful. Talk to a mental health specialist or join a support group if you are struggling.  Follow up with your health care provider as directed. SEEK MEDICAL CARE IF:   You have new signs or symptoms of peripheral neuropathy.  You are struggling emotionally from dealing with peripheral neuropathy.  You have a fever. SEEK IMMEDIATE MEDICAL CARE IF:   You have an injury or infection that is not healing.  You feel very dizzy or begin vomiting.  You have chest pain.  You have trouble breathing. Document Released: 10/22/2002 Document Revised: 07/14/2011 Document Reviewed: 07/09/2013 [facility] Patient Information 2014 [place].

## 2013-12-04 NOTE — Progress Notes (Signed)
   Subjective:    Patient ID: Emma Wilson, female    DOB: 03/21/1956, 58 y.o.   MRN: [id number]  HPI Comments: '' RT FOOT TOES ARE CURVING IN AND PAINFUL WALKING.''  Toe Pain  The incident occurred more than 1 week ago. The incident occurred at home. The injury mechanism is unknown. The pain is present in the right toes. The quality of the pain is described as stabbing, burning and aching. The pain is at a severity of 3/10. The pain is moderate. The pain has been constant since onset. She has tried heat for the symptoms. The treatment provided no relief.      Review of Systems  All other systems reviewed and are negative.       Objective:   Physical Exam Vascular status is intact with pedal pulses palpable DP and PT +2/4 Refill time 3 seconds all digits. Skin temperature warm turgor normal there is no edema rubor pallor mild varicosities noted. Neurologically epicritic and proprioceptive sensations intact and there is some paresthesia pain on any palpation percussion the hallux and lesser digits 2 through 5 as well right foot more severe than left for both feet of similar symptoms with paresthesia. Patient does have a history of some back problems and issues as well as is long-standing history of fibromyalgia. Previously treated with Lyrica had to discontinue due to cost is recently switched amitriptyline which is not helping as well as Lyrica had the past. Orthopedic biomechanical exam rectus foot type flexible hammertoe contractures 2 through 5 right with medial deviation however they are not rigid no crepitus no significant arthrosis noted radiographically. 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 At this time suggested resuming Lyrica a rather than amitriptyline new prescription for Lyrica 150  twice a day is dispensed with a coupon for discount suggest a trial for at least 3 months reevaluate at that time she continues to have symptoms. Patient is being managed for her fibromyalgia by Dr. Ouida Sills rheumatologist and will continue following him. If she continues to have more significant neurologic symptoms may want your referral for neuro consult. We'll discuss this further followup visit  Harriet Masson DPM

## 2013-12-05 ENCOUNTER — Observation Stay (HOSPITAL_COMMUNITY)
Admission: EM | Admit: 2013-12-05 | Discharge: 2013-12-06 | Disposition: A | Discharge status: Home / Self Care | Payer: 59 | Attending: Internal Medicine | Admitting: Internal Medicine

## 2013-12-05 ENCOUNTER — Encounter (HOSPITAL_COMMUNITY): Payer: Self-pay | Admitting: Emergency Medicine

## 2013-12-05 ENCOUNTER — Emergency Department (HOSPITAL_COMMUNITY): Payer: 59

## 2013-12-05 DIAGNOSIS — R001 Bradycardia, unspecified: Secondary | ICD-10-CM | POA: Diagnosis present

## 2013-12-05 DIAGNOSIS — Z8701 Personal history of pneumonia (recurrent): Secondary | ICD-10-CM | POA: Diagnosis not present

## 2013-12-05 DIAGNOSIS — K219 Gastro-esophageal reflux disease without esophagitis: Secondary | ICD-10-CM | POA: Insufficient documentation

## 2013-12-05 DIAGNOSIS — I498 Other specified cardiac arrhythmias: Secondary | ICD-10-CM

## 2013-12-05 DIAGNOSIS — Z85850 Personal history of malignant neoplasm of thyroid: Secondary | ICD-10-CM | POA: Insufficient documentation

## 2013-12-05 DIAGNOSIS — E039 Hypothyroidism, unspecified: Secondary | ICD-10-CM | POA: Diagnosis present

## 2013-12-05 DIAGNOSIS — F3289 Other specified depressive episodes: Secondary | ICD-10-CM | POA: Insufficient documentation

## 2013-12-05 DIAGNOSIS — M069 Rheumatoid arthritis, unspecified: Secondary | ICD-10-CM | POA: Diagnosis not present

## 2013-12-05 DIAGNOSIS — M797 Fibromyalgia: Secondary | ICD-10-CM | POA: Diagnosis present

## 2013-12-05 DIAGNOSIS — J441 Chronic obstructive pulmonary disease with (acute) exacerbation: Secondary | ICD-10-CM | POA: Insufficient documentation

## 2013-12-05 DIAGNOSIS — R0789 Other chest pain: Secondary | ICD-10-CM | POA: Diagnosis not present

## 2013-12-05 DIAGNOSIS — M1990 Unspecified osteoarthritis, unspecified site: Secondary | ICD-10-CM | POA: Diagnosis not present

## 2013-12-05 DIAGNOSIS — Z8669 Personal history of other diseases of the nervous system and sense organs: Secondary | ICD-10-CM | POA: Insufficient documentation

## 2013-12-05 DIAGNOSIS — Z79899 Other long term (current) drug therapy: Secondary | ICD-10-CM | POA: Insufficient documentation

## 2013-12-05 DIAGNOSIS — F17200 Nicotine dependence, unspecified, uncomplicated: Secondary | ICD-10-CM | POA: Insufficient documentation

## 2013-12-05 DIAGNOSIS — R079 Chest pain, unspecified: Secondary | ICD-10-CM | POA: Diagnosis present

## 2013-12-05 DIAGNOSIS — R1013 Epigastric pain: Secondary | ICD-10-CM | POA: Insufficient documentation

## 2013-12-05 DIAGNOSIS — Z791 Long term (current) use of non-steroidal anti-inflammatories (NSAID): Secondary | ICD-10-CM | POA: Diagnosis not present

## 2013-12-05 DIAGNOSIS — G8929 Other chronic pain: Secondary | ICD-10-CM | POA: Diagnosis not present

## 2013-12-05 DIAGNOSIS — F329 Major depressive disorder, single episode, unspecified: Secondary | ICD-10-CM | POA: Diagnosis not present

## 2013-12-05 DIAGNOSIS — Z87448 Personal history of other diseases of urinary system: Secondary | ICD-10-CM | POA: Insufficient documentation

## 2013-12-05 DIAGNOSIS — E785 Hyperlipidemia, unspecified: Secondary | ICD-10-CM | POA: Insufficient documentation

## 2013-12-05 DIAGNOSIS — J449 Chronic obstructive pulmonary disease, unspecified: Secondary | ICD-10-CM | POA: Diagnosis present

## 2013-12-05 HISTORY — DX: Other chronic pain: G89.29

## 2013-12-05 HISTORY — DX: Unspecified osteoarthritis, unspecified site: M19.90

## 2013-12-05 HISTORY — DX: Pneumonia, unspecified organism: J18.9

## 2013-12-05 HISTORY — DX: Dorsalgia, unspecified: M54.9

## 2013-12-05 HISTORY — DX: Migraine, unspecified, not intractable, without status migrainosus: G43.909

## 2013-12-05 HISTORY — DX: Rheumatoid arthritis, unspecified: M06.9

## 2013-12-05 HISTORY — DX: Shortness of breath: R06.02

## 2013-12-05 LAB — HEPATIC FUNCTION PANEL
ALT: 9 U/L (ref 0–35)
AST: 19 U/L (ref 0–37)
Albumin: 4.3 g/dL (ref 3.5–5.2)
Alkaline Phosphatase: 93 U/L (ref 39–117)
Bilirubin, Direct: <0.2 mg/dL (ref 0.0–0.3)
Total Bilirubin: 0.5 mg/dL (ref 0.3–1.2)
Total Protein: 8 g/dL (ref 6.0–8.3)

## 2013-12-05 LAB — CBC
HCT: 37.7 % (ref 36.0–46.0)
Hemoglobin: 13.4 g/dL (ref 12.0–15.0)
MCH: 28.1 pg (ref 26.0–34.0)
MCHC: 35.5 g/dL (ref 30.0–36.0)
MCV: 79 fL (ref 78.0–100.0)
PLATELETS: 284 10*3/uL (ref 150–400)
RBC: 4.77 MIL/uL (ref 3.87–5.11)
RDW: 14.5 % (ref 11.5–15.5)
WBC: 5.5 10*3/uL (ref 4.0–10.5)

## 2013-12-05 LAB — POCT I-STAT TROPONIN I
TROPONIN I, POC: 0 ng/mL (ref 0.00–0.08)
TROPONIN I, POC: 0.01 ng/mL (ref 0.00–0.08)
TROPONIN I, POC: 0 ng/mL (ref 0.00–0.08)

## 2013-12-05 LAB — BASIC METABOLIC PANEL
BUN: 9 mg/dL (ref 6–23)
CALCIUM: 9.3 mg/dL (ref 8.4–10.5)
CO2: 26 meq/L (ref 19–32)
Chloride: 101 mEq/L (ref 96–112)
Creatinine, Ser: 0.73 mg/dL (ref 0.50–1.10)
GFR calc Af Amer: >90 mL/min (ref >90)
Glucose, Bld: 87 mg/dL (ref 70–99)
POTASSIUM: 4.1 meq/L (ref 3.7–5.3)
SODIUM: 141 meq/L (ref 137–147)

## 2013-12-05 LAB — TROPONIN I

## 2013-12-05 LAB — PRO B NATRIURETIC PEPTIDE: Pro B Natriuretic peptide (BNP): 66.5 pg/mL (ref 0–125)

## 2013-12-05 LAB — LIPASE, BLOOD: Lipase: 23 U/L (ref 11–59)

## 2013-12-05 MED ORDER — ACETAMINOPHEN 325 MG PO TABS: 650.0000 mg | ORAL_TABLET | ORAL | Status: DC | PRN

## 2013-12-05 MED ORDER — VITAMIN D3 25 MCG (1000 UNIT) PO TABS
1000.0000 [IU] | ORAL_TABLET | Freq: Every day | ORAL | Status: DC
  Administered 2013-12-05 – 2013-12-06 (×2): 1000 [IU] via ORAL
  Filled 2013-12-05 (×2): qty 1

## 2013-12-05 MED ORDER — ONDANSETRON HCL 4 MG/2ML IJ SOLN: 4.0000 mg | Freq: Four times a day (QID) | INTRAMUSCULAR | Status: DC | PRN

## 2013-12-05 MED ORDER — TRAMADOL HCL 50 MG PO TABS
50.0000 mg | ORAL_TABLET | Freq: Four times a day (QID) | ORAL | Status: DC | PRN
  Administered 2013-12-05 – 2013-12-06 (×3): 50 mg via ORAL
  Filled 2013-12-05 (×3): qty 1

## 2013-12-05 MED ORDER — ASPIRIN 81 MG PO CHEW
324.0000 mg | CHEWABLE_TABLET | Freq: Once | ORAL | Status: AC
  Administered 2013-12-05: 324 mg via ORAL
  Filled 2013-12-05: qty 4

## 2013-12-05 MED ORDER — LEVOTHYROXINE SODIUM 150 MCG PO TABS
150.0000 ug | ORAL_TABLET | Freq: Every day | ORAL | Status: DC
  Administered 2013-12-06: 150 ug via ORAL
  Filled 2013-12-05 (×3): qty 1

## 2013-12-05 MED ORDER — NITROGLYCERIN 0.4 MG SL SUBL
0.4000 mg | SUBLINGUAL_TABLET | SUBLINGUAL | Status: DC | PRN
  Administered 2013-12-05 (×2): 0.4 mg via SUBLINGUAL
  Filled 2013-12-05: qty 25

## 2013-12-05 MED ORDER — NITROGLYCERIN 0.4 MG SL SUBL: 0.4000 mg | SUBLINGUAL_TABLET | SUBLINGUAL | Status: DC | PRN

## 2013-12-05 MED ORDER — VITAMIN C 500 MG PO TABS
500.0000 mg | ORAL_TABLET | Freq: Every day | ORAL | Status: DC
  Administered 2013-12-05 – 2013-12-06 (×2): 500 mg via ORAL
  Filled 2013-12-05 (×2): qty 1

## 2013-12-05 MED ORDER — NAPROXEN 500 MG PO TABS
500.0000 mg | ORAL_TABLET | Freq: Two times a day (BID) | ORAL | Status: DC
  Administered 2013-12-05 – 2013-12-06 (×2): 500 mg via ORAL
  Filled 2013-12-05 (×3): qty 1

## 2013-12-05 MED ORDER — MORPHINE SULFATE 2 MG/ML IJ SOLN: 2.0000 mg | INTRAMUSCULAR | Status: DC | PRN

## 2013-12-05 MED ORDER — GI COCKTAIL ~~LOC~~: 30.0000 mL | Freq: Four times a day (QID) | ORAL | Status: DC | PRN

## 2013-12-05 MED ORDER — ENOXAPARIN SODIUM 40 MG/0.4ML ~~LOC~~ SOLN
40.0000 mg | SUBCUTANEOUS | Status: DC
Start: 2013-12-05 — End: 2013-12-06
  Administered 2013-12-05: 40 mg via SUBCUTANEOUS
  Filled 2013-12-05 (×2): qty 0.4

## 2013-12-05 MED ORDER — TRAMADOL HCL 50 MG PO TABS
50.0000 mg | ORAL_TABLET | Freq: Once | ORAL | Status: AC
  Administered 2013-12-05: 50 mg via ORAL
  Filled 2013-12-05: qty 1

## 2013-12-05 MED ORDER — PREGABALIN 75 MG PO CAPS
150.0000 mg | ORAL_CAPSULE | Freq: Two times a day (BID) | ORAL | Status: DC
  Administered 2013-12-05 – 2013-12-06 (×2): 150 mg via ORAL
  Filled 2013-12-05 (×2): qty 2

## 2013-12-05 MED ORDER — DICLOFENAC SODIUM 1 % TD GEL
1.0000 "application " | Freq: Four times a day (QID) | TRANSDERMAL | Status: DC
  Filled 2013-12-05: qty 100

## 2013-12-05 NOTE — ED Provider Notes (Signed)
CSN: [id number]     Arrival date & time 12/05/13  1206 History   First MD Initiated Contact with Patient 12/05/13 1544     Chief Complaint  Patient presents with  . Chest Pain  . Shortness of Breath   (Consider location/radiation/quality/duration/timing/severity/associated sxs/prior Treatment) Patient is a 58 y.o. female presenting with chest pain and shortness of breath.  Chest Pain Associated symptoms: shortness of breath   Shortness of Breath Associated symptoms: chest pain    Pt with history of multiple medical problems, but no known CAD reports onset of midsternal/epigastric pain earlier today, associated with SOB. Continued during the morning and so she came to the ED for evaluation. While waiting to be seen her fibromyalgia flared up and so that pain is worse now than her chest pain which she rates as a 4/10. Denies nausea or diaphoresis. No particular provoking or relieving factors.  Past Medical History  Diagnosis Date  . Thyroid cancer   . COPD (chronic obstructive pulmonary disease)   . Depression   . GERD (gastroesophageal reflux disease)   . Sinus bradycardia   . Hypothyroidism   . Hematuria     microscopic hematuria (urol & nephrol w/u neg), multiple UTI`s  . Fibromyalgia   . Arthritis     Rheumatoid and OA.  sees Dr. Anderson ([facility], rheumatology)  . Carpal tunnel syndrome     right wrist  . Hyperlipidemia     managed by Dr. Anderson  . Microscopic hematuria     glomerular from sickle cell trait (per Dr. Wrenn 02/2013)   Past Surgical History  Procedure Laterality Date  . Total abdominal hysterectomy    . Thyroid surgery  2007  . Cystoscopy  02/26/2013    Dr. Wrenn (normal; follicular cystitis)   Family History  Problem Relation Age of Onset  . Stroke Mother   . Coronary artery disease Mother   . Kidney disease Mother     on dialysis  . Cancer Father     bone cancer  . Arthritis Sister     rheumatoid and osteoarthritis  . Arthritis Sister    RA and OA   History  Substance Use Topics  . Smoking status: Current Every Day Smoker -- 0.50 packs/day for 8 years    Types: Cigarettes    Last Attempt to Quit: 01/24/2013  . Smokeless tobacco: Not on file     Comment: starting smoking again after son died  . Alcohol Use: Yes     Comment: glass of wine every now and then.   OB History   Grav Para Term Preterm Abortions TAB SAB Ect Mult Living                 Review of Systems  Respiratory: Positive for shortness of breath.   Cardiovascular: Positive for chest pain.   All other systems reviewed and are negative except as noted in HPI.   Allergies  Review of patient's allergies indicates no known allergies.  Home Medications   Current Outpatient Rx  Name  Route  Sig  Dispense  Refill  . Ascorbic Acid (VITAMIN C) 500 MG tablet   Oral   Take 500 mg by mouth daily.          . cholecalciferol (VITAMIN D) 1000 UNITS tablet   Oral   Take 1,000 Units by mouth daily.         . diclofenac sodium (VOLTAREN) 1 % GEL   Topical   Apply 1 application   topically 4 (four) times daily.         Marland Kitchen levothyroxine (SYNTHROID, LEVOTHROID) 150 MCG tablet   Oral   Take 150 mcg by mouth daily before breakfast.         . Multiple Vitamin (MULTIVITAMIN) tablet   Oral   Take 1 tablet by mouth daily.         . naproxen (NAPROSYN) 500 MG tablet   Oral   Take 500 mg by mouth every 12 (twelve) hours.         . traMADol (ULTRAM) 50 MG tablet   Oral   Take 50 mg by mouth every 6 (six) hours as needed for pain.         . pregabalin (LYRICA) 150 MG capsule   Oral   Take 1 capsule (150 mg total) by mouth 2 (two) times daily.   60 capsule   5    BP 141/80  Pulse 49  Temp(Src) 97.8 F (36.6 C) (Oral)  Resp 11  Ht 5' 7" (1.702 m)  Wt 138 lb (62.596 kg)  BMI 21.61 kg/m2  SpO2 100% Physical Exam  Nursing note and vitals reviewed. Constitutional: She is oriented to person, place, and time. She appears well-developed and  well-nourished.  HENT:  Head: Normocephalic and atraumatic.  Eyes: EOM are normal. Pupils are equal, round, and reactive to light.  Neck: Normal range of motion. Neck supple.  Cardiovascular: Normal rate, normal heart sounds and intact distal pulses.   Pulmonary/Chest: Effort normal and breath sounds normal. She has no wheezes. She has no rales. She exhibits no tenderness.  Abdominal: Bowel sounds are normal. She exhibits no distension. There is tenderness (epigastric mild). There is no rebound and no guarding.  Musculoskeletal: Normal range of motion. She exhibits no edema and no tenderness.  Neurological: She is alert and oriented to person, place, and time. She has normal strength. No cranial nerve deficit or sensory deficit.  Skin: Skin is warm and dry. No rash noted.  Psychiatric: She has a normal mood and affect.    ED Course  Procedures (including critical care time) Labs Review Labs Reviewed  CBC  BASIC METABOLIC PANEL  HEPATIC FUNCTION PANEL  LIPASE, BLOOD  TROPONIN I  TROPONIN I  TROPONIN I  LIPID PANEL  TSH  PRO B NATRIURETIC PEPTIDE  POCT I-STAT TROPONIN I  POCT I-STAT TROPONIN I   Imaging Review Dg Chest 2 View  12/05/2013   CLINICAL DATA:  Chest pain, fever, shortness of Breath  EXAM: CHEST  2 VIEW  COMPARISON:  05/30/2012  FINDINGS: Cardiomediastinal silhouette is stable. No acute infiltrate or pleural effusion. No pulmonary edema. Mild degenerative changes thoracic spine. Again noted chronic mild interstitial prominence.  IMPRESSION: No active cardiopulmonary disease.   Electronically Signed   By: Lahoma Crocker M.D.   On: 12/05/2013 12:40   Dg Foot Complete Right  12/04/2013   X-ray report 3 views right foot AP lateral and lateral oblique views are examined.  Soft tissue structures otherwise unremarkable mild osteopenic changes  noted on osseous evaluation sesamoids rectus hallux is rectus adductovarus  rotation lesser digits 34 and 5 noted on AP view. Oblique view   unremarkable there may be some mild arthrosis Lisfranc for fifth met base  and cuneiform articulation site however no fractures noted no cysts or  tumors noted lateral projection reveals some thickening of fascial  structures mild retrocalcaneal spur no inferior calcaneal spurring noted  cyma line intact subtalar joint intact posterior middle  facets identified.  Synovitis of the first metatarsal noted on lateral projection.  Impression rectus foot type mild plantar fascial thickening mild arthrosis  Lisfranc joint fourth fifth met cuneiform base otherwise unremarkable foot  structures noted  Harriet Masson DPM   EKG Interpretation    Date/Time:  Wednesday December 05 2013 17:45:24 EST Ventricular Rate:  50 PR Interval:  172 QRS Duration: 67 QT Interval:  460 QTC Calculation: 419 R Axis:   61 Text Interpretation:  Sinus rhythm Borderline ST elevation, inferior leads Since last tracing arm lead reversal resolved Confirmed by SHELDON  MD, CHARLES ([id number]) on 12/05/2013 5:52:37 PM            MDM   1. Chest pain   2. Bradycardia     Labs and imaging reviewed and unremarkable. Pt with risk factors but atypical story. Pain free after NTG. Will admit for rule out.     Charles B. Karle Starch, MD 12/05/13 1816

## 2013-12-05 NOTE — H&P (Signed)
[facility] History and Physical  Emma Wilson KLK:[id number] DOB: [birth year] DOA: 12/05/2013  Referring physician: ED Physician.  PCP: Vikki Ports, MD   Chief Complaint: Chest pain.   HPI: Emma Wilson is a 58 y.o. female with PMH significant for Hypothyroidism, RA, Sinus Bradycardia , she had stress test 2010 which was normal. She presents complaining of chest pain , pressure like, that started the morning of admission. Pain has resolved. She rate pain at that time 5/10. Chest pain is bilaterally, bellow her breast. She also relates SOB when she was lying flat this AM. She does relates dyspnea on exertion. She denies nausea, vomiting.    Review of Systems:  Negative except as per HPI.    Past Medical History  Diagnosis Date  . Thyroid cancer   . COPD (chronic obstructive pulmonary disease)   . Depression   . GERD (gastroesophageal reflux disease)   . Sinus bradycardia   . Hypothyroidism   . Hematuria     microscopic hematuria (urol & nephrol w/u neg), multiple UTI`s  . Fibromyalgia   . Arthritis     Rheumatoid and OA.  sees Dr. Ouida Sills [facility] medical, rheumatology)  . Carpal tunnel syndrome     right wrist  . Hyperlipidemia     managed by Dr. Ouida Sills  . Microscopic hematuria     glomerular from sickle cell trait (per Dr. Jeffie Pollock 02/2013)   Past Surgical History  Procedure Laterality Date  . Total abdominal hysterectomy    . Thyroid surgery  2007  . Cystoscopy  02/26/2013    Dr. Jeffie Pollock (normal; follicular cystitis)   Social History:  reports that she has been smoking Cigarettes.  She has a 4 pack-year smoking history. She does not have any smokeless tobacco history on file. She reports that she drinks alcohol. She reports that she does not use illicit drugs.  No Known Allergies  Family History  Problem Relation Age of Onset  . Stroke Mother   . Coronary artery disease Mother   . Kidney disease Mother     on dialysis  . Cancer Father     bone cancer  .  Arthritis Sister     rheumatoid and osteoarthritis  . Arthritis Sister     RA and OA     Prior to Admission medications   Medication Sig Start Date End Date Taking? Authorizing Provider  Ascorbic Acid (VITAMIN C) 500 MG tablet Take 500 mg by mouth daily.    Yes Historical Provider, MD  cholecalciferol (VITAMIN D) 1000 UNITS tablet Take 1,000 Units by mouth daily.   Yes Historical Provider, MD  diclofenac sodium (VOLTAREN) 1 % GEL Apply 1 application topically 4 (four) times daily.   Yes Historical Provider, MD  levothyroxine (SYNTHROID, LEVOTHROID) 150 MCG tablet Take 150 mcg by mouth daily before breakfast.   Yes Historical Provider, MD  Multiple Vitamin (MULTIVITAMIN) tablet Take 1 tablet by mouth daily.   Yes Historical Provider, MD  naproxen (NAPROSYN) 500 MG tablet Take 500 mg by mouth every 12 (twelve) hours.   Yes Historical Provider, MD  traMADol (ULTRAM) 50 MG tablet Take 50 mg by mouth every 6 (six) hours as needed for pain.   Yes Historical Provider, MD  pregabalin (LYRICA) 150 MG capsule Take 1 capsule (150 mg total) by mouth 2 (two) times daily. 12/04/13   Harriet Masson, DPM   Physical Exam: Filed Vitals:   12/05/13 1600  BP: 126/86  Pulse: 53  Temp:   Resp: 15  BP 126/86  Pulse 53  Temp(Src) 97.8 F (36.6 C) (Oral)  Resp 15  Ht _0  (1.702 m)  Wt 62.596 kg (138 lb)  BMI 21.61 kg/m2  SpO2 98%  General:  Appears calm and comfortable Eyes: PERRL, normal lids, irises & conjunctiva ENT: grossly normal hearing, lips & tongue Neck: no LAD, masses or thyromegaly Cardiovascular: RRR, no m/r/g. No LE edema. Telemetry: SR, no arrhythmias  Respiratory: CTA bilaterally, no w/r/r. Normal respiratory effort. Abdomen: soft, ntnd, mild epigastric tenderness.  Skin: no rash or induration seen on limited exam Musculoskeletal: grossly normal tone BUE/BLE Psychiatric: grossly normal mood and affect, speech fluent and appropriate Neurologic: grossly non-focal.           Labs on Admission:  Basic Metabolic Panel:  Recent Labs Lab 12/05/13 1255  NA 141  K 4.1  CL 101  CO2 26  GLUCOSE 87  BUN 9  CREATININE 0.73  CALCIUM 9.3   Liver Function Tests:  Recent Labs Lab 12/05/13 1255  AST 19  ALT 9  ALKPHOS 93  BILITOT 0.5  PROT 8.0  ALBUMIN 4.3    Recent Labs Lab 12/05/13 1255  LIPASE 23   No results found for this basename: AMMONIA,  in the last 168 hours CBC:  Recent Labs Lab 12/05/13 1255  WBC 5.5  HGB 13.4  HCT 37.7  MCV 79.0  PLT 284   Cardiac Enzymes: No results found for this basename: CKTOTAL, CKMB, CKMBINDEX, TROPONINI,  in the last 168 hours  BNP (last 3 results) No results found for this basename: PROBNP,  in the last 8760 hours CBG: No results found for this basename: GLUCAP,  in the last 168 hours  Radiological Exams on Admission: Dg Chest 2 View  12/05/2013   CLINICAL DATA:  Chest pain, fever, shortness of Breath  EXAM: CHEST  2 VIEW  COMPARISON:  05/30/2012  FINDINGS: Cardiomediastinal silhouette is stable. No acute infiltrate or pleural effusion. No pulmonary edema. Mild degenerative changes thoracic spine. Again noted chronic mild interstitial prominence.  IMPRESSION: No active cardiopulmonary disease.   Electronically Signed   By: Lahoma Crocker M.D.   On: 12/05/2013 12:40   Dg Foot Complete Right  12/04/2013   X-ray report 3 views right foot AP lateral and lateral oblique views are examined.  Soft tissue structures otherwise unremarkable mild osteopenic changes  noted on osseous evaluation sesamoids rectus hallux is rectus adductovarus  rotation lesser digits 34 and 5 noted on AP view. Oblique view  unremarkable there may be some mild arthrosis Lisfranc for fifth met base  and cuneiform articulation site however no fractures noted no cysts or  tumors noted lateral projection reveals some thickening of fascial  structures mild retrocalcaneal spur no inferior calcaneal spurring noted  cyma line intact subtalar joint  intact posterior middle facets identified.  Synovitis of the first metatarsal noted on lateral projection.  Impression rectus foot type mild plantar fascial thickening mild arthrosis  Lisfranc joint fourth fifth met cuneiform base otherwise unremarkable foot  structures noted  Harriet Masson DPM   EKG: Independently reviewed. Bradycardia, no significant ST elevation.   Assessment/Plan Active Problems:   HYPOTHYROIDISM   COPD   Bradycardia   Fibromyalgia   Chest pain   1-Chest pain; with typical and atypical features. Admit to telemetry rule out ACS. Cycle cardiac enzymes. Will order ECHO due to dyspnea on exertion. PRN nitroglycerin. Aspirin. Check lipid panel. TSH.  2-Fibromyalgia; continue with lyrical.  3-Hypothyroidism" Continue with synthroid.  Code Status: Presume full code.  Family Communication: Care discussed with patient.  Disposition Plan: admit to observation.   Time spent: 75 minutes.   [facility] Pager [phone]

## 2013-12-05 NOTE — ED Notes (Signed)
Patient states started getting "tight in the chest with shortness of breath this morning".   Patient denies any other symptoms.

## 2013-12-06 DIAGNOSIS — E039 Hypothyroidism, unspecified: Secondary | ICD-10-CM

## 2013-12-06 DIAGNOSIS — I369 Nonrheumatic tricuspid valve disorder, unspecified: Secondary | ICD-10-CM

## 2013-12-06 LAB — LIPID PANEL
Cholesterol: 243 mg/dL — ABNORMAL HIGH (ref 0–200)
HDL: 50 mg/dL (ref >39)
LDL Cholesterol: 176 mg/dL — ABNORMAL HIGH (ref 0–99)
TRIGLYCERIDES: 84 mg/dL (ref <150)
Total CHOL/HDL Ratio: 4.9 RATIO
VLDL: 17 mg/dL (ref 0–40)

## 2013-12-06 LAB — TROPONIN I
Troponin I: <0.3 ng/mL (ref <0.30)
Troponin I: <0.3 ng/mL (ref <0.30)

## 2013-12-06 LAB — TSH: TSH: 20.181 u[IU]/mL — ABNORMAL HIGH (ref 0.350–4.500)

## 2013-12-06 MED ORDER — SODIUM CHLORIDE 0.9 % IV SOLN: INTRAVENOUS | Status: DC

## 2013-12-06 MED ORDER — NICOTINE 14 MG/24HR TD PT24
14.0000 mg | MEDICATED_PATCH | TRANSDERMAL | Status: DC
  Filled 2013-12-06: qty 1

## 2013-12-06 MED ORDER — NICOTINE 14 MG/24HR TD PT24: 14.0000 mg | MEDICATED_PATCH | TRANSDERMAL | Status: DC

## 2013-12-06 MED ORDER — SODIUM CHLORIDE 0.9 % IV BOLUS (SEPSIS): 500.0000 mL | Freq: Once | INTRAVENOUS | Status: DC

## 2013-12-06 NOTE — Progress Notes (Signed)
Pt ambulated in hallway using rolling walker with a staff member. Pt denies dizziness, CP/SOB while ambulating. HR remains 50-60s NSR. Dr Tyrell Antonio made aware. Dorna Bloom, RN

## 2013-12-06 NOTE — Progress Notes (Signed)
  Echocardiogram 2D Echocardiogram has been performed.  Diamond Nickel 12/06/2013, 3:13 PM

## 2013-12-06 NOTE — Progress Notes (Signed)
Chest pain unit rounds. Chart reviewed. Patient examined. No further chest pain. Pain was atypical. EKG shows no ischemic changes. Troponins negative x3. Her TSH is elevated. Cholesterol is elevated possibly exacerbated by underactive thyroid. She is awaiting 2D echo. If echo is okay, no further inpatient cardiac workup. Treat hypothyroidism and consider outpatient treadmill stress test later if chest pain recurs.

## 2013-12-06 NOTE — Progress Notes (Signed)
UR completed 

## 2013-12-06 NOTE — Discharge Summary (Signed)
Physician Discharge Summary  Emma Wilson PIR:[id number] DOB: 1956-07-05 DOA: 12/05/2013  PCP: Vikki Ports, MD  Admit date: 12/05/2013 Discharge date: 12/06/2013  Time spent: 42minutes  Recommendations for Outpatient Follow-up:  1. Need follow up on her BP, might need BP medications. 2. Follow up with Primary Cardiology for further evaluation of dyspnea. Might also need PFT.  3. Need repeat TSH.   Discharge Diagnoses:    Chest pain, atypical.    HYPOTHYROIDISM, uncontrolled.    COPD   Bradycardia   Fibromyalgia   Chest pain   Discharge Condition: Stable.   Diet recommendation: Heart Healthy  Filed Weights   12/05/13 1211 12/05/13 1924  Weight: 62.596 kg (138 lb) 62.279 kg (137 lb 4.8 oz)    History of present illness:  HPI: Emma Wilson is a 58 y.o. female with PMH significant for Hypothyroidism, RA, Sinus Bradycardia , she had stress test 2010 which was normal. She presents complaining of chest pain , pressure like, that started the morning of admission. Pain has resolved. She rate pain at that time 5/10. Chest pain is bilaterally, bellow her breast. She also relates SOB when she was lying flat this AM. She does relates dyspnea on exertion. She denies nausea, vomiting.    Hospital Course:  1-Chest pain, dyspnea. , Atypical; patient was admitted to telemetry. Troponin times 3 negative. No significant EKG changes. Last Myoview was 2010 , which was normal. ECHO with no wall motion abnormalities and normal EF. BNP 66. No evidence of heart failure. TSH elevated at 20.   2-Hypothyroidism: patient confirm than she is not taking her synthroid as she should. Will continue with current dose. I have advised patient to take her medications.   3-Dizziness; she report dizziness this am. Orthostatic negative. She was able to ambulate on the hall. Neuro exam non focal. She was not taking lyrica. Will stop this medication. She probably need lower dose. She will needs to follow up with  PCP.    Procedures: ECHO;  Left ventricle: The cavity size was normal. Wall thickness was increased in a pattern of mild LVH. Systolic function was normal. The estimated ejection fraction was in the range of 60% to 65%. Wall motion was normal; there were no regional wall motion abnormalities. Doppler parameters are consistent with abnormal left ventricular relaxation (grade 1 diastolic dysfunction).     Consultations:  Cardio  Discharge Exam: Filed Vitals:   12/06/13 1559  BP: 126/71  Pulse: 53  Temp: 97.8 F (36.6 C)  Resp: 18    General:No distress.  Cardiovascular: S 1, S 2 RRR Respiratory: CTA Neuro exam; Non focal  Discharge Instructions     Medication List    ASK your doctor about these medications       cholecalciferol 1000 UNITS tablet  Commonly known as:  VITAMIN D  Take 1,000 Units by mouth daily.     diclofenac sodium 1 % Gel  Commonly known as:  VOLTAREN  Apply 1 application topically 4 (four) times daily.     levothyroxine 150 MCG tablet  Commonly known as:  SYNTHROID, LEVOTHROID  Take 150 mcg by mouth daily before breakfast.     multivitamin tablet  Take 1 tablet by mouth daily.     naproxen 500 MG tablet  Commonly known as:  NAPROSYN  Take 500 mg by mouth every 12 (twelve) hours.     pregabalin 150 MG capsule  Commonly known as:  LYRICA  Take 1 capsule (150 mg total) by mouth  2 (two) times daily.     traMADol 50 MG tablet  Commonly known as:  ULTRAM  Take 50 mg by mouth every 6 (six) hours as needed for pain.     vitamin C 500 MG tablet  Commonly known as:  ASCORBIC ACID  Take 500 mg by mouth daily.       No Known Allergies     Follow-up Information   Follow up with KNAPP,EVE A, MD In 1 week.   Specialty:  Family Medicine   Contact information:   [place] [address] [phone]       Follow up with Kirk Ruths, MD In 2 weeks.   Specialty:  Cardiology   Contact information:   [address] [place] [address] [phone]        The results of significant diagnostics from this hospitalization (including imaging, microbiology, ancillary and laboratory) are listed below for reference.    Significant Diagnostic Studies: Dg Chest 2 View  12/05/2013   CLINICAL DATA:  Chest pain, fever, shortness of Breath  EXAM: CHEST  2 VIEW  COMPARISON:  05/30/2012  FINDINGS: Cardiomediastinal silhouette is stable. No acute infiltrate or pleural effusion. No pulmonary edema. Mild degenerative changes thoracic spine. Again noted chronic mild interstitial prominence.  IMPRESSION: No active cardiopulmonary disease.   Electronically Signed   By: Lahoma Crocker M.D.   On: 12/05/2013 12:40   Dg Foot Complete Right  12/04/2013   X-ray report 3 views right foot AP lateral and lateral oblique views are examined.  Soft tissue structures otherwise unremarkable mild osteopenic changes  noted on osseous evaluation sesamoids rectus hallux is rectus adductovarus  rotation lesser digits 34 and 5 noted on AP view. Oblique view  unremarkable there may be some mild arthrosis Lisfranc for fifth met base  and cuneiform articulation site however no fractures noted no cysts or  tumors noted lateral projection reveals some thickening of fascial  structures mild retrocalcaneal spur no inferior calcaneal spurring noted  cyma line intact subtalar joint intact posterior middle facets identified.  Synovitis of the first metatarsal noted on lateral projection.  Impression rectus foot type mild plantar fascial thickening mild arthrosis  Lisfranc joint fourth fifth met cuneiform base otherwise unremarkable foot  structures noted  Harriet Masson DPM   Microbiology: No results found for this or any previous visit (from the past 240 hour(s)).   Labs: Basic Metabolic Panel:  Recent Labs Lab 12/05/13 1255  NA 141  K 4.1  CL 101  CO2 26  GLUCOSE 87  BUN 9  CREATININE 0.73  CALCIUM 9.3   Liver Function  Tests:  Recent Labs Lab 12/05/13 1255  AST 19  ALT 9  ALKPHOS 93  BILITOT 0.5  PROT 8.0  ALBUMIN 4.3    Recent Labs Lab 12/05/13 1255  LIPASE 23   No results found for this basename: AMMONIA,  in the last 168 hours CBC:  Recent Labs Lab 12/05/13 1255  WBC 5.5  HGB 13.4  HCT 37.7  MCV 79.0  PLT 284   Cardiac Enzymes:  Recent Labs Lab 12/05/13 1804 12/05/13 2336 12/06/13 0655  TROPONINI <0.30 <0.30 <0.30   BNP: BNP (last 3 results)  Recent Labs  12/05/13 1804  PROBNP 66.5   CBG: No results found for this basename: GLUCAP,  in the last 168 hours     Signed:  Timea Breed  [facility] 12/06/2013, 4:05 PM

## 2013-12-06 NOTE — Progress Notes (Signed)
Pt provided with dc instructions and education. Verbalized understanding. Pt has no questions and this time. IV removed with tip intact. Heart monitor cleaned and returned to front. Dorna Bloom, RN

## 2013-12-19 ENCOUNTER — Encounter: Payer: Self-pay | Admitting: Family Medicine

## 2013-12-19 ENCOUNTER — Ambulatory Visit (INDEPENDENT_AMBULATORY_CARE_PROVIDER_SITE_OTHER): Payer: Managed Care, Other (non HMO) | Admitting: Family Medicine

## 2013-12-19 VITALS — BP 122/80 | HR 64 | Ht 66.0 in | Wt 138.0 lb

## 2013-12-19 DIAGNOSIS — E039 Hypothyroidism, unspecified: Secondary | ICD-10-CM

## 2013-12-19 DIAGNOSIS — R0609 Other forms of dyspnea: Secondary | ICD-10-CM

## 2013-12-19 DIAGNOSIS — R0600 Dyspnea, unspecified: Secondary | ICD-10-CM

## 2013-12-19 DIAGNOSIS — R079 Chest pain, unspecified: Secondary | ICD-10-CM

## 2013-12-19 DIAGNOSIS — R0989 Other specified symptoms and signs involving the circulatory and respiratory systems: Secondary | ICD-10-CM

## 2013-12-19 DIAGNOSIS — F17200 Nicotine dependence, unspecified, uncomplicated: Secondary | ICD-10-CM

## 2013-12-19 DIAGNOSIS — Z202 Contact with and (suspected) exposure to infections with a predominantly sexual mode of transmission: Secondary | ICD-10-CM

## 2013-12-19 NOTE — Patient Instructions (Addendum)
Take your thyroid medication on empty stomach, at same time every day, separate from other medications and vitamins. Return in 6 weeks to have your blood tested again.  If it is still underactive, dose will need to be changed.  Schedule follow-up appointment with cardiologist.  If you develop change in vaginal symptoms--increased discharge, odor, pain--then return for full pelvic exam for additional testing for infections  I recommend counseling--try going through your EAP  Please try and quit smoking--start thinking about why/when you smoke (habit, boredom, stress) in order to come up with effective strategies to cut back or quit. Available resources to help you quit include free counseling through [facility] ([facility]uit[facility] or 1-800-QUITNOW), smoking cessation classes through [facility] (call to find out schedule), over-the-counter nicotine replacements, and e-cigarettes (although this may not help break the hand-mouth habit).  Many insurance companies also have smoking cessation programs (which may decrease the cost of patches, meds if enrolled).  If these methods are not effective for you, and you are motivated to quit, return to discuss the possibility of prescription medications.

## 2013-12-19 NOTE — Progress Notes (Signed)
Chief Complaint  Patient presents with  . Hospitalization Follow-up    discharged 12/05/13, was seen for chest pain/pressure. All meds reconciled.   . Std check    just found out that she was not in a monogamous relationship and would like to be checked.    Patient presents for hospital follow-up. She went to ER 1/21 with chest pressure, hospitalized overnight.  See info from discharge summary below.  She found out that her boyfriend hasn't been faithful, has had multiple other partners, and also embezzled money.  She found out around 1/18, and she kept learning something new every day, kept getting worse.  She has been with him for 4 years.  She thinks she developed the chest pressure from stress.  Even at work last night, people are still coming up to her and telling her something new about him every day.    Pressure had resolved in the hospital, hadn't had any since, until last night when she was working hard, rushing, and still having some stressors related to her partner (see above).    Her job is very physical, and sometimes will have some shortness of breath.  She takes belly dancing, hasn't had problems with shortness of breath while dancing.  She hasn't yet scheduled cardiology follow-up.  She continues to smoke 1/2 PPD.  Hypothyroidism:  Her hours changed at work, and so she ended up taking her medication more sporadically, later in the day.  She is now in a routine of taking it as soon as she gets home from work.  She is taking meds on an empty stomach, and not with any other vitamins.  Denies any hair/skin/bowel or weight changes.  Moods are up and down, related partly to pain, fibromyalgia.  She denies symptoms of STD's--she has some vaginal discharge, but not really different from her norm.  Not discolored, no itching, no odor.    Info from discharge summary: Admit date: 12/05/2013  Discharge date: 12/06/2013   Recommendations for Outpatient Follow-up:  1. Need follow up on her BP,  might need BP medications. 2. Follow up with Primary Cardiology for further evaluation of dyspnea. Might also need PFT.  3. Need repeat TSH.  Discharge Diagnoses:  Chest pain, atypical.  HYPOTHYROIDISM, uncontrolled.  COPD  Bradycardia  Fibromyalgia  Chest pain  Hospital Course:  1-Chest pain, dyspnea. , Atypical; patient was admitted to telemetry. Troponin times 3 negative. No significant EKG changes. Last Myoview was 2010 , which was normal. ECHO with no wall motion abnormalities and normal EF. BNP 66. No evidence of heart failure. TSH elevated at 20.  2-Hypothyroidism: patient confirm than she is not taking her synthroid as she should. Will continue with current dose. I have advised patient to take her medications.  3-Dizziness; she report dizziness this am. Orthostatic negative. She was able to ambulate on the hall. Neuro exam non focal. She was not taking lyrica. Will stop this medication. She probably need lower dose. She will needs to follow up with PCP.   Patient had actually not filled the Lyrica yet, has prescription.  She previously took it at the same dose with good effects, and no side effects. Has cost-savings card to help make it affordable.  She also tried amitryiptiline, as it was less expensive, but it was not effective.   Past Medical History  Diagnosis Date  . COPD (chronic obstructive pulmonary disease)   . GERD (gastroesophageal reflux disease)   . Sinus bradycardia   . Hypothyroidism   .  Hematuria     microscopic hematuria (urol & nephrol w/u neg), multiple UTI`s  . Fibromyalgia   . Carpal tunnel syndrome     right wrist  . Hyperlipidemia     managed by Dr. Ouida Sills  . Microscopic hematuria     glomerular from sickle cell trait (per Dr. Jeffie Pollock 02/2013)  . Pneumonia     "abuntantly as a child"  . Shortness of breath     "at rest" (12/05/2013)  . Migraine     "q 3-4 months" (12/05/2013)  . Chronic back pain   . Osteoarthritis     Rheumatoid and OA.  sees Dr.  Ouida Sills [facility] medical, rheumatology)  . Rheumatoid arthritis   . Depression 2006    "after my son passed" (12/05/2013)  . Thyroid cancer     S/P radiation & OR   Past Surgical History  Procedure Laterality Date  . Thyroidectomy, partial  11/2005; 11/2006  . Cystoscopy  02/26/2013    Dr. Jeffie Pollock (normal; follicular cystitis)  . Tubal ligation  1980's  . Total abdominal hysterectomy  ~ 1990   History   Social History  . Marital Status: Divorced    Spouse Name: N/A    Number of Children: 2  . 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 Smoking status: Current Every Day Smoker -- 0.50 packs/day for 34 years    Types: Cigarettes  . Smokeless tobacco: Never Used  . Alcohol Use: Yes     Comment: 12/05/2013 "glass of wine 1-2 times/yr"  . Drug Use: No  . Sexual Activity: Not Currently    Partners: Male   Other Topics Concern  . Not on file   Social History Narrative   Lives alone.  2 living children (1 deceased), 1 in [place], 1 in [place], 6 grandchildren    Outpatient Encounter Prescriptions as of 12/19/2013  Medication Sig  . Ascorbic Acid (VITAMIN C) 500 MG tablet Take 500 mg by mouth daily.   . cholecalciferol (VITAMIN D) 1000 UNITS tablet Take 1,000 Units by mouth daily.  . diclofenac sodium (VOLTAREN) 1 % GEL Apply 1 application topically 4 (four) times daily.  Marland Kitchen levothyroxine (SYNTHROID, LEVOTHROID) 150 MCG tablet Take 150 mcg by mouth daily before breakfast.  . LYRICA 150 MG capsule Take 150 mg by mouth 2 (two) times daily.   . Multiple Vitamin (MULTIVITAMIN) tablet Take 1 tablet by mouth daily.  . naproxen (NAPROSYN) 500 MG tablet Take 500 mg by mouth every 12 (twelve) hours.  . traMADol (ULTRAM) 50 MG tablet Take 50 mg by mouth every 6 (six) hours as needed for pain.  . nicotine (NICODERM CQ - DOSED IN MG/24 HOURS) 14 mg/24hr patch Place 1 patch (14 mg total) onto the skin daily.  (not  taking lyrica yet).  No Known Allergies  ROS:  Denies fevers, chills, URI symptoms, cough, headaches, dizziness, nausea, vomiting, diarrhea, skin rash, bleeding, bruising, GI complaints, dysuria.  See HPI.  PHYSICAL EXAM: BP 122/80  Pulse 64  Ht 5\' 6"  (1.676 m)  Wt 138 lb (62.596 kg)  BMI 22.28 kg/m2 Well developed, pleasant female who doesn't appear to be in any discomfort or distress today. Somewhat unusual affect--smiling and pleasant, but saying some angry statements re: her partner (ex).  Made some comments about running him over in the street if she saw him--but contracted verbally that she would never do that. Neck: no lymphadenopathy,  thyromegaly or carotid bruit Heart: regular rate and rhythm Lungs: clear bilaterally. Fair air movement; no wheezes, rales, ronchi Chest: Mildly tender in mid-sternum Abdomen: Active bowel sounds, minimally tender across left upper abdomen. No organomegaly or mass Extremities: no edema Skin: no rashes Neuro: alert and oriented.  Cranial nerves intact. Normal gait, strength  ASSESSMENT/PLAN:  Exposure to STD - return for pelvic exam if increasing vaginal discharge, any pelvic pain, fever.  - Plan: HIV antibody, RPR, Hepatitis B surface antigen, GC/Chlamydia Probe Amp  Unspecified hypothyroidism - elevated TSH in hospital, not taking med properly. Now compliant.  returnin 6 weeks for TSH, and adjust dose then if needed.  euthyroid by hx - Plan: TSH  Tobacco use disorder - encouraged cessation  Chest pain - atypical.  some reproducible component/tender on palpation, may be related to her fibromyalgia.  f/u with cardiology, might need OP stress test  Dyspnea - may have some pulmonary component, related to smoking/mild COPD.  consider PFT's in future.  encouraged to quit smoking   STD check--RPR, HIV, urine screen for Chlamydia and GC. Return if vaginal discharge changes for further evaluation with wet prep--advised that only way to eval for  certain STD's (ie trich) is with exam.  She is not undressed today and declines pelvic exam  Encouraged to quit smoking Encouraged to f/u with cardiology as recommended by hospitalist  She is now in a more routine pattern of taking thyroid medication.  Return in 6 weeks for repeat TSH, and adjust dose if needed.  Encouraged counseling through EAP  Consider starting lyrica slower than 150mg  BID

## 2013-12-20 LAB — GC/CHLAMYDIA PROBE AMP
CT PROBE, AMP APTIMA: NEGATIVE
GC Probe RNA: NEGATIVE

## 2013-12-20 LAB — HEPATITIS B SURFACE ANTIGEN: HEP B S AG: NEGATIVE

## 2013-12-20 LAB — RPR

## 2013-12-20 LAB — HIV ANTIBODY (ROUTINE TESTING W REFLEX): HIV: NONREACTIVE

## 2014-01-08 ENCOUNTER — Encounter: Payer: Self-pay | Admitting: Nurse Practitioner

## 2014-01-08 ENCOUNTER — Ambulatory Visit (INDEPENDENT_AMBULATORY_CARE_PROVIDER_SITE_OTHER): Payer: 59 | Admitting: Nurse Practitioner

## 2014-01-08 VITALS — BP 130/60 | HR 60 | Ht 67.0 in | Wt 136.0 lb

## 2014-01-08 DIAGNOSIS — R079 Chest pain, unspecified: Secondary | ICD-10-CM

## 2014-01-08 NOTE — Patient Instructions (Signed)
We will arrange for a stress test (GXT) - if this turns out ok - we will see you back as needed  Try to stop smoking  Stay on your current medicines for now  Call the [place] office at [phone] if you have any questions, problems or concerns.

## 2014-01-08 NOTE — Progress Notes (Signed)
[name] Date of Birth: 1955-11-28 Medical Record #[id number]  History of Present Illness: Emma Wilson is seen back today for a post hospital visit. Seen for Dr. Stanford Breed. She is a 58 year old female with uncontrolled hypothyroidism, COPD, bradycardia, fibromyalgia and past stress test in 2010 which was normal. She continues to smoke. She has noncompliance noted.   Most recently in the hospital back in January with atypical chest pain. She had a negative evaluation with negative troponins. EKG negative. Echo ok. TSH was elevated at 20 and she confirmed that she was not taking her synthroid as she was suppose to be taking. Outpatient treadmill was to be considered.   Comes back today. Here alone. Says she has continued to have chest pain off and on - very vague in her description - feels like it is stress related and due to her fibromyalgia. Does not seem to be exertional. Working "wierd" shifts with the [employer]. She says she is taking her medicines now. Still smoking but thinking about using the patches to stop. Has been back to her PCP - that note was reviewed - lots of stress noted with prior relationship.   Current Outpatient Prescriptions  Medication Sig Dispense Refill  . Ascorbic Acid (VITAMIN C) 500 MG tablet Take 500 mg by mouth daily.       . cholecalciferol (VITAMIN D) 1000 UNITS tablet Take 1,000 Units by mouth daily.      . diclofenac sodium (VOLTAREN) 1 % GEL Apply 1 application topically 4 (four) times daily.      Marland Kitchen levothyroxine (SYNTHROID, LEVOTHROID) 150 MCG tablet Take 137 mcg by mouth daily before breakfast.       . LYRICA 150 MG capsule Take 150 mg by mouth 2 (two) times daily.       . Multiple Vitamin (MULTIVITAMIN) tablet Take 1 tablet by mouth daily.      . naproxen (NAPROSYN) 500 MG tablet Take 500 mg by mouth every 12 (twelve) hours.      . traMADol (ULTRAM) 50 MG tablet Take 50 mg by mouth every 6 (six) hours as needed for pain.       No current  facility-administered medications for this visit.    No Known Allergies  Past Medical History  Diagnosis Date  . COPD (chronic obstructive pulmonary disease)   . GERD (gastroesophageal reflux disease)   . Sinus bradycardia   . Hypothyroidism   . Hematuria     microscopic hematuria (urol & nephrol w/u neg), multiple UTI`s  . Fibromyalgia   . Carpal tunnel syndrome     right wrist  . Hyperlipidemia     managed by Dr. Ouida Sills  . Microscopic hematuria     glomerular from sickle cell trait (per Dr. Jeffie Pollock 02/2013)  . Pneumonia     "abuntantly as a child"  . Shortness of breath     "at rest" (12/05/2013)  . Migraine     "q 3-4 months" (12/05/2013)  . Chronic back pain   . Osteoarthritis     Rheumatoid and OA.  sees Dr. Ouida Sills [facility] medical, rheumatology)  . Rheumatoid arthritis   . Depression 2006    "after my son passed" (12/05/2013)  . Thyroid cancer     S/P radiation & OR    Past Surgical History  Procedure Laterality Date  . Thyroidectomy, partial  11/2005; 11/2006  . Cystoscopy  02/26/2013    Dr. Jeffie Pollock (normal; follicular cystitis)  . Tubal ligation  1980's  .  Total abdominal hysterectomy  ~ 1990    History  Smoking status  . Current Every Day Smoker -- 0.50 packs/day for 34 years  . Types: Cigarettes  Smokeless tobacco  . Never Used    History  Alcohol Use  . Yes    Comment: 12/05/2013 "glass of wine 1-2 times/yr"    Family History  Problem Relation Age of Onset  . Stroke Mother   . Coronary artery disease Mother   . Kidney disease Mother     on dialysis  . Cancer Father     bone cancer  . Arthritis Sister     rheumatoid and osteoarthritis  . Arthritis Sister     RA and OA    Review of Systems: The review of systems is per the HPI.  All other systems were reviewed and are negative.  Physical Exam: BP 130/60  Pulse 60  Ht 5\' 7"  (1.702 m)  Wt 136 lb (61.689 kg)  BMI 21.30 kg/m2  SpO2 98% Patient is very pleasant and in no acute distress. Skin  is warm and dry. Color is normal.  HEENT is unremarkable. Normocephalic/atraumatic. PERRL. Sclera are nonicteric. Neck is supple. No masses. No JVD. Lungs are clear. Cardiac exam shows a regular rate and rhythm. Abdomen is soft. Extremities are without edema. Gait and ROM are intact. No gross neurologic deficits noted.  LABORATORY DATA:  Lab Results  Component Value Date   WBC 5.5 12/05/2013   HGB 13.4 12/05/2013   HCT 37.7 12/05/2013   PLT 284 12/05/2013   GLUCOSE 87 12/05/2013   CHOL 243* 12/06/2013   TRIG 84 12/06/2013   HDL 50 12/06/2013   LDLCALC 176* 12/06/2013   ALT 9 12/05/2013   AST 19 12/05/2013   NA 141 12/05/2013   K 4.1 12/05/2013   CL 101 12/05/2013   CREATININE 0.73 12/05/2013   BUN 9 12/05/2013   CO2 26 12/05/2013   TSH 20.181* 12/05/2013   Echo Study Conclusions  Left ventricle: The cavity size was normal. Wall thickness was increased in a pattern of mild LVH. Systolic function was normal. The estimated ejection fraction was in the range of 60% to 65%. Wall motion was normal; there were no regional wall motion abnormalities. Doppler parameters are consistent with abnormal left ventricular relaxation (grade 1 diastolic dysfunction).    Assessment / Plan: 1. Atypical chest pain - has persisted - she has HLD and ongoing tobacco abuse. Will arrange for stress testing - I do not feel she is going to be able to walk on the treadmill - she is using a cane and moves pretty slow here. She is quite adamant that she can walk. Will start with GXT per the patient's request.   2. Hypothyroidism - uncontrolled due to medication noncompliance - she says she is taking  3. HLD - may improve with improvement of her thyroid function - she is not interested in lipid lowering therapy at this time. Will defer to PCP.  Patient is agreeable to this plan and will call if any problems develop in the interim.   Burtis Junes, RN, [facility] [address]  [place], [place]  [address] [phone]

## 2014-01-11 ENCOUNTER — Other Ambulatory Visit: Payer: Self-pay | Admitting: Family Medicine

## 2014-01-17 ENCOUNTER — Ambulatory Visit (HOSPITAL_COMMUNITY)
Admission: RE | Admit: 2014-01-17 | Discharge: 2014-01-17 | Disposition: A | Discharge status: Home / Self Care | Payer: 59 | Source: Ambulatory Visit | Attending: Cardiology | Admitting: Cardiology

## 2014-01-17 DIAGNOSIS — R079 Chest pain, unspecified: Secondary | ICD-10-CM | POA: Insufficient documentation

## 2014-01-28 ENCOUNTER — Ambulatory Visit
Admission: RE | Admit: 2014-01-28 | Discharge: 2014-01-28 | Disposition: A | Discharge status: Home / Self Care | Payer: 59 | Source: Ambulatory Visit | Attending: Family Medicine | Admitting: Family Medicine

## 2014-01-28 ENCOUNTER — Ambulatory Visit (INDEPENDENT_AMBULATORY_CARE_PROVIDER_SITE_OTHER): Payer: Managed Care, Other (non HMO) | Admitting: Family Medicine

## 2014-01-28 ENCOUNTER — Encounter: Payer: Self-pay | Admitting: Family Medicine

## 2014-01-28 VITALS — BP 120/78 | HR 56 | Ht 66.0 in | Wt 138.0 lb

## 2014-01-28 DIAGNOSIS — M25551 Pain in right hip: Secondary | ICD-10-CM

## 2014-01-28 DIAGNOSIS — M25559 Pain in unspecified hip: Secondary | ICD-10-CM

## 2014-01-28 NOTE — Patient Instructions (Signed)
  Right hip pain--likely has component of soft tissue (pt with fibromyalgia, perhaps mild hip flexor tendinitis), but with pain with ROM, would like to check x-ray to evaluate for underlying arthritis.  Continue naproxen.  Use tylenol arthritis as needed for pain.  Continue with heat, stretches (as shown, after heat).    Go to [place] (today or at your convenience, any time between 8:30-4, at either [address] or [place]) for x-ray of your hip.

## 2014-01-28 NOTE — Progress Notes (Signed)
Chief Complaint  Patient presents with  . Hip Pain    right hip pain x several weeks. Also has appt scheduled for this Wed with you-according to last OV note she only needs to come in 6 weeks (~01/30/14 for TSH lab draw) can you please verify and she will switch appt when she checks out. (all meds reconciled-no longer taking amitrip)   Patient is complaining of pain at right groin, and also in her low back/buttock, on the right side.  She is getting PT--has been in the water for therapy, and working on balance outside of the pool.  Not doing strengthening, weights.  Does "bicycle" in inner tube in the water, not with any weights.  Pain has been gradual in onset, started about 2 weeks ago.  Thought she "overdid" something, perhaps at PT.  Has gradually gotten worse.  Hasn't taken any medications to treat the pain.  Pain is the same whether she had PT or not, seems to get worse throughout the day.  A couple of times she noticed the pain when she woke up, thought she "laid wrong".  She is getting therapy for right-sided weakness (moreso than the left side which also has some weakness).  She takes Naproxen 500mg  twice daily, regularly.  This is prescribed by Dr. Ouida Sills for arthritis and fibromyalgia.  Heat seems to help  Past Medical History  Diagnosis Date  . COPD (chronic obstructive pulmonary disease)   . GERD (gastroesophageal reflux disease)   . Sinus bradycardia   . Hypothyroidism   . Hematuria     microscopic hematuria (urol & nephrol w/u neg), multiple UTI`s  . Fibromyalgia   . Carpal tunnel syndrome     right wrist  . Hyperlipidemia     managed by Dr. Ouida Sills  . Microscopic hematuria     glomerular from sickle cell trait (per Dr. Jeffie Pollock 02/2013)  . Pneumonia     "abuntantly as a child"  . Shortness of breath     "at rest" (12/05/2013)  . Migraine     "q 3-4 months" (12/05/2013)  . Chronic back pain   . Osteoarthritis     Rheumatoid and OA.  sees Dr. Ouida Sills [facility] medical,  rheumatology)  . Rheumatoid arthritis   . Depression 2006    "after my son passed" (12/05/2013)  . Thyroid cancer     S/P radiation & OR   Past Surgical History  Procedure Laterality Date  . Thyroidectomy, partial  11/2005; 11/2006  . Cystoscopy  02/26/2013    Dr. Jeffie Pollock (normal; follicular cystitis)  . Tubal ligation  1980's  . Total abdominal hysterectomy  ~ 1990   History   Social History  . Marital Status: Divorced    Spouse Name: N/A    Number of Children: 2  . 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 Smoking status: Current Every Day Smoker -- 0.50 packs/day for 34 years    Types: Cigarettes  . Smokeless tobacco: Never Used  . Alcohol Use: Yes     Comment: 12/05/2013 "glass of wine 1-2 times/yr"  . Drug Use: No  . Sexual Activity: Not Currently    Partners: Male   Other Topics Concern  . Not on file   Social History Narrative   Lives alone.  2 living children (1 deceased), 1 in [place], 1 in [place], 6 grandchildren   Outpatient Encounter Prescriptions as of 01/28/2014  Medication Sig Note  . Ascorbic Acid (VITAMIN C) 500 MG tablet Take 500 mg by mouth daily.    . cholecalciferol (VITAMIN D) 1000 UNITS tablet Take 1,000 Units by mouth daily.   . diclofenac sodium (VOLTAREN) 1 % GEL Apply 1 application topically 4 (four) times daily.   Marland Kitchen LYRICA 150 MG capsule Take 150 mg by mouth 2 (two) times daily.    . Multiple Vitamin (MULTIVITAMIN) tablet Take 1 tablet by mouth daily.   . naproxen (NAPROSYN) 500 MG tablet Take 500 mg by mouth every 12 (twelve) hours.   Marland Kitchen SYNTHROID 150 MCG tablet TAKE 1 TABLET BY MOUTH ONCE DAILY   . traMADol (ULTRAM) 50 MG tablet Take 50 mg by mouth every 6 (six) hours as needed for pain.   . [DISCONTINUED] levothyroxine (SYNTHROID, LEVOTHROID) 150 MCG tablet Take 137 mcg by mouth daily before breakfast.  12/05/2013: Brand name   No Known Allergies ROS: denies  fevers, URI symptoms, urinary complaints.  +diffuse pain, weakness R>L.  Energy has been okay.  PHYSICAL EXAM: BP 120/78  Pulse 56  Ht 5\' 6"  (1.676 m)  Wt 138 lb (62.596 kg)  BMI 22.28 kg/m2  Well developed, well nourished female in no acute distress.  Walks with a cane.  Diffusely tender at spine and soft tissues/muscles in her back.  Her area of discomfort/current complaint in her back is at her right SI joint.  She does not have any pyriformis spasm, nor any focal muscle spasm in her back  Diffusely jumpy when touched--some because she is "ticklish" and some due to pain. She has pain upon superficial palpation at right groin--no mass or adenopathy. She has some mild discomfort with hip flexion against resistance on the right.  She has pain with abduction and adduction of hip, at the right groin.  No pain with ROM on the left.  ASSESSMENT/PLAN:  Right hip pain - Plan: DG Hip Complete Right  Right hip pain--likely has component of soft tissue (pt with fibromyalgia, perhaps mild hip flexor tendinitis), but with pain with ROM, would like to check x-ray to evaluate for underlying arthritis.  Continue naproxen.  Use tylenol arthritis as needed for pain.  Continue with heat, stretches (as shown, after heat).    Go to [place] (today or at your convenience, any time between 8:30-4, at either [address] or [place]) for x-ray of your hip.   Declines getting TSH done today--has to get home to change for a funeral. Will return another day for TSH (lab only)

## 2014-01-30 ENCOUNTER — Other Ambulatory Visit: Payer: 59

## 2014-01-30 DIAGNOSIS — E039 Hypothyroidism, unspecified: Secondary | ICD-10-CM

## 2014-01-31 ENCOUNTER — Other Ambulatory Visit: Payer: Self-pay | Admitting: *Deleted

## 2014-01-31 LAB — TSH: TSH: 0.092 u[IU]/mL — ABNORMAL LOW (ref 0.350–4.500)

## 2014-01-31 MED ORDER — SYNTHROID 137 MCG PO TABS
137.0000 ug | ORAL_TABLET | Freq: Every day | ORAL | Status: DC
Start: 2014-01-31 — End: 2015-03-27

## 2014-02-19 ENCOUNTER — Ambulatory Visit (INDEPENDENT_AMBULATORY_CARE_PROVIDER_SITE_OTHER): Payer: Managed Care, Other (non HMO) | Admitting: Medical

## 2014-02-19 ENCOUNTER — Encounter: Payer: Self-pay | Admitting: Medical

## 2014-02-19 VITALS — BP 100/70 | HR 56 | Temp 98.1°F | Resp 14 | Wt 137.0 lb

## 2014-02-19 DIAGNOSIS — R350 Frequency of micturition: Secondary | ICD-10-CM

## 2014-02-19 DIAGNOSIS — M797 Fibromyalgia: Secondary | ICD-10-CM

## 2014-02-19 DIAGNOSIS — IMO0001 Reserved for inherently not codable concepts without codable children: Secondary | ICD-10-CM

## 2014-02-19 DIAGNOSIS — J309 Allergic rhinitis, unspecified: Secondary | ICD-10-CM

## 2014-02-19 LAB — POCT URINALYSIS DIPSTICK
BILIRUBIN UA: NEGATIVE
Glucose, UA: NEGATIVE
Ketones, UA: NEGATIVE
Nitrite, UA: NEGATIVE
Spec Grav, UA: 1.015
Urobilinogen, UA: NEGATIVE
pH, UA: 5

## 2014-02-19 MED ORDER — MOMETASONE FUROATE 50 MCG/ACT NA SUSP: 2.0000 | Freq: Every day | NASAL | Status: DC

## 2014-02-19 MED ORDER — SULFAMETHOXAZOLE-TRIMETHOPRIM 400-80 MG PO TABS: 1.0000 | ORAL_TABLET | Freq: Two times a day (BID) | ORAL | Status: DC

## 2014-02-19 NOTE — Progress Notes (Signed)
Subjective:  Emma Wilson is a 58 y.o. female who complains of possible urinary tract infection.  She has had symptoms for 5 days.  Symptoms include urine is cloudy, has urinary discomfort with urination, odorous urine, some back pain, some nausea.  Always has blood in urine, hx/o microscopic hematuria, has prior urology workup (see chart).  Patient denies fever, no vaginal symptoms.  She was out of town recently, not drinking usual amount of water, so thinks this may have led to UTI, given changing routine.   No concern for STD.   Last UTI was about a year ago.   Using nothing for current symptoms.     Patient does not have a history of recurrent UTI. Patient does not have a history of pyelonephritis.  No other aggravating or relieving factors.    She does have some allergy symptoms currently with nose stopped up, congestion, sneezing.   Using nothing for this.    Has questions about the fibromyalgia study posted on our wall.  No other c/o.  Past Medical History  Diagnosis Date  . COPD (chronic obstructive pulmonary disease)   . GERD (gastroesophageal reflux disease)   . Sinus bradycardia   . Hypothyroidism   . Hematuria     microscopic hematuria (urol & nephrol w/u neg), multiple UTI`s  . Fibromyalgia   . Carpal tunnel syndrome     right wrist  . Hyperlipidemia     managed by Dr. Ouida Sills  . Microscopic hematuria     glomerular from sickle cell trait (per Dr. Jeffie Pollock 02/2013)  . Pneumonia     "abuntantly as a child"  . Shortness of breath     "at rest" (12/05/2013)  . Migraine     "q 3-4 months" (12/05/2013)  . Chronic back pain   . Osteoarthritis     Rheumatoid and OA.  sees Dr. Ouida Sills [facility] medical, rheumatology)  . Rheumatoid arthritis   . Depression 2006    "after my son passed" (12/05/2013)  . Thyroid cancer     S/P radiation & OR    ROS as in subjective  Reviewed allergies, medications, past medical, surgical, and social history.    Objective: Filed Vitals:   02/19/14 0942  BP: 100/70  Pulse: 56  Temp: 98.1 F (36.7 C)  Resp: 14    General appearance: alert, no distress, WD/WN, female Abdomen: +bs, soft, generalized abdominal tenderness, mild, non distended, no masses, no hepatomegaly, no splenomegaly, no bruits Back: generalized tenderness HENT - flat TMs, nasal turbinate edema with clear discharge, othewrise ENT unremarkable Lungs decreased breath sounds, but otherwise clear Ext no edema GU: deferred       Assessment: Encounter Diagnoses  Name Primary?  . Urinary frequency Yes  . Allergic rhinitis   . Fibromyalgia      Plan: Discussed symptoms, likely UTI diagnosis, possible complications, and usual course of illness.  Begin medication Bactrim per orders below.  Advised increased water intake, can use OTC Tylenol for pain.    Urine culture sent.  Begin sample of Nasonex for allergies.  I will pass along her interest in the fibromyalgia study to Dr. Tomi Bamberger.  Call or return if worse or not improving.

## 2014-02-19 NOTE — Patient Instructions (Signed)
  Thank you for giving me the opportunity to serve you today.    Your diagnosis today includes: Encounter Diagnoses  Name Primary?  . Urinary frequency Yes  . Allergic rhinitis   . Fibromyalgia      Specific recommendations today include:  Again Bactrim antibiotic twice daily for 5-7 days  We sent urine for culture and we'll call with results in about 3 days  Begin Nasonex nasal spray for allergies, 1 spray per nostril twice daily, let us known how this works  You may also use the Tylenol Sinus if desired  I will pass along information to Dr. Tomi Bamberger about the fibromyalgia study  Return pending culture.

## 2014-02-20 ENCOUNTER — Telehealth: Payer: Self-pay | Admitting: Family Medicine

## 2014-02-20 LAB — URINE CULTURE
COLONY COUNT: NO GROWTH
Organism ID, Bacteria: NO GROWTH

## 2014-02-20 NOTE — Telephone Encounter (Signed)
Patient advised.

## 2014-02-20 NOTE — Telephone Encounter (Signed)
Left message for patient to return my call.

## 2014-02-20 NOTE — Telephone Encounter (Signed)
Fibromyalgia getting worse wants referral to neurologist

## 2014-02-20 NOTE — Telephone Encounter (Signed)
It looks like she has been under the care of a rheumatologist, which is a specialist that treats fibromyalgia.  She should f/u with Dr. Ouida Sills re: her fibro, and if unhappy with his management, then okay to refer to neuro.  I do, however, recommend she consider the pharmquest study

## 2014-03-12 ENCOUNTER — Emergency Department (HOSPITAL_COMMUNITY)
Admission: EM | Admit: 2014-03-12 | Discharge: 2014-03-12 | Payer: 59 | Source: Home / Self Care | Attending: Family Medicine | Admitting: Family Medicine

## 2014-03-12 ENCOUNTER — Emergency Department (HOSPITAL_COMMUNITY)
Admission: EM | Admit: 2014-03-12 | Discharge: 2014-03-12 | Disposition: A | Discharge status: Home / Self Care | Payer: 59 | Attending: Emergency Medicine | Admitting: Emergency Medicine

## 2014-03-12 ENCOUNTER — Encounter (HOSPITAL_COMMUNITY): Payer: Self-pay | Admitting: Emergency Medicine

## 2014-03-12 ENCOUNTER — Telehealth: Payer: Self-pay | Admitting: Family Medicine

## 2014-03-12 ENCOUNTER — Emergency Department (HOSPITAL_COMMUNITY): Payer: 59

## 2014-03-12 DIAGNOSIS — Z85850 Personal history of malignant neoplasm of thyroid: Secondary | ICD-10-CM | POA: Insufficient documentation

## 2014-03-12 DIAGNOSIS — M069 Rheumatoid arthritis, unspecified: Secondary | ICD-10-CM | POA: Insufficient documentation

## 2014-03-12 DIAGNOSIS — Z791 Long term (current) use of non-steroidal anti-inflammatories (NSAID): Secondary | ICD-10-CM | POA: Insufficient documentation

## 2014-03-12 DIAGNOSIS — Z8669 Personal history of other diseases of the nervous system and sense organs: Secondary | ICD-10-CM | POA: Insufficient documentation

## 2014-03-12 DIAGNOSIS — Z8719 Personal history of other diseases of the digestive system: Secondary | ICD-10-CM | POA: Insufficient documentation

## 2014-03-12 DIAGNOSIS — Z79899 Other long term (current) drug therapy: Secondary | ICD-10-CM | POA: Insufficient documentation

## 2014-03-12 DIAGNOSIS — F17200 Nicotine dependence, unspecified, uncomplicated: Secondary | ICD-10-CM | POA: Insufficient documentation

## 2014-03-12 DIAGNOSIS — N390 Urinary tract infection, site not specified: Secondary | ICD-10-CM | POA: Insufficient documentation

## 2014-03-12 DIAGNOSIS — J4489 Other specified chronic obstructive pulmonary disease: Secondary | ICD-10-CM | POA: Insufficient documentation

## 2014-03-12 DIAGNOSIS — M1990 Unspecified osteoarthritis, unspecified site: Secondary | ICD-10-CM | POA: Insufficient documentation

## 2014-03-12 DIAGNOSIS — F3289 Other specified depressive episodes: Secondary | ICD-10-CM | POA: Insufficient documentation

## 2014-03-12 DIAGNOSIS — R109 Unspecified abdominal pain: Secondary | ICD-10-CM

## 2014-03-12 DIAGNOSIS — F329 Major depressive disorder, single episode, unspecified: Secondary | ICD-10-CM | POA: Insufficient documentation

## 2014-03-12 DIAGNOSIS — R52 Pain, unspecified: Secondary | ICD-10-CM

## 2014-03-12 DIAGNOSIS — E039 Hypothyroidism, unspecified: Secondary | ICD-10-CM | POA: Insufficient documentation

## 2014-03-12 DIAGNOSIS — J449 Chronic obstructive pulmonary disease, unspecified: Secondary | ICD-10-CM | POA: Insufficient documentation

## 2014-03-12 DIAGNOSIS — G8929 Other chronic pain: Secondary | ICD-10-CM | POA: Insufficient documentation

## 2014-03-12 DIAGNOSIS — Z8701 Personal history of pneumonia (recurrent): Secondary | ICD-10-CM | POA: Insufficient documentation

## 2014-03-12 LAB — URINALYSIS, ROUTINE W REFLEX MICROSCOPIC
Bilirubin Urine: NEGATIVE
Glucose, UA: NEGATIVE mg/dL
Ketones, ur: NEGATIVE mg/dL
Nitrite: NEGATIVE
Protein, ur: NEGATIVE mg/dL
SPECIFIC GRAVITY, URINE: 1.02 (ref 1.005–1.030)
Urobilinogen, UA: 0.2 mg/dL (ref 0.0–1.0)
pH: 5.5 (ref 5.0–8.0)

## 2014-03-12 LAB — URINE MICROSCOPIC-ADD ON

## 2014-03-12 LAB — POCT URINALYSIS DIP (DEVICE)
Bilirubin Urine: NEGATIVE
Glucose, UA: NEGATIVE mg/dL
KETONES UR: NEGATIVE mg/dL
Nitrite: NEGATIVE
PROTEIN: NEGATIVE mg/dL
SPECIFIC GRAVITY, URINE: 1.02 (ref 1.005–1.030)
Urobilinogen, UA: 0.2 mg/dL (ref 0.0–1.0)
pH: 5.5 (ref 5.0–8.0)

## 2014-03-12 LAB — COMPREHENSIVE METABOLIC PANEL
ALT: 11 U/L (ref 0–35)
AST: 20 U/L (ref 0–37)
Albumin: 3.9 g/dL (ref 3.5–5.2)
Alkaline Phosphatase: 94 U/L (ref 39–117)
BUN: 10 mg/dL (ref 6–23)
CO2: 25 meq/L (ref 19–32)
CREATININE: 0.79 mg/dL (ref 0.50–1.10)
Calcium: 9.4 mg/dL (ref 8.4–10.5)
Chloride: 105 mEq/L (ref 96–112)
Glucose, Bld: 83 mg/dL (ref 70–99)
Potassium: 4 mEq/L (ref 3.7–5.3)
Sodium: 144 mEq/L (ref 137–147)
Total Bilirubin: 0.3 mg/dL (ref 0.3–1.2)
Total Protein: 7.7 g/dL (ref 6.0–8.3)

## 2014-03-12 LAB — CBC WITH DIFFERENTIAL/PLATELET
BASOS ABS: 0 10*3/uL (ref 0.0–0.1)
Basophils Relative: 1 % (ref 0–1)
EOS PCT: 5 % (ref 0–5)
Eosinophils Absolute: 0.3 10*3/uL (ref 0.0–0.7)
HEMATOCRIT: 39.1 % (ref 36.0–46.0)
Hemoglobin: 13.3 g/dL (ref 12.0–15.0)
LYMPHS PCT: 56 % — AB (ref 12–46)
Lymphs Abs: 3.1 10*3/uL (ref 0.7–4.0)
MCH: 27.5 pg (ref 26.0–34.0)
MCHC: 34 g/dL (ref 30.0–36.0)
MCV: 80.8 fL (ref 78.0–100.0)
MONO ABS: 0.3 10*3/uL (ref 0.1–1.0)
Monocytes Relative: 6 % (ref 3–12)
Neutro Abs: 1.7 10*3/uL (ref 1.7–7.7)
Neutrophils Relative %: 32 % — ABNORMAL LOW (ref 43–77)
Platelets: 275 10*3/uL (ref 150–400)
RBC: 4.84 MIL/uL (ref 3.87–5.11)
RDW: 13.5 % (ref 11.5–15.5)
WBC: 5.4 10*3/uL (ref 4.0–10.5)

## 2014-03-12 MED ORDER — DEXTROSE 5 % IV SOLN
1.0000 g | Freq: Once | INTRAVENOUS | Status: AC
  Administered 2014-03-12: 1 g via INTRAVENOUS
  Filled 2014-03-12: qty 10

## 2014-03-12 MED ORDER — KETOROLAC TROMETHAMINE 30 MG/ML IJ SOLN
30.0000 mg | Freq: Once | INTRAMUSCULAR | Status: AC
  Administered 2014-03-12: 30 mg via INTRAVENOUS
  Filled 2014-03-12: qty 1

## 2014-03-12 MED ORDER — ONDANSETRON HCL 4 MG/2ML IJ SOLN
4.0000 mg | Freq: Once | INTRAMUSCULAR | Status: AC
  Administered 2014-03-12: 4 mg via INTRAVENOUS
  Filled 2014-03-12: qty 2

## 2014-03-12 MED ORDER — CEPHALEXIN 500 MG PO CAPS: 500.0000 mg | ORAL_CAPSULE | Freq: Four times a day (QID) | ORAL | Status: DC

## 2014-03-12 MED ORDER — SODIUM CHLORIDE 0.9 % IV BOLUS (SEPSIS)
1000.0000 mL | Freq: Once | INTRAVENOUS | Status: AC
  Administered 2014-03-12: 1000 mL via INTRAVENOUS

## 2014-03-12 NOTE — Telephone Encounter (Signed)
Pt states she is having groin pains and wants to know if if was part of her fibromyalgia.  She has taken all pain meds.  I advised pt she would need to be seen, but she would like a call from you to tell her if this was part of fibromyalgia.  Pt ph [phone]

## 2014-03-12 NOTE — ED Provider Notes (Signed)
CSN: [id number]     Arrival date & time 03/12/14  1549 History   First MD Initiated Contact with Patient 03/12/14 1831     Chief Complaint  Patient presents with  . Groin Pain  . Flank Pain     (Consider location/radiation/quality/duration/timing/severity/associated sxs/prior Treatment) HPI Comments: Patient presents with left-sided flank and low-back abdominal pain onset yesterday evening. Pain is been constant. It radiates to her left groin. She denies any nausea or vomiting. She endorses frequent episodes of urination her pain was. No blood in her urine. No vaginal bleeding or discharge. No chest pain or shortness of breath. No history of kidney stones. Sent from urgent care for further evaluation. She has a history of fibromyalgia and feels this pain is different than what she is used to. She took Tramadol at home with partial relief.  Patient is a 58 y.o. female presenting with flank pain. The history is provided by the patient.  Flank Pain Associated symptoms include abdominal pain. Pertinent negatives include no chest pain and no headaches.    Past Medical History  Diagnosis Date  . COPD (chronic obstructive pulmonary disease)   . GERD (gastroesophageal reflux disease)   . Sinus bradycardia   . Hypothyroidism   . Hematuria     microscopic hematuria (urol & nephrol w/u neg), multiple UTI`s  . Fibromyalgia   . Carpal tunnel syndrome     right wrist  . Hyperlipidemia     managed by Dr. Ouida Sills  . Microscopic hematuria     glomerular from sickle cell trait (per Dr. Jeffie Pollock 02/2013)  . Pneumonia     "abuntantly as a child"  . Shortness of breath     "at rest" (12/05/2013)  . Migraine     "q 3-4 months" (12/05/2013)  . Chronic back pain   . Osteoarthritis     Rheumatoid and OA.  sees Dr. Ouida Sills [facility] medical, rheumatology)  . Rheumatoid arthritis   . Depression 2006    "after my son passed" (12/05/2013)  . Thyroid cancer     S/P radiation & OR   Past Surgical History   Procedure Laterality Date  . Thyroidectomy, partial  11/2005; 11/2006  . Cystoscopy  02/26/2013    Dr. Jeffie Pollock (normal; follicular cystitis)  . Tubal ligation  1980's  . Total abdominal hysterectomy  ~ 1990   Family History  Problem Relation Age of Onset  . Stroke Mother   . Coronary artery disease Mother   . Kidney disease Mother     on dialysis  . Cancer Father     bone cancer  . Arthritis Sister     rheumatoid and osteoarthritis  . Arthritis Sister     RA and OA   History  Substance Use Topics  . Smoking status: Current Every Day Smoker -- 0.50 packs/day for 34 years    Types: Cigarettes  . Smokeless tobacco: Never Used  . Alcohol Use: Yes     Comment: 12/05/2013 "glass of wine 1-2 times/yr"   OB History   Grav Para Term Preterm Abortions TAB SAB Ect Mult Living                 Review of Systems  Constitutional: Negative for fever, activity change and appetite change.  Respiratory: Negative for cough and chest tightness.   Cardiovascular: Negative for chest pain.  Gastrointestinal: Positive for abdominal pain. Negative for vomiting.  Genitourinary: Positive for dysuria and flank pain. Negative for hematuria, decreased urine volume, vaginal bleeding and  vaginal discharge.  Musculoskeletal: Positive for arthralgias, back pain and myalgias.  Skin: Negative for rash.  Neurological: Negative for dizziness, weakness and headaches.  A complete 10 system review of systems was obtained and all systems are negative except as noted in the HPI and PMH.      Allergies  Review of patient's allergies indicates no known allergies.  Home Medications   Prior to Admission medications   Medication Sig Start Date End Date Taking? Authorizing Provider  Acetaminophen (TYLENOL ARTHRITIS EXT RELIEF PO) Take 1 tablet by mouth every 4 (four) hours as needed (pain).   Yes Historical Provider, MD  Ascorbic Acid (VITAMIN C) 500 MG tablet Take 500 mg by mouth daily.    Yes Historical Provider,  MD  cholecalciferol (VITAMIN D) 1000 UNITS tablet Take 1,000 Units by mouth daily.   Yes Historical Provider, MD  diclofenac sodium (VOLTAREN) 1 % GEL Apply 1 application topically 4 (four) times daily.   Yes Historical Provider, MD  LYRICA 150 MG capsule Take 150 mg by mouth 2 (two) times daily.  12/06/13  Yes Historical Provider, MD  mometasone (NASONEX) 50 MCG/ACT nasal spray Place 2 sprays into the nose daily as needed (allergies).   Yes Historical Provider, MD  Multiple Vitamin (MULTIVITAMIN) tablet Take 1 tablet by mouth daily.   Yes Historical Provider, MD  naproxen (NAPROSYN) 500 MG tablet Take 500 mg by mouth every 12 (twelve) hours.   Yes Historical Provider, MD  SYNTHROID 137 MCG tablet Take 1 tablet (137 mcg total) by mouth daily before breakfast. 01/31/14  Yes Rita Ohara, MD  traMADol (ULTRAM) 50 MG tablet Take 50 mg by mouth 4 (four) times daily.    Yes Historical Provider, MD   BP 110/61  Pulse 49  Temp(Src) 97.6 F (36.4 C) (Oral)  Resp 17  Ht 5\' 7"  (1.702 m)  Wt 138 lb (62.596 kg)  BMI 21.61 kg/m2  SpO2 95% Physical Exam  Constitutional: She is oriented to person, place, and time. She appears well-developed and well-nourished. No distress.  HENT:  Head: Normocephalic and atraumatic.  Mouth/Throat: Oropharynx is clear and moist. No oropharyngeal exudate.  Eyes: Conjunctivae and EOM are normal. Pupils are equal, round, and reactive to light.  Neck: Normal range of motion. Neck supple.  Cardiovascular: Normal rate, regular rhythm and normal heart sounds.   No murmur heard. Pulmonary/Chest: Effort normal and breath sounds normal. No respiratory distress.  Abdominal: Soft. There is tenderness. There is no rebound and no guarding.  Tenderness palpation of the left side of the abdomen. No peritoneal signs.  Musculoskeletal: Normal range of motion. She exhibits no edema and no tenderness.  Left CVA tenderness FROM L hip without pain.  Neurological: She is alert and oriented to  person, place, and time. No cranial nerve deficit. She exhibits normal muscle tone. Coordination normal.  Skin: Skin is warm.    ED Course  Procedures (including critical care time) Labs Review Labs Reviewed  CBC WITH DIFFERENTIAL - Abnormal; Notable for the following:    Neutrophils Relative % 32 (*)    Lymphocytes Relative 56 (*)    All other components within normal limits  URINALYSIS, ROUTINE W REFLEX MICROSCOPIC - Abnormal; Notable for the following:    APPearance CLOUDY (*)    Hgb urine dipstick MODERATE (*)    Leukocytes, UA MODERATE (*)    All other components within normal limits  URINE MICROSCOPIC-ADD ON - Abnormal; Notable for the following:    Squamous Epithelial / LPF  FEW (*)    Bacteria, UA MANY (*)    All other components within normal limits  URINE CULTURE  COMPREHENSIVE METABOLIC PANEL    Imaging Review Ct Abdomen Pelvis Wo Contrast  03/12/2014   CLINICAL DATA:  Sharp left groin pain.  EXAM: CT ABDOMEN AND PELVIS WITHOUT CONTRAST  TECHNIQUE: Multidetector CT imaging of the abdomen and pelvis was performed following the standard protocol without intravenous contrast.  COMPARISON:  DG HIP COMPLETE 2+V*R* dated 03/07/2014; DG LUMBAR SPINE COMPLETE dated 12/13/2011  FINDINGS: Liver normal. Spleen normal. Pancreas normal. Gallbladder nondistended. No biliary distention is present.  Adrenals normal. No focal renal abnormality. No hydronephrosis or evidence of obstructing ureteral stone. Bladder is nondistended. Multiple phleboliths. Hysterectomy. No free pelvic fluid.  Shotty inguinal and retroperitoneal lymph nodes are noted. No prominent lymph nodes noted.  No inflammatory changes are noted in the right lower quadrant. Appendix is not definitely identified. Stool is noted throughout the colon. There is no bowel distention. No evidence of bowel obstruction. No free air. No mesenteric mass.  Mild basilar atelectasis. Heart size normal. No acute bony abnormality. Sclerotic density  is noted in the right sacrum. This is most likely a bone island. No other sclerotic bony densities noted to suggest metastatic disease.  IMPRESSION: No acute abnormality.   Electronically Signed   By: Marcello Moores  Register   On: 03/12/2014 20:36     EKG Interpretation   Date/Time:  Tuesday March 12 2014 21:12:31 EDT Ventricular Rate:  45 PR Interval:  193 QRS Duration: 67 QT Interval:  481 QTC Calculation: 416 R Axis:   66 Text Interpretation:  Sinus bradycardia Probable left atrial enlargement  Borderline ST elevation, inferior leads No significant change was found  Confirmed by Wyvonnia Dusky  MD, Pantelis Elgersma ([id number]) on 03/12/2014 9:28:59 PM      MDM   Final diagnoses:  Urinary tract infection   Flank pain and left low back pain since yesterday. No fever or vomiting. Nontoxic appearing.  UA shows bacteria with hemoglobin and leukocyte esterase. We'll treat for UTI. Rule out kidney stone.  Sinus bradycardia at baseline. No CP, SOB, palpitations or dizziness. CT unremarkable.  Will treat for UTI.  Tolerating pO in the ED, pain controlled. BP 110/61  Pulse 49  Temp(Src) 97.6 F (36.4 C) (Oral)  Resp 17  Ht 5\' 7"  (1.702 m)  Wt 138 lb (62.596 kg)  BMI 21.61 kg/m2  SpO2 95%   Ezequiel Essex, MD 03/13/14 [phone]

## 2014-03-12 NOTE — ED Notes (Addendum)
Pt describes sharp pain to left groin with radiation to left flank. Pt states no urinary changes. Sent from [facility] for evaluation. No deformities slight tenderness.

## 2014-03-12 NOTE — ED Notes (Signed)
Visitor asked if patient can have subway sandwich once returned from CT.  Informed that she can purchase sandwich now, and leave at the bedside for when patient returns, but needs results reviewed by MD first before consuming.

## 2014-03-12 NOTE — ED Notes (Signed)
C/o sharp, left groin pain and left low back pain that started in the past 24 hours.  Feels like a "quick jab, sharp " pain

## 2014-03-12 NOTE — ED Notes (Signed)
Reports left groin pain that started yesterday, radiates around to her back. Denies urinary symptoms, +hematuria at ucc and sent here for further eval.

## 2014-03-12 NOTE — ED Provider Notes (Signed)
CSN: [id number]     Arrival date & time 03/12/14  1135 History   First MD Initiated Contact with Patient 03/12/14 1252     Chief Complaint  Patient presents with  . Abdominal Pain  . Back Pain   (Consider location/radiation/quality/duration/timing/severity/associated sxs/prior Treatment) Patient is a 58 y.o. female presenting with flank pain. The history is provided by the patient.  Flank Pain This is a new problem. The current episode started yesterday. The problem has not changed since onset.Pertinent negatives include no chest pain and no abdominal pain. Associated symptoms comments: Shooting pain tro left groin. . Nothing aggravates the symptoms.    Past Medical History  Diagnosis Date  . COPD (chronic obstructive pulmonary disease)   . GERD (gastroesophageal reflux disease)   . Sinus bradycardia   . Hypothyroidism   . Hematuria     microscopic hematuria (urol & nephrol w/u neg), multiple UTI`s  . Fibromyalgia   . Carpal tunnel syndrome     right wrist  . Hyperlipidemia     managed by Dr. Ouida Sills  . Microscopic hematuria     glomerular from sickle cell trait (per Dr. Jeffie Pollock 02/2013)  . Pneumonia     "abuntantly as a child"  . Shortness of breath     "at rest" (12/05/2013)  . Migraine     "q 3-4 months" (12/05/2013)  . Chronic back pain   . Osteoarthritis     Rheumatoid and OA.  sees Dr. Ouida Sills [facility] medical, rheumatology)  . Rheumatoid arthritis   . Depression 2006    "after my son passed" (12/05/2013)  . Thyroid cancer     S/P radiation & OR   Past Surgical History  Procedure Laterality Date  . Thyroidectomy, partial  11/2005; 11/2006  . Cystoscopy  02/26/2013    Dr. Jeffie Pollock (normal; follicular cystitis)  . Tubal ligation  1980's  . Total abdominal hysterectomy  ~ 1990   Family History  Problem Relation Age of Onset  . Stroke Mother   . Coronary artery disease Mother   . Kidney disease Mother     on dialysis  . Cancer Father     bone cancer  . Arthritis  Sister     rheumatoid and osteoarthritis  . Arthritis Sister     RA and OA   History  Substance Use Topics  . Smoking status: Current Every Day Smoker -- 0.50 packs/day for 34 years    Types: Cigarettes  . Smokeless tobacco: Never Used  . Alcohol Use: Yes     Comment: 12/05/2013 "glass of wine 1-2 times/yr"   OB History   Grav Para Term Preterm Abortions TAB SAB Ect Mult Living                 Review of Systems  Constitutional: Negative.   Cardiovascular: Negative for chest pain.  Gastrointestinal: Negative.  Negative for nausea, vomiting, abdominal pain, diarrhea and constipation.  Genitourinary: Positive for flank pain. Negative for dysuria, frequency, hematuria and pelvic pain.  Musculoskeletal: Negative.     Allergies  Review of patient's allergies indicates no known allergies.  Home Medications   Prior to Admission medications   Medication Sig Start Date End Date Taking? Authorizing Provider  Ascorbic Acid (VITAMIN C) 500 MG tablet Take 500 mg by mouth daily.     Historical Provider, MD  cholecalciferol (VITAMIN D) 1000 UNITS tablet Take 1,000 Units by mouth daily.    Historical Provider, MD  diclofenac sodium (VOLTAREN) 1 % GEL Apply 1  application topically 4 (four) times daily.    Historical Provider, MD  LYRICA 150 MG capsule Take 150 mg by mouth 2 (two) times daily.  12/06/13   Historical Provider, MD  mometasone (NASONEX) 50 MCG/ACT nasal spray Place 2 sprays into the nose daily. 02/19/14   Camelia Eng Tysinger, PA-C  Multiple Vitamin (MULTIVITAMIN) tablet Take 1 tablet by mouth daily.    Historical Provider, MD  naproxen (NAPROSYN) 500 MG tablet Take 500 mg by mouth every 12 (twelve) hours.    Historical Provider, MD  sulfamethoxazole-trimethoprim (BACTRIM) 400-80 MG per tablet Take 1 tablet by mouth 2 (two) times daily. 02/19/14   Camelia Eng Tysinger, PA-C  SYNTHROID 137 MCG tablet Take 1 tablet (137 mcg total) by mouth daily before breakfast. 01/31/14   Rita Ohara, MD    traMADol (ULTRAM) 50 MG tablet Take 50 mg by mouth every 6 (six) hours as needed for pain.    Historical Provider, MD   BP 146/82  Pulse 46  Temp(Src) 97 F (36.1 C) (Oral)  Resp 20  SpO2 97% Physical Exam  Nursing note and vitals reviewed. Constitutional: She is oriented to person, place, and time. She appears well-developed and well-nourished.  Abdominal: Soft. Bowel sounds are normal. She exhibits no distension and no mass. There is no tenderness. There is no rebound and no guarding.  Musculoskeletal:       Lumbar back: She exhibits tenderness.       Back:  Neurological: She is alert and oriented to person, place, and time.  Skin: Skin is warm and dry.    ED Course  Procedures (including critical care time) Labs Review Labs Reviewed  POCT URINALYSIS DIP (DEVICE) - Abnormal; Notable for the following:    Hgb urine dipstick MODERATE (*)    Leukocytes, UA SMALL (*)    All other components within normal limits    Imaging Review No results found. U/a pos for hgb.  MDM   1. Acute left flank pain    Chronic microhematuria, sent for eval of acute left flank pain    Billy Fischer, MD 03/12/14 [phone]

## 2014-03-12 NOTE — ED Notes (Signed)
Patient in bathroom

## 2014-03-12 NOTE — Telephone Encounter (Signed)
Advise pt that there are many causes of groin pain--it can be from hip arthritis, tendonitis (hip flexors), due to swollen lymph nodes in the groin.  Fibromyalgia does include widespread pain, so that could be a possibility, but I can't really tell her why she is hurting now without examining her.  Please pass on this information, and offer appointment tomorrow for eval if her pain is worsening

## 2014-03-12 NOTE — ED Notes (Signed)
Patient transported to CT 

## 2014-03-12 NOTE — Discharge Instructions (Signed)
Urinary Tract Infection  Urinary tract infections (UTIs) can develop anywhere along your urinary tract. Your urinary tract is your body's drainage system for removing wastes and extra water. Your urinary tract includes two kidneys, two ureters, a bladder, and a urethra. Your kidneys are a pair of bean-shaped organs. Each kidney is about the size of your fist. They are located below your ribs, one on each side of your spine.  CAUSES  Infections are caused by microbes, which are microscopic organisms, including fungi, viruses, and bacteria. These organisms are so small that they can only be seen through a microscope. Bacteria are the microbes that most commonly cause UTIs.  SYMPTOMS   Symptoms of UTIs may vary by age and gender of the patient and by the location of the infection. Symptoms in young women typically include a frequent and intense urge to urinate and a painful, burning feeling in the bladder or urethra during urination. Older women and men are more likely to be tired, shaky, and weak and have muscle aches and abdominal pain. A fever may mean the infection is in your kidneys. Other symptoms of a kidney infection include pain in your back or sides below the ribs, nausea, and vomiting.  DIAGNOSIS  To diagnose a UTI, your caregiver will ask you about your symptoms. Your caregiver also will ask to provide a urine sample. The urine sample will be tested for bacteria and white blood cells. White blood cells are made by your body to help fight infection.  TREATMENT   Typically, UTIs can be treated with medication. Because most UTIs are caused by a bacterial infection, they usually can be treated with the use of antibiotics. The choice of antibiotic and length of treatment depend on your symptoms and the type of bacteria causing your infection.  HOME CARE INSTRUCTIONS   If you were prescribed antibiotics, take them exactly as your caregiver instructs you. Finish the medication even if you feel better after you  have only taken some of the medication.   Drink enough water and fluids to keep your urine clear or pale yellow.   Avoid caffeine, tea, and carbonated beverages. They tend to irritate your bladder.   Empty your bladder often. Avoid holding urine for long periods of time.   Empty your bladder before and after sexual intercourse.   After a bowel movement, women should cleanse from front to back. Use each tissue only once.  SEEK MEDICAL CARE IF:    You have back pain.   You develop a fever.   Your symptoms do not begin to resolve within 3 days.  SEEK IMMEDIATE MEDICAL CARE IF:    You have severe back pain or lower abdominal pain.   You develop chills.   You have nausea or vomiting.   You have continued burning or discomfort with urination.  MAKE SURE YOU:    Understand these instructions.   Will watch your condition.   Will get help right away if you are not doing well or get worse.  Document Released: 08/11/2005 Document Revised: 05/02/2012 Document Reviewed: 12/10/2011  [employer] Patient Information 2014 [employer].

## 2014-03-12 NOTE — Telephone Encounter (Signed)
Just called pt and pt is at urgent care right now cause she couldn't take the pain. But i did pass on info to her about what you said

## 2014-03-13 LAB — URINE CULTURE
Colony Count: NO GROWTH
Culture: NO GROWTH

## 2014-03-18 ENCOUNTER — Ambulatory Visit (INDEPENDENT_AMBULATORY_CARE_PROVIDER_SITE_OTHER): Payer: Managed Care, Other (non HMO) | Admitting: Family Medicine

## 2014-03-18 ENCOUNTER — Encounter: Payer: Self-pay | Admitting: Family Medicine

## 2014-03-18 VITALS — BP 110/64 | HR 56 | Temp 98.0°F | Ht 66.0 in | Wt 141.0 lb

## 2014-03-18 DIAGNOSIS — R319 Hematuria, unspecified: Secondary | ICD-10-CM

## 2014-03-18 DIAGNOSIS — M797 Fibromyalgia: Secondary | ICD-10-CM

## 2014-03-18 DIAGNOSIS — IMO0001 Reserved for inherently not codable concepts without codable children: Secondary | ICD-10-CM

## 2014-03-18 DIAGNOSIS — R109 Unspecified abdominal pain: Secondary | ICD-10-CM

## 2014-03-18 LAB — POCT URINALYSIS DIPSTICK
Bilirubin, UA: NEGATIVE
Glucose, UA: NEGATIVE
Ketones, UA: NEGATIVE
Nitrite, UA: NEGATIVE
PROTEIN UA: NEGATIVE
Spec Grav, UA: 1.02
UROBILINOGEN UA: NEGATIVE
pH, UA: 5

## 2014-03-18 NOTE — Patient Instructions (Signed)
Stop the Keflex--urine culture was negative.  I think your pain is very superficial (rather than deep and related to kidney are internal organs).  Continue your treatment for fibromyalgia.  Return if you develop fever, urinary symptoms, or other new complaints.

## 2014-03-18 NOTE — Progress Notes (Signed)
Chief Complaint  Patient presents with  . Follow-up    ER follow up-UTI. Still having some abdominal pain-is cuurently taking keflex QID.    Patient was seen 4/28 in UC (and then sent to ER for further eval) for evaluation of left sided flank and abdominal pain.  She was diagnosed with UTI and treated with Rocephin and Keflex. CT was negative.  Urine culture shows no growth.  She had also been treated for UTI earlier in the month (4/7) by Audelia Acton, with Septra (culture was also negative).  She had NOT been on any recent antibiotics prior to the culture done in the ER (had completed the full week of Septra, which patient reports was also for her sinuses).    Her symptoms are significantly improved, but not completely gone.  She never had dysuria, urgency/frequency, and still does not complain of these symptoms.  She has chronic pain, which is controlled by naproxen and ultram (which she takes 4/day regularly).  She reports her bowels are normal, moves them every other day, which is normal for her.  Smoothemove tea helps keep her bowels regulated.  She has appt with neurologist tomorrow for her fibromyalgia, and also has contacted [facility] re: the study.  Past Medical History  Diagnosis Date  . COPD (chronic obstructive pulmonary disease)   . GERD (gastroesophageal reflux disease)   . Sinus bradycardia   . Hypothyroidism   . Hematuria     microscopic hematuria (urol & nephrol w/u neg), multiple UTI`s  . Fibromyalgia   . Carpal tunnel syndrome     right wrist  . Hyperlipidemia     managed by Dr. Ouida Sills  . Microscopic hematuria     glomerular from sickle cell trait (per Dr. Jeffie Pollock 02/2013)  . Pneumonia     "abuntantly as a child"  . Shortness of breath     "at rest" (12/05/2013)  . Migraine     "q 3-4 months" (12/05/2013)  . Chronic back pain   . Osteoarthritis     Rheumatoid and OA.  sees Dr. Ouida Sills [facility], rheumatology)  . Rheumatoid arthritis   . Depression 2006    "after  my son passed" (12/05/2013)  . Thyroid cancer     S/P radiation & OR   Past Surgical History  Procedure Laterality Date  . Thyroidectomy, partial  11/2005; 11/2006  . Cystoscopy  02/26/2013    Dr. Jeffie Pollock (normal; follicular cystitis)  . Tubal ligation  1980's  . Total abdominal hysterectomy  ~ 1990   History   Social History  . Marital Status: Divorced    Spouse Name: N/A    Number of Children: 2  . 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 Smoking status: Current Every Day Smoker -- 0.50 packs/day for 34 years    Types: Cigarettes  . Smokeless tobacco: Never Used  . Alcohol Use: Yes     Comment: 12/05/2013 "glass of wine 1-2 times/yr"  . Drug Use: No  . Sexual Activity: Not Currently    Partners: Male   Other Topics Concern  . Not on file   Social History Narrative   Lives alone.  2 living children (1 deceased), 1 in [place], 1 in [place], 6 grandchildren   Outpatient Encounter Prescriptions as of 03/18/2014  Medication Sig  . Acetaminophen (TYLENOL ARTHRITIS EXT RELIEF PO) Take 1 tablet by mouth every 4 (four) hours as needed (pain).  Marland Kitchen  Ascorbic Acid (VITAMIN C) 500 MG tablet Take 500 mg by mouth daily.   . cholecalciferol (VITAMIN D) 1000 UNITS tablet Take 1,000 Units by mouth daily.  . diclofenac sodium (VOLTAREN) 1 % GEL Apply 1 application topically 4 (four) times daily.  Marland Kitchen LYRICA 150 MG capsule Take 150 mg by mouth 2 (two) times daily.   . mometasone (NASONEX) 50 MCG/ACT nasal spray Place 2 sprays into the nose daily as needed (allergies).  . Multiple Vitamin (MULTIVITAMIN) tablet Take 1 tablet by mouth daily.  . naproxen (NAPROSYN) 500 MG tablet Take 500 mg by mouth every 12 (twelve) hours.  Marland Kitchen SYNTHROID 137 MCG tablet Take 1 tablet (137 mcg total) by mouth daily before breakfast.  . traMADol (ULTRAM) 50 MG tablet Take 50 mg by mouth 4 (four) times daily.   . [DISCONTINUED] cephALEXin  (KEFLEX) 500 MG capsule Take 1 capsule (500 mg total) by mouth 4 (four) times daily.   No Known Allergies  ROS: Denies fevers, chills, nausea, vomiting.  No blood in stool, no vaginal discharge, odor or itch. No chest pain or shortness of breath.  She had a short spell of palpitations on Saturday night.  No bleeding, bruising or rashes. No dysuria, no frank hematuria.   PHYSICAL EXAM: BP 110/64  Pulse 56  Temp(Src) 98 F (36.7 C) (Oral)  Ht 5\' 6"  (1.676 m)  Wt 141 lb (63.957 kg)  BMI 22.77 kg/m2  Well developed, pleasant female in no distress. She is exquisitely sensitive to very light touch over the left flank--tender to just the stethoscope touching her skin.  Skin is intact without rash.  She asked to NOT have the area percussed for CVA tenderness given the superficial pain.  She was also tender diffusely over the spine, with multiple trigger points Heart: regular rate and rhythm without murmur Lungs: clear bilaterally Abdomen: active bowel sounds.  She is tender in the L abdomen--again, very tender with just superficial palpation.  No rebound tenderness or guarding.  No mass appreciable. Extremities: no edema Neuro: alert and oriented.  Normal strength, sensation, gait  Urine dip from today:  2+ blood (chronic, per pt), trace leuks  Ct Abdomen Pelvis Wo Contrast  03/12/2014 CLINICAL DATA: Sharp left groin pain. EXAM: CT ABDOMEN AND PELVIS WITHOUT CONTRAST TECHNIQUE: Multidetector CT imaging of the abdomen and pelvis was performed following the standard protocol without intravenous contrast. COMPARISON: DG HIP COMPLETE 2+V*R* dated 03/07/2014; DG LUMBAR SPINE COMPLETE dated 12/13/2011 FINDINGS: Liver normal. Spleen normal. Pancreas normal. Gallbladder nondistended. No biliary distention is present. Adrenals normal. No focal renal abnormality. No hydronephrosis or evidence of obstructing ureteral stone. Bladder is nondistended. Multiple phleboliths. Hysterectomy. No free pelvic fluid. Shotty  inguinal and retroperitoneal lymph nodes are noted. No prominent lymph nodes noted. No inflammatory changes are noted in the right lower quadrant. Appendix is not definitely identified. Stool is noted throughout the colon. There is no bowel distention. No evidence of bowel obstruction. No free air. No mesenteric mass. Mild basilar atelectasis. Heart size normal. No acute bony abnormality. Sclerotic density is noted in the right sacrum. This is most likely a bone island. No other sclerotic bony densities noted to suggest metastatic disease. IMPRESSION: No acute abnormality. Electronically Signed By: Marcello Moores Register On: 03/12/2014 20:36    Lab Results  Component Value Date   WBC 5.4 03/12/2014   HGB 13.3 03/12/2014   HCT 39.1 03/12/2014   MCV 80.8 03/12/2014   PLT 275 03/12/2014     Chemistry  Component Value Date/Time   NA 144 03/12/2014 1606   K 4.0 03/12/2014 1606   CL 105 03/12/2014 1606   CO2 25 03/12/2014 1606   BUN 10 03/12/2014 1606   CREATININE 0.79 03/12/2014 1606   CREATININE 1.17* 01/24/2013 1150      Component Value Date/Time   CALCIUM 9.4 03/12/2014 1606   ALKPHOS 94 03/12/2014 1606   AST 20 03/12/2014 1606   ALT 11 03/12/2014 1606   BILITOT 0.3 03/12/2014 1606     Glucose 83   ASSESSMENT/PLAN:   Abdominal pain, unspecified site - no e/o UTI, normal CT.  suspect fibromyalgia +/- constipation - Plan: POCT Urinalysis Dipstick  Fibromyalgia  Left flank pain - normal CT; tender very superificially, suspect related to fibromyalgia.  continue current treatment. d/c keflex  Continue current treatments for paiin, and f/u as scheduled for further eval of fibromyalgia. Stop the Keflex Hematuria is chronic; no stone on CT and no UTI

## 2014-03-19 ENCOUNTER — Ambulatory Visit (INDEPENDENT_AMBULATORY_CARE_PROVIDER_SITE_OTHER): Payer: Managed Care, Other (non HMO) | Admitting: Neurology

## 2014-03-19 ENCOUNTER — Encounter: Payer: Self-pay | Admitting: Neurology

## 2014-03-19 VITALS — BP 132/81 | HR 56 | Ht 67.0 in | Wt 140.0 lb

## 2014-03-19 DIAGNOSIS — IMO0001 Reserved for inherently not codable concepts without codable children: Secondary | ICD-10-CM

## 2014-03-19 DIAGNOSIS — M797 Fibromyalgia: Secondary | ICD-10-CM

## 2014-03-19 DIAGNOSIS — R269 Unspecified abnormalities of gait and mobility: Secondary | ICD-10-CM

## 2014-03-19 HISTORY — DX: Unspecified abnormalities of gait and mobility: R26.9

## 2014-03-19 NOTE — Patient Instructions (Signed)

## 2014-03-19 NOTE — Progress Notes (Signed)
Reason for visit: Gait disorder  Emma Wilson is a 58 y.o. female  History of present illness:  Ms. Kiely is a 58 year old right-handed white female with a history of a gait disorder that she indicates began in August of 2014. At that time, the patient fell while in a grocery store when her right leg collapsed. The patient indicates that when she does fall, it is usually secondary to a collapse at the knee of the right leg. The last fall was 2 weeks ago. She indicates that there is some tingly sensations in the feet and ankles that will come and go, and this is not present at all times. She indicates that she does not have any problems controlling the bowels or the bladder. She does have some back pain and some neck discomfort, and she indicates that she has a history of rheumatoid arthritis. She denies any pain radiating down the leg from the back or down arms from the neck. She does have some right thigh discomfort at times, and last week, and she indicated she had transient left groin discomfort. She has been walking with a cane since August of 2014. There is occasional episodes of dizziness. The patient has a history of bradycardia. She has a history of fibromyalgia with diffuse neuromuscular discomfort. She is sent to this office for further evaluation.  Past Medical History  Diagnosis Date  . COPD (chronic obstructive pulmonary disease)   . GERD (gastroesophageal reflux disease)   . Sinus bradycardia   . Hypothyroidism   . Hematuria     microscopic hematuria (urol & nephrol w/u neg), multiple UTI`s  . Fibromyalgia   . Carpal tunnel syndrome     right wrist  . Hyperlipidemia     managed by Dr. Ouida Sills  . Microscopic hematuria     glomerular from sickle cell trait (per Dr. Jeffie Pollock 02/2013)  . Pneumonia     "abuntantly as a child"  . Shortness of breath     "at rest" (12/05/2013)  . Migraine     "q 3-4 months" (12/05/2013)  . Chronic back pain   . Osteoarthritis    Rheumatoid and OA.  sees Dr. Ouida Sills [facility] medical, rheumatology)  . Rheumatoid arthritis   . Depression 2006    "after my son passed" (12/05/2013)  . Thyroid cancer     S/P radiation & OR  . Abnormality of gait 03/19/2014    Past Surgical History  Procedure Laterality Date  . Thyroidectomy, partial  11/2005; 11/2006  . Cystoscopy  02/26/2013    Dr. Jeffie Pollock (normal; follicular cystitis)  . Tubal ligation  1980's  . Total abdominal hysterectomy  ~ 1990    Family History  Problem Relation Age of Onset  . Stroke Mother   . Coronary artery disease Mother   . Kidney disease Mother     on dialysis  . Cancer Father     bone cancer  . Arthritis Sister     rheumatoid and osteoarthritis  . Arthritis Sister     RA and OA  . Diabetes Son     Social history:  reports that she has been smoking Cigarettes.  She has a 17 pack-year smoking history. She has never used smokeless tobacco. She reports that she drinks alcohol. She reports that she does not use illicit drugs.  Medications:  Current Outpatient Prescriptions on File Prior to Visit  Medication Sig Dispense Refill  . Acetaminophen (TYLENOL ARTHRITIS EXT RELIEF PO) Take 1 tablet by mouth  every 4 (four) hours as needed (pain).      . Ascorbic Acid (VITAMIN C) 500 MG tablet Take 500 mg by mouth daily.       . cholecalciferol (VITAMIN D) 1000 UNITS tablet Take 1,000 Units by mouth daily.      . diclofenac sodium (VOLTAREN) 1 % GEL Apply 1 application topically 4 (four) times daily.      Marland Kitchen LYRICA 150 MG capsule Take 150 mg by mouth 2 (two) times daily.       . mometasone (NASONEX) 50 MCG/ACT nasal spray Place 2 sprays into the nose daily as needed (allergies).      . Multiple Vitamin (MULTIVITAMIN) tablet Take 1 tablet by mouth daily.      . naproxen (NAPROSYN) 500 MG tablet Take 500 mg by mouth every 12 (twelve) hours.      Marland Kitchen SYNTHROID 137 MCG tablet Take 1 tablet (137 mcg total) by mouth daily before breakfast.  30 tablet  1  . traMADol  (ULTRAM) 50 MG tablet Take 50 mg by mouth 4 (four) times daily.        No current facility-administered medications on file prior to visit.     No Known Allergies  ROS:  Out of a complete 14 system review of symptoms, the patient complains only of the following symptoms, and all other reviewed systems are negative.  Fatigue Ringing in the ears Blurred vision Blood in the urine Joint pain, allergies Weakness Shift work  Blood pressure 132/81, pulse 56, height 5\' 7"  (1.702 m), weight 140 lb (63.504 kg).  Physical Exam  General: The patient is alert and cooperative at the time of the examination.  Eyes: Pupils are equal, round, and reactive to light. Discs are flat bilaterally.  Neck: The neck is supple, no carotid bruits are noted.  Respiratory: The respiratory examination is clear.  Cardiovascular: The cardiovascular examination reveals a regular rate and rhythm, no obvious murmurs or rubs are noted.  Neuromuscular: The patient has excellent range of motion of the lumbosacral spine and cervical spine.  Skin: Extremities are without significant edema.  Neurologic Exam  Mental status: The patient is alert and oriented x 3 at the time of the examination. The patient has apparent normal recent and remote memory, with an apparently normal attention span and concentration ability.  Cranial nerves: Facial symmetry is present. There is good sensation of the face to pinprick and soft touch bilaterally. The strength of the facial muscles and the muscles to head turning and shoulder shrug are normal bilaterally. Speech is well enunciated, no aphasia or dysarthria is noted. Extraocular movements are full. Visual fields are full. The tongue is midline, and the patient has symmetric elevation of the soft palate. No obvious hearing deficits are noted.  Motor: The motor testing reveals 5 over 5 strength of all 4 extremities. Good symmetric motor tone is noted throughout. The patient is able  to walk on heels and the toes.  Sensory: Sensory testing is intact to pinprick, soft touch, vibration sensation, and position sense on all 4 extremities with the exception that there is a slight decrease in position sense in both feet. No evidence of extinction is noted.  Coordination: Cerebellar testing reveals good finger-nose-finger and heel-to-shin bilaterally.  Gait and station: Gait is associated with a limping quality on the right leg. Tandem gait is slightly unsteady. Romberg is negative. No drift is seen.  Reflexes: Deep tendon reflexes are symmetric and normal bilaterally, with exception that the ankle jerk  reflexes are depressed to absent bilaterally.. Toes are downgoing bilaterally.   Assessment/Plan:  1. Fibromyalgia  2. Gait disorder  The patient has a limping gait on the right leg, but she denies pain with walking. The ankle jerk reflexes are depressed bilaterally, and she reports subjective tingling in the feet. She will be set up for blood work looking for metabolic etiologies of gait problems. Nerve conduction studies will be set up for both legs and one arm, and EMG evaluation will be done on the right leg. The patient will followup for the EMG. If the studies are unremarkable, MRI of the brain and cervical spinal cord will be done. The patient indicates that she has already gone through physical therapy for her walking, with modest benefit.  Jill Alexanders MD 03/19/2014 9:05 PM  [facility] [address] [place], [place] [address]  Phone [phone] Fax [phone]

## 2014-03-20 ENCOUNTER — Encounter (INDEPENDENT_AMBULATORY_CARE_PROVIDER_SITE_OTHER): Payer: Self-pay

## 2014-03-20 ENCOUNTER — Ambulatory Visit (INDEPENDENT_AMBULATORY_CARE_PROVIDER_SITE_OTHER): Payer: Managed Care, Other (non HMO) | Admitting: Neurology

## 2014-03-20 DIAGNOSIS — IMO0001 Reserved for inherently not codable concepts without codable children: Secondary | ICD-10-CM

## 2014-03-20 DIAGNOSIS — R269 Unspecified abnormalities of gait and mobility: Secondary | ICD-10-CM

## 2014-03-20 DIAGNOSIS — M797 Fibromyalgia: Secondary | ICD-10-CM

## 2014-03-20 DIAGNOSIS — Z0289 Encounter for other administrative examinations: Secondary | ICD-10-CM

## 2014-03-20 NOTE — Procedures (Signed)
     HISTORY:  Emma Wilson is a 58 year old patient with a history of fibromyalgia who reports problems with gait instability over the last one year. She has a limping gait involving the right leg. She is being evaluated for a possible neuropathy or a lumbosacral radiculopathy.  NERVE CONDUCTION STUDIES:  Nerve conduction studies were performed on the right upper extremity. The distal motor latencies and motor amplitudes for the median and ulnar nerves were within normal limits. The F wave latencies and nerve conduction velocities for these nerves were also normal. The sensory latencies for the median and ulnar nerves were normal.  Nerve conduction studies were performed on both lower extremities. The distal motor latencies and motor amplitudes for the peroneal and posterior tibial nerves were within normal limits. The nerve conduction velocities for these nerves were also normal. The H reflex latencies were normal. The sensory latencies for the peroneal nerves were within normal limits.   EMG STUDIES:  EMG study was performed on the right lower extremity:  The tibialis anterior muscle reveals 2 to 4K motor units with full recruitment. No fibrillations or positive waves were seen. The peroneus tertius muscle reveals 2 to 4K motor units with full recruitment. No fibrillations or positive waves were seen. The medial gastrocnemius muscle reveals 1 to 3K motor units with full recruitment. No fibrillations or positive waves were seen. The vastus lateralis muscle reveals 2 to 4K motor units with full recruitment. No fibrillations or positive waves were seen. The iliopsoas muscle reveals 2 to 4K motor units with full recruitment. No fibrillations or positive waves were seen. The biceps femoris muscle (long head) reveals 2 to 4K motor units with full recruitment. No fibrillations or positive waves were seen. The lumbosacral paraspinal muscles were tested at 3 levels, and revealed no abnormalities  of insertional activity at all 3 levels tested. There was good relaxation.   IMPRESSION:  Nerve conduction studies done on the right upper extremity and both lower extremities were within normal limits. No evidence of a peripheral neuropathy is seen. EMG evaluation of the right lower extremity is unremarkable, without evidence of an overlying right lumbosacral radiculopathy.  Jill Alexanders MD 03/20/2014 10:57 AM  [facility] [address] [place], [place] [address]  Phone [phone] Fax [phone]

## 2014-03-20 NOTE — Progress Notes (Signed)
Emma Wilson is a 58 year old patient with a history of fibromyalgia. Over the last year, she has had a limping gait involving the right leg, and some gait instability. She is being evaluated for this issue.  Nerve conduction studies done on the right upper extremity and both lower extremities were unremarkable. No evidence of a peripheral neuropathy is seen. EMG evaluation of the right lower extremity was unremarkable, without evidence of a lumbosacral radiculopathy.  The patient has ongoing issues with her gait. Blood work evaluation has been undertaking, all of the results are not available. The patient will be set up for MRI evaluation of the brain, and of the cervical spine to exclude the possibility of underlying demyelinating disease. I will contact the patient once the results are available to me. She will followup in about 3 months.

## 2014-03-21 ENCOUNTER — Telehealth: Payer: Self-pay | Admitting: Neurology

## 2014-03-21 DIAGNOSIS — R269 Unspecified abnormalities of gait and mobility: Secondary | ICD-10-CM

## 2014-03-21 LAB — RPR: RPR: NONREACTIVE

## 2014-03-21 LAB — VITAMIN B12: VITAMIN B 12: 649 pg/mL (ref 211–946)

## 2014-03-21 LAB — HIV ANTIBODY (ROUTINE TESTING W REFLEX)
HIV 1/O/2 Abs-Index Value: <1 (ref <1.00)
HIV-1/HIV-2 Ab: NONREACTIVE

## 2014-03-21 LAB — ENA+DNA/DS+SJORGEN'S
DSDNA AB: 1 [IU]/mL (ref 0–9)
ENA RNP Ab: <0.2 AI (ref 0.0–0.9)
ENA SM Ab Ser-aCnc: <0.2 AI (ref 0.0–0.9)
ENA SSB (LA) Ab: 1.6 AI — ABNORMAL HIGH (ref 0.0–0.9)

## 2014-03-21 LAB — CK: CK TOTAL: 109 U/L (ref 24–173)

## 2014-03-21 LAB — ANA W/REFLEX: Anti Nuclear Antibody(ANA): POSITIVE — AB

## 2014-03-21 LAB — ANGIOTENSIN CONVERTING ENZYME: Angio Convert Enzyme: 33 U/L (ref 14–82)

## 2014-03-21 LAB — COPPER, SERUM: COPPER: 126 ug/dL (ref 72–166)

## 2014-03-21 NOTE — Telephone Encounter (Signed)
I called the patient again and I discussed the results of the blood work, we will be checking MRI of the cervical spine and brain.

## 2014-03-21 NOTE — Telephone Encounter (Signed)
Pt called states she is sorry she missed Dr. Jannifer Franklin called and would like for him to call her back and let her know what is diagnosis is and that is fine to set up the test that needs to be done. Thanks

## 2014-03-21 NOTE — Telephone Encounter (Signed)
I called patient. The blood work was relatively unremarkable with exception that the ANA was positive, low titer elevation of the SSB antibody. Not sure that this is clinically significant.  As we had discussed on the prior visit, if the workup was unremarkable, we would pursue MRI the brain and cervical cord to rule out demyelinating disease. I'll get this set up.

## 2014-04-01 ENCOUNTER — Ambulatory Visit: Payer: Self-pay | Admitting: Family Medicine

## 2014-04-04 ENCOUNTER — Ambulatory Visit: Payer: Self-pay | Admitting: Family Medicine

## 2014-04-10 ENCOUNTER — Telehealth: Payer: Self-pay | Admitting: *Deleted

## 2014-04-10 NOTE — Telephone Encounter (Signed)
Dr. Jannifer Franklin is it Okay to give this patient some Xanax for her MRI tomorrow.

## 2014-04-10 NOTE — Telephone Encounter (Signed)
Okay to dispense Xanax packet for MRI.

## 2014-04-11 ENCOUNTER — Ambulatory Visit (INDEPENDENT_AMBULATORY_CARE_PROVIDER_SITE_OTHER): Payer: Managed Care, Other (non HMO)

## 2014-04-11 DIAGNOSIS — IMO0001 Reserved for inherently not codable concepts without codable children: Secondary | ICD-10-CM

## 2014-04-11 DIAGNOSIS — R269 Unspecified abnormalities of gait and mobility: Secondary | ICD-10-CM

## 2014-04-11 MED ORDER — GADOPENTETATE DIMEGLUMINE 469.01 MG/ML IV SOLN: 12.0000 mL | Freq: Once | INTRAVENOUS | Status: AC | PRN

## 2014-04-15 ENCOUNTER — Telehealth: Payer: Self-pay | Admitting: Neurology

## 2014-04-15 DIAGNOSIS — R269 Unspecified abnormalities of gait and mobility: Secondary | ICD-10-CM

## 2014-04-15 NOTE — Telephone Encounter (Signed)
I called patient. MRI brain and cervical spine showed no central nervous system issues calls issues with walking. I'll check MRI of the lumbosacral spine. He had some right leg discomfort, ongoing problems with walking.   MRI brain 04/11/2014:  Impression   Normal MRI scan the brain with and without contrast    MRI cervical spine Apr 11 2014:   IMPRESSION: Abnormal MRI scan of the cervical spine showing mild spondylytic changes throughout most prominent at C3-4 and C4-5 where there is moderate right-sided foraminal and mild left-sided foraminal narrowing but without definite compression.

## 2014-04-19 ENCOUNTER — Telehealth: Payer: Self-pay | Admitting: Family Medicine

## 2014-04-19 NOTE — Telephone Encounter (Signed)
Received signed records request to send records to [facility]. Records faxed to [phone]

## 2014-04-23 ENCOUNTER — Telehealth: Payer: Self-pay | Admitting: Neurology

## 2014-04-23 NOTE — Telephone Encounter (Signed)
Patient calling to state she has an MRI scheduled for tomorrow and is requesting a sedative to take before she has the MRI. Please call patient and advise.

## 2014-04-23 NOTE — Telephone Encounter (Signed)
Spoke with patient and she will pick up from front Check in when ready

## 2014-04-23 NOTE — Telephone Encounter (Signed)
Placed up front

## 2014-04-23 NOTE — Telephone Encounter (Signed)
I called the patient. OK to come by and get a packet of xanax for the MRI.

## 2014-04-24 ENCOUNTER — Encounter: Payer: Self-pay | Admitting: Neurology

## 2014-04-24 ENCOUNTER — Ambulatory Visit (INDEPENDENT_AMBULATORY_CARE_PROVIDER_SITE_OTHER): Payer: 59

## 2014-04-24 DIAGNOSIS — R269 Unspecified abnormalities of gait and mobility: Secondary | ICD-10-CM

## 2014-04-25 ENCOUNTER — Telehealth: Payer: Self-pay | Admitting: Neurology

## 2014-04-25 NOTE — Telephone Encounter (Signed)
I called patient. MRI of the lumbosacral spine did not show any issues of significance, particularly anything that would be causing troubles with the right leg. The patient indicates that she has not had any recent falls, and she may or may not have some pain in the right leg with weightbearing. The patient is doing some stretching exercises on a regular basis, she has undergone physical therapy previously. The prior blood work was relatively unremarkable. I will be seeing her back in the office at some point in the next several months.    MRI lumbar spine 04/25/2014:  IMPRESSION:  Abnormal MRI lumbar spine (without) demonstrating:  1. At L4-5: disc bulging and facet and ligamentum flavum hypertrophy with mild spinal stenosis and moderate left foraminal stenosis  2. At L3-4: facet hypertrophy with mild left foraminal stenosis  3. Small disc protrusions at L1-2 and L2-3, with migration along dorsal surface of L2 vertebral body; no spinal stenosis or foraminal narrowing/

## 2014-04-30 ENCOUNTER — Emergency Department (HOSPITAL_COMMUNITY)
Admission: EM | Admit: 2014-04-30 | Discharge: 2014-04-30 | Disposition: A | Discharge status: Home / Self Care | Payer: 59 | Attending: Emergency Medicine | Admitting: Emergency Medicine

## 2014-04-30 ENCOUNTER — Emergency Department (HOSPITAL_COMMUNITY): Payer: 59

## 2014-04-30 ENCOUNTER — Encounter (HOSPITAL_COMMUNITY): Payer: Self-pay | Admitting: Emergency Medicine

## 2014-04-30 DIAGNOSIS — Z791 Long term (current) use of non-steroidal anti-inflammatories (NSAID): Secondary | ICD-10-CM | POA: Insufficient documentation

## 2014-04-30 DIAGNOSIS — Z8701 Personal history of pneumonia (recurrent): Secondary | ICD-10-CM | POA: Insufficient documentation

## 2014-04-30 DIAGNOSIS — R0600 Dyspnea, unspecified: Secondary | ICD-10-CM

## 2014-04-30 DIAGNOSIS — J4489 Other specified chronic obstructive pulmonary disease: Secondary | ICD-10-CM | POA: Insufficient documentation

## 2014-04-30 DIAGNOSIS — J449 Chronic obstructive pulmonary disease, unspecified: Secondary | ICD-10-CM | POA: Insufficient documentation

## 2014-04-30 DIAGNOSIS — E039 Hypothyroidism, unspecified: Secondary | ICD-10-CM | POA: Insufficient documentation

## 2014-04-30 DIAGNOSIS — Z79899 Other long term (current) drug therapy: Secondary | ICD-10-CM | POA: Insufficient documentation

## 2014-04-30 DIAGNOSIS — Z8669 Personal history of other diseases of the nervous system and sense organs: Secondary | ICD-10-CM | POA: Insufficient documentation

## 2014-04-30 DIAGNOSIS — E785 Hyperlipidemia, unspecified: Secondary | ICD-10-CM | POA: Insufficient documentation

## 2014-04-30 DIAGNOSIS — J441 Chronic obstructive pulmonary disease with (acute) exacerbation: Secondary | ICD-10-CM | POA: Insufficient documentation

## 2014-04-30 DIAGNOSIS — Z85850 Personal history of malignant neoplasm of thyroid: Secondary | ICD-10-CM | POA: Insufficient documentation

## 2014-04-30 DIAGNOSIS — F17200 Nicotine dependence, unspecified, uncomplicated: Secondary | ICD-10-CM | POA: Insufficient documentation

## 2014-04-30 DIAGNOSIS — Z8679 Personal history of other diseases of the circulatory system: Secondary | ICD-10-CM | POA: Insufficient documentation

## 2014-04-30 DIAGNOSIS — G8929 Other chronic pain: Secondary | ICD-10-CM | POA: Insufficient documentation

## 2014-04-30 DIAGNOSIS — M1990 Unspecified osteoarthritis, unspecified site: Secondary | ICD-10-CM | POA: Insufficient documentation

## 2014-04-30 DIAGNOSIS — IMO0002 Reserved for concepts with insufficient information to code with codable children: Secondary | ICD-10-CM | POA: Insufficient documentation

## 2014-04-30 DIAGNOSIS — Z8719 Personal history of other diseases of the digestive system: Secondary | ICD-10-CM | POA: Insufficient documentation

## 2014-04-30 DIAGNOSIS — Z8659 Personal history of other mental and behavioral disorders: Secondary | ICD-10-CM | POA: Insufficient documentation

## 2014-04-30 LAB — CBC WITH DIFFERENTIAL/PLATELET
Basophils Absolute: 0.1 10*3/uL (ref 0.0–0.1)
Basophils Relative: 1 % (ref 0–1)
Eosinophils Absolute: 0.4 10*3/uL (ref 0.0–0.7)
Eosinophils Relative: 7 % — ABNORMAL HIGH (ref 0–5)
HEMATOCRIT: 35.4 % — AB (ref 36.0–46.0)
Hemoglobin: 12 g/dL (ref 12.0–15.0)
LYMPHS ABS: 2.5 10*3/uL (ref 0.7–4.0)
LYMPHS PCT: 42 % (ref 12–46)
MCH: 27 pg (ref 26.0–34.0)
MCHC: 33.9 g/dL (ref 30.0–36.0)
MCV: 79.7 fL (ref 78.0–100.0)
MONO ABS: 0.4 10*3/uL (ref 0.1–1.0)
Monocytes Relative: 7 % (ref 3–12)
NEUTROS ABS: 2.6 10*3/uL (ref 1.7–7.7)
Neutrophils Relative %: 43 % (ref 43–77)
Platelets: 244 10*3/uL (ref 150–400)
RBC: 4.44 MIL/uL (ref 3.87–5.11)
RDW: 15.1 % (ref 11.5–15.5)
WBC: 6 10*3/uL (ref 4.0–10.5)

## 2014-04-30 LAB — COMPREHENSIVE METABOLIC PANEL
ALT: 7 U/L (ref 0–35)
AST: 15 U/L (ref 0–37)
Albumin: 4.2 g/dL (ref 3.5–5.2)
Alkaline Phosphatase: 85 U/L (ref 39–117)
BILIRUBIN TOTAL: 0.4 mg/dL (ref 0.3–1.2)
BUN: 11 mg/dL (ref 6–23)
CALCIUM: 9.4 mg/dL (ref 8.4–10.5)
CHLORIDE: 103 meq/L (ref 96–112)
CO2: 25 mEq/L (ref 19–32)
CREATININE: 0.74 mg/dL (ref 0.50–1.10)
GFR calc non Af Amer: >90 mL/min (ref >90)
GLUCOSE: 71 mg/dL (ref 70–99)
Potassium: 3.4 mEq/L — ABNORMAL LOW (ref 3.7–5.3)
Sodium: 142 mEq/L (ref 137–147)
Total Protein: 7.8 g/dL (ref 6.0–8.3)

## 2014-04-30 NOTE — ED Notes (Signed)
Pt. woke up this morning with SOB and chest discomfort " pressure" , denies cough or congestion .

## 2014-04-30 NOTE — ED Provider Notes (Signed)
CSN: [id number]     Arrival date & time 04/30/14  1761 History   First MD Initiated Contact with Patient 04/30/14 0730     Chief Complaint  Patient presents with  . Shortness of Breath      HPI  Patient presents with concern of mild dyspnea, chest discomfort. Symptoms began approximately 3 hours prior to my evaluation.  Patient returned home from work and, with the subacute onset of her discomfort, specifically no pain, across her thorax. There was associated mild dyspnea. Symptoms do not resolve with rest, but have improved substantially in the emergency department. She specifically denies any pain, lightheadedness, vomiting, nausea, confusion, disorientation, other focal complaints. She continues to have mild dyspnea. She endorses a history of multiple medical problems including fibromyalgia, arthritis.   Past Medical History  Diagnosis Date  . COPD (chronic obstructive pulmonary disease)   . GERD (gastroesophageal reflux disease)   . Sinus bradycardia   . Hypothyroidism   . Hematuria     microscopic hematuria (urol & nephrol w/u neg), multiple UTI`s  . Fibromyalgia   . Carpal tunnel syndrome     right wrist  . Hyperlipidemia     managed by Dr. Ouida Sills  . Microscopic hematuria     glomerular from sickle cell trait (per Dr. Jeffie Pollock 02/2013)  . Pneumonia     "abuntantly as a child"  . Shortness of breath     "at rest" (12/05/2013)  . Migraine     "q 3-4 months" (12/05/2013)  . Chronic back pain   . Osteoarthritis     Rheumatoid and OA.  sees Dr. Ouida Sills [facility] medical, rheumatology)  . Rheumatoid arthritis   . Depression 2006    "after my son passed" (12/05/2013)  . Thyroid cancer     S/P radiation & OR  . Abnormality of gait 03/19/2014   Past Surgical History  Procedure Laterality Date  . Thyroidectomy, partial  11/2005; 11/2006  . Cystoscopy  02/26/2013    Dr. Jeffie Pollock (normal; follicular cystitis)  . Tubal ligation  1980's  . Total abdominal hysterectomy  ~ 1990    Family History  Problem Relation Age of Onset  . Stroke Mother   . Coronary artery disease Mother   . Kidney disease Mother     on dialysis  . Cancer Father     bone cancer  . Arthritis Sister     rheumatoid and osteoarthritis  . Arthritis Sister     RA and OA  . Diabetes Son    History  Substance Use Topics  . Smoking status: Current Every Day Smoker -- 0.50 packs/day for 34 years    Types: Cigarettes  . Smokeless tobacco: Never Used  . Alcohol Use: Yes     Comment: 12/05/2013 "glass of wine 1-2 times/yr"   OB History   Grav Para Term Preterm Abortions TAB SAB Ect Mult Living                 Review of Systems  Constitutional:       Per HPI, otherwise negative  HENT:       Per HPI, otherwise negative  Respiratory:       Per HPI, otherwise negative  Cardiovascular:       Per HPI, otherwise negative  Gastrointestinal: Negative for vomiting.  Endocrine:       Negative aside from HPI  Genitourinary:       Neg aside from HPI   Musculoskeletal:       Per  HPI, otherwise negative  Skin: Negative.   Neurological: Negative for syncope.      Allergies  Review of patient's allergies indicates no known allergies.  Home Medications   Prior to Admission medications   Medication Sig Start Date End Date Taking? Authorizing Kamille Toomey  Acetaminophen (TYLENOL ARTHRITIS EXT RELIEF PO) Take 1 tablet by mouth every 4 (four) hours as needed (pain).   Yes Historical Navdeep Halt, MD  Ascorbic Acid (VITAMIN C) 500 MG tablet Take 500 mg by mouth daily.    Yes Historical Nicolaas Savo, MD  cholecalciferol (VITAMIN D) 1000 UNITS tablet Take 1,000 Units by mouth daily.   Yes Historical Destan Franchini, MD  diclofenac sodium (VOLTAREN) 1 % GEL Apply 1 application topically 4 (four) times daily.   Yes Historical Anothony Bursch, MD  LYRICA 150 MG capsule Take 150 mg by mouth 2 (two) times daily.  12/06/13  Yes Historical Shin Lamour, MD  mometasone (NASONEX) 50 MCG/ACT nasal spray Place 2 sprays into the nose  daily as needed (allergies).   Yes Historical Phylis Javed, MD  Multiple Vitamin (MULTIVITAMIN) tablet Take 1 tablet by mouth daily.   Yes Historical Kass Herberger, MD  naproxen (NAPROSYN) 500 MG tablet Take 500 mg by mouth every 12 (twelve) hours.   Yes Historical Rolinda Impson, MD  SYNTHROID 137 MCG tablet Take 1 tablet (137 mcg total) by mouth daily before breakfast. 01/31/14  Yes Rita Ohara, MD  traMADol (ULTRAM) 50 MG tablet Take 50 mg by mouth 4 (four) times daily.    Yes Historical Latrice Storlie, MD   BP 134/90  Pulse 60  Temp(Src) 97.6 F (36.4 C)  Resp 18  Ht 5\' 7"  (1.702 m)  Wt 138 lb (62.596 kg)  BMI 21.61 kg/m2  SpO2 100% Physical Exam  Nursing note and vitals reviewed. Constitutional: She is oriented to person, place, and time. She appears well-developed and well-nourished. No distress.  HENT:  Head: Normocephalic and atraumatic.  Eyes: Conjunctivae and EOM are normal.  Cardiovascular: Normal rate and regular rhythm.   Pulmonary/Chest: Effort normal and breath sounds normal. No stridor. No respiratory distress.  Abdominal: She exhibits no distension.  Musculoskeletal: She exhibits no edema.  Neurological: She is alert and oriented to person, place, and time. No cranial nerve deficit. She exhibits normal muscle tone. Coordination normal.  Patient notes a history of intermittent leg weakness, though none currently  Skin: Skin is warm and dry.  Psychiatric: She has a normal mood and affect.    ED Course  Procedures (including critical care time) Labs Review Labs Reviewed  CBC WITH DIFFERENTIAL - Abnormal; Notable for the following:    HCT 35.4 (*)    Eosinophils Relative 7 (*)    All other components within normal limits  COMPREHENSIVE METABOLIC PANEL    Imaging Review Dg Chest 2 View  04/30/2014   CLINICAL DATA:  Shortness of breath.  Fibromyalgia pain.  EXAM: CHEST  2 VIEW  COMPARISON:  12/05/2013  FINDINGS: The cardiomediastinal silhouette is within normal limits. The lungs are  well inflated and clear. There is no evidence of pleural effusion or pneumothorax. No acute osseous abnormality is identified.  IMPRESSION: No active cardiopulmonary disease.   Electronically Signed   By: Logan Bores   On: 04/30/2014 07:44     EKG Interpretation   Date/Time:  Tuesday April 30 2014 06:57:57 EDT Ventricular Rate:  56 PR Interval:  180 QRS Duration: 82 QT Interval:  428 QTC Calculation: 413 R Axis:   65 Text Interpretation:  Sinus bradycardia Nonspecific T wave  abnormality  Abnormal ECG Sinus bradycardia T wave abnormality Abnormal ekg Confirmed  by Carmin Muskrat  MD [phone]) on 04/30/2014 7:38:26 AM     After the initial evaluation I reviewed the patient's chart.  8:35 AM Patient in no distress.  We discussed all findings.  I discussed empiric therapy for possible COPD exacerbation, but the patient deferred my recommendation for steroids. She will follow up with her primary care physician today via telephone.   MDM   Patient presents after an episode of generalized discomfort, specifically in the thorax as well as dyspnea. No cough, no congestion, hemodynamically stable, awake, alert, in no distress. Patient has multiple medical problems, though essentially a very low risk profile for atypical ACS as well as any other ongoing coronary ischemia given her reassuring findings. No evidence for pneumonia, nor thromboembolic processes. No evidence for occult infection. Patient was discharged in stable condition to follow up with primary care.     Carmin Muskrat, MD 04/30/14 [phone]

## 2014-05-03 ENCOUNTER — Telehealth: Payer: Self-pay | Admitting: Family Medicine

## 2014-05-03 NOTE — Telephone Encounter (Signed)
Pt dropped off paper to be completed. These appear to be medical restriction forms to be completed for the [employer]. I am sending these back in the folder she dropped off on the pt's chart. One of the pages is a request for medical records that needs to be returned to [place] to complete that process. Please call pt at [phone] when complete for pick up.

## 2014-05-06 DIAGNOSIS — Z0289 Encounter for other administrative examinations: Secondary | ICD-10-CM

## 2014-05-06 NOTE — Telephone Encounter (Signed)
done

## 2014-05-06 NOTE — Telephone Encounter (Signed)
Pt was called and message was relayed. Pt states that the [employer] is requiring her to give these to all her doctors. She states that her rheum and neuro will be filling these out. She is asking that if you don't want to fill them out if you would please indicated somewhere on these forms "refused". I am again sending these back on her chart.

## 2014-05-06 NOTE — Telephone Encounter (Signed)
I feel these forms would most appropriately be completed by her rheumatologist and/or neurologist (they treat her rheumatoid arthritis, osteoarthritis and fibromyalgia, and she has recently seen them).  Seen here for abdominal pain/UTI

## 2014-05-07 ENCOUNTER — Telehealth: Payer: Self-pay | Admitting: Family Medicine

## 2014-05-07 NOTE — Telephone Encounter (Signed)
Left message for pt to call. I need to speak to her concerning form and medical records.

## 2014-05-07 NOTE — Telephone Encounter (Signed)
Pt called and she was informed that forms are ready. Pt informed me that she did not want the medical records request fulfilled. Pt requested that forms be mailed to her. So  copies made of medical records request noted that she did not want them sent also copies of form with Dr. Tomi Bamberger denial of completion made and ordinals mailed to pt.

## 2014-05-28 ENCOUNTER — Ambulatory Visit (INDEPENDENT_AMBULATORY_CARE_PROVIDER_SITE_OTHER): Payer: 59

## 2014-05-28 VITALS — BP 138/86 | HR 60 | Resp 12

## 2014-05-28 DIAGNOSIS — R52 Pain, unspecified: Secondary | ICD-10-CM

## 2014-05-28 DIAGNOSIS — S93609A Unspecified sprain of unspecified foot, initial encounter: Secondary | ICD-10-CM

## 2014-05-28 DIAGNOSIS — S90121A Contusion of right lesser toe(s) without damage to nail, initial encounter: Secondary | ICD-10-CM

## 2014-05-28 DIAGNOSIS — S90129A Contusion of unspecified lesser toe(s) without damage to nail, initial encounter: Secondary | ICD-10-CM

## 2014-05-28 DIAGNOSIS — R609 Edema, unspecified: Secondary | ICD-10-CM

## 2014-05-28 NOTE — Progress Notes (Signed)
   Subjective:    Patient ID: Emma Wilson, female    DOB: Nov 29, 1955, 58 y.o.   MRN: [id number]  HPI  NEW PROBLEM:  PT STATED INJURED RT FOOT 5TH TOE ABOUT 2 WEEKS AGO AND STILL PAINFUL. THE TOE IS GETTING LITTLE BETTER. THE TOE GET AGGRAVATED BY PRESSURE OR WEARING CERTAIN TYPES OF SHOES. TRIED NO TREATMENT.    Review of Systems any systemic changes or findings does have a history of fibromyalgia and arthropathy as well as peripheral neuropathy affecting both feet. Injury occurred on June 26 or fifth toe right foot is been swollen and painful since then hasn't been able to wear shoe due to the swelling of the toe.     Objective:   Physical Exam Neurovascular status is intact pedal pulses palpable DP and PT +2/4 bilateral capillary refill 3 seconds all digits epicritic and proprioceptive sensations intact and symmetric. There is edema of the fifth digit right foot secondary to contusion injury no open wounds ulcerations no lacerations noted orthopedic exam otherwise unremarkable x-rays reveal no fracture of the digits appears rectus soft tissue swelling is noted clinically and radiographically.       Assessment & Plan:  Assessment contusion with possible sprain of the fifth toe MTP and IP joint. At this time Coflex wrap in buddy wrap in the fifth digit to the third and fourth toes is carried out dispensed couple rolls of Coflex wrap for patient to maintain buddy wrap in of her toes for at least a month as indicated. Also recommended ice pack and elevation wide accommodative roomy shoes avoid entire constrictive. Also avoid barefoot or flimsy shoes or flip-flops. Should be able to resume walking and work activities there is no bone fracture no displacement over time swelling and edema should resolve within a month or 2. Recommended Tylenol or Advil if needed for pain patient very taking multiple pain medications and neuropathy medicines followup in a month if no improvement  Harriet Masson  DPM

## 2014-05-28 NOTE — Patient Instructions (Signed)

## 2014-05-29 ENCOUNTER — Telehealth: Payer: Self-pay | Admitting: *Deleted

## 2014-05-29 NOTE — Telephone Encounter (Signed)
I'm a patient, I was in on yesterday.  I returned her call and asked how I could help her.  She stated she was calling to get a note to return to work but she stated she is unsure.  She stated for instance today she was down stairs this morning piddling around and had to go upstairs, lie down and elevate because it started to swell.  She started crying and stated she doesn't know what she's going to do with that foot.  I told her I hope she starts to feel better.  She stated she will call once she makes a decision as to what she wants to do.

## 2014-06-13 ENCOUNTER — Other Ambulatory Visit (HOSPITAL_COMMUNITY): Payer: PRIVATE HEALTH INSURANCE | Attending: Psychiatry | Admitting: Psychiatry

## 2014-06-13 ENCOUNTER — Encounter (HOSPITAL_COMMUNITY): Payer: Self-pay

## 2014-06-13 DIAGNOSIS — F332 Major depressive disorder, recurrent severe without psychotic features: Secondary | ICD-10-CM | POA: Insufficient documentation

## 2014-06-13 DIAGNOSIS — K219 Gastro-esophageal reflux disease without esophagitis: Secondary | ICD-10-CM | POA: Insufficient documentation

## 2014-06-13 DIAGNOSIS — G4700 Insomnia, unspecified: Secondary | ICD-10-CM | POA: Insufficient documentation

## 2014-06-13 DIAGNOSIS — F331 Major depressive disorder, recurrent, moderate: Secondary | ICD-10-CM

## 2014-06-13 DIAGNOSIS — J449 Chronic obstructive pulmonary disease, unspecified: Secondary | ICD-10-CM | POA: Insufficient documentation

## 2014-06-13 DIAGNOSIS — Z923 Personal history of irradiation: Secondary | ICD-10-CM | POA: Insufficient documentation

## 2014-06-13 DIAGNOSIS — IMO0001 Reserved for inherently not codable concepts without codable children: Secondary | ICD-10-CM | POA: Insufficient documentation

## 2014-06-13 DIAGNOSIS — Z79899 Other long term (current) drug therapy: Secondary | ICD-10-CM | POA: Insufficient documentation

## 2014-06-13 DIAGNOSIS — M1990 Unspecified osteoarthritis, unspecified site: Secondary | ICD-10-CM | POA: Insufficient documentation

## 2014-06-13 DIAGNOSIS — J4489 Other specified chronic obstructive pulmonary disease: Secondary | ICD-10-CM | POA: Insufficient documentation

## 2014-06-13 DIAGNOSIS — E785 Hyperlipidemia, unspecified: Secondary | ICD-10-CM | POA: Insufficient documentation

## 2014-06-13 DIAGNOSIS — E039 Hypothyroidism, unspecified: Secondary | ICD-10-CM | POA: Insufficient documentation

## 2014-06-13 DIAGNOSIS — M069 Rheumatoid arthritis, unspecified: Secondary | ICD-10-CM | POA: Insufficient documentation

## 2014-06-13 DIAGNOSIS — Z85850 Personal history of malignant neoplasm of thyroid: Secondary | ICD-10-CM | POA: Insufficient documentation

## 2014-06-13 DIAGNOSIS — F411 Generalized anxiety disorder: Secondary | ICD-10-CM

## 2014-06-13 DIAGNOSIS — F17200 Nicotine dependence, unspecified, uncomplicated: Secondary | ICD-10-CM | POA: Insufficient documentation

## 2014-06-13 MED ORDER — MIRTAZAPINE 15 MG PO TABS: 15.0000 mg | ORAL_TABLET | Freq: Every day | ORAL | Status: DC
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
## 2014-06-14 ENCOUNTER — Other Ambulatory Visit (HOSPITAL_COMMUNITY): Payer: PRIVATE HEALTH INSURANCE | Admitting: Psychiatry

## 2014-06-14 DIAGNOSIS — F332 Major depressive disorder, recurrent severe without psychotic features: Secondary | ICD-10-CM | POA: Diagnosis not present

## 2014-06-14 DIAGNOSIS — J449 Chronic obstructive pulmonary disease, unspecified: Secondary | ICD-10-CM | POA: Diagnosis not present

## 2014-06-14 DIAGNOSIS — IMO0001 Reserved for inherently not codable concepts without codable children: Secondary | ICD-10-CM | POA: Diagnosis not present

## 2014-06-14 DIAGNOSIS — Z85850 Personal history of malignant neoplasm of thyroid: Secondary | ICD-10-CM | POA: Diagnosis not present

## 2014-06-14 DIAGNOSIS — Z923 Personal history of irradiation: Secondary | ICD-10-CM | POA: Diagnosis not present

## 2014-06-14 DIAGNOSIS — G4700 Insomnia, unspecified: Secondary | ICD-10-CM | POA: Diagnosis not present

## 2014-06-14 DIAGNOSIS — F17200 Nicotine dependence, unspecified, uncomplicated: Secondary | ICD-10-CM | POA: Diagnosis not present

## 2014-06-14 DIAGNOSIS — E039 Hypothyroidism, unspecified: Secondary | ICD-10-CM | POA: Diagnosis not present

## 2014-06-14 DIAGNOSIS — M1990 Unspecified osteoarthritis, unspecified site: Secondary | ICD-10-CM | POA: Diagnosis not present

## 2014-06-14 DIAGNOSIS — F331 Major depressive disorder, recurrent, moderate: Secondary | ICD-10-CM

## 2014-06-14 DIAGNOSIS — Z79899 Other long term (current) drug therapy: Secondary | ICD-10-CM | POA: Diagnosis not present

## 2014-06-14 DIAGNOSIS — M069 Rheumatoid arthritis, unspecified: Secondary | ICD-10-CM | POA: Diagnosis not present

## 2014-06-14 DIAGNOSIS — E785 Hyperlipidemia, unspecified: Secondary | ICD-10-CM | POA: Diagnosis not present

## 2014-06-14 DIAGNOSIS — K219 Gastro-esophageal reflux disease without esophagitis: Secondary | ICD-10-CM | POA: Diagnosis not present
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
## 2014-06-14 NOTE — Progress Notes (Signed)
    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  "She just always lying."  Pt states that she just recently received a call that she got the new job, in which she requested.  According to pt, that took a lot of stress off her.  Pt continues to work on grief/loss issues.     Group Time: 1045-1200  Participation Level:  Active  Behavioral Response: Appropriate  Type of Therapy: Psycho-education Group  Summary of Progress:  Discussed how stress can affect the body and explored fifteen ways to beat stress. Practiced deep breathing, guided imagery, and progressive relaxation.

## 2014-06-17 ENCOUNTER — Other Ambulatory Visit (HOSPITAL_COMMUNITY): Payer: PRIVATE HEALTH INSURANCE | Attending: Psychiatry | Admitting: Psychiatry

## 2014-06-17 DIAGNOSIS — Z85850 Personal history of malignant neoplasm of thyroid: Secondary | ICD-10-CM | POA: Insufficient documentation

## 2014-06-17 DIAGNOSIS — IMO0001 Reserved for inherently not codable concepts without codable children: Secondary | ICD-10-CM | POA: Diagnosis not present

## 2014-06-17 DIAGNOSIS — F332 Major depressive disorder, recurrent severe without psychotic features: Secondary | ICD-10-CM | POA: Insufficient documentation

## 2014-06-17 DIAGNOSIS — E039 Hypothyroidism, unspecified: Secondary | ICD-10-CM | POA: Insufficient documentation

## 2014-06-17 DIAGNOSIS — F411 Generalized anxiety disorder: Secondary | ICD-10-CM | POA: Diagnosis not present

## 2014-06-17 DIAGNOSIS — F17200 Nicotine dependence, unspecified, uncomplicated: Secondary | ICD-10-CM | POA: Insufficient documentation

## 2014-06-17 DIAGNOSIS — K219 Gastro-esophageal reflux disease without esophagitis: Secondary | ICD-10-CM | POA: Insufficient documentation

## 2014-06-17 DIAGNOSIS — Z79899 Other long term (current) drug therapy: Secondary | ICD-10-CM | POA: Diagnosis not present

## 2014-06-17 DIAGNOSIS — J4489 Other specified chronic obstructive pulmonary disease: Secondary | ICD-10-CM | POA: Insufficient documentation

## 2014-06-17 DIAGNOSIS — M1990 Unspecified osteoarthritis, unspecified site: Secondary | ICD-10-CM | POA: Insufficient documentation

## 2014-06-17 DIAGNOSIS — G4700 Insomnia, unspecified: Secondary | ICD-10-CM | POA: Diagnosis not present

## 2014-06-17 DIAGNOSIS — J449 Chronic obstructive pulmonary disease, unspecified: Secondary | ICD-10-CM | POA: Diagnosis not present

## 2014-06-17 DIAGNOSIS — M069 Rheumatoid arthritis, unspecified: Secondary | ICD-10-CM | POA: Diagnosis not present

## 2014-06-17 DIAGNOSIS — E785 Hyperlipidemia, unspecified: Secondary | ICD-10-CM | POA: Insufficient documentation

## 2014-06-17 DIAGNOSIS — Z923 Personal history of irradiation: Secondary | ICD-10-CM | POA: Insufficient documentation

## 2014-06-17 DIAGNOSIS — F331 Major depressive disorder, recurrent, moderate: Secondary | ICD-10-CM

## 2014-06-17 NOTE — Progress Notes (Signed)
Patient ID: [name], female   DOB: [birth year], 57 y.o.   MRN: [id number] Patient was seen today states she is currently on Remeron 15 mg at bedtime and has been able to sleep well. Her appetite has also increased. Patient states that she slept a lot over the weekend her energy continues to be poor her anger appears to be a little better. She's not as hostile as before. Continues to have poor motivation still feels hopeless and helpless and denies thoughts of suicide or homicide. She's tolerating her medications well. Patient states she needs a letter for her employer

## 2014-06-17 NOTE — Progress Notes (Signed)
    Daily Group Progress Note  Program: IOP  Group Time: 9-10:00  Participation Level: Active  Behavioral Response: Appropriate and Sharing  Type of Therapy:  Group Therapy  Summary of Progress: Patient was pleasant and sharing stated that she is here to learn more assertive ways of communicating. She says she is having difficulty with her boss and depression from losing her son in 2006. She would like to learn ways to cope with difficult feelings that arise in her life. She states that she has a lot of loss and that affects the way she feels. She also states that one of her strengths is that she has optimism. Patient was introduced to heart math as a coping mechanism and responded well stating that it was relaxing for her. The group also discussed negative thinking patterns and facilitator read about thought stopping and the benefits of letting go of ruminating thoughts.      Group Time: 10:15-12:00  Participation Level:  Active  Behavioral Response: Appropriate and Sharing  Type of Therapy: Psycho-education Group  Summary of Progress: Patient was introduced to the cognitive triangle and discussed how thoughts feelings and actions are connected. This was followed by a discussion on how changing one part of the triangle can influence each part in a positive way. Patient actively participated in discussion and was pleasant and attentive. The [place] followed up with a discussion on grief and loss.   [facility]

## 2014-06-18 ENCOUNTER — Other Ambulatory Visit (HOSPITAL_COMMUNITY): Payer: PRIVATE HEALTH INSURANCE | Admitting: Psychiatry

## 2014-06-18 DIAGNOSIS — F331 Major depressive disorder, recurrent, moderate: Secondary | ICD-10-CM

## 2014-06-18 DIAGNOSIS — F332 Major depressive disorder, recurrent severe without psychotic features: Secondary | ICD-10-CM | POA: Diagnosis not present

## 2014-06-18 NOTE — Progress Notes (Signed)
    Daily Group Progress Note  Program: IOP  Group Time: 9:00-10:30  Participation Level: Active  Behavioral Response: Appropriate and Sharing  Type of Therapy:  Process Group  Summary of Progress:  The group discussed and processed the barriers and benefits of change. Facilitator gave out a handout about change and asked pts to read it and discuss their thoughts. Pt was receptive and open to discussion and participated well. She stated that there are changes she knows she needs to make. Pt was physically uncomfortable today and stated she was in pain.      Group Time: 10:45-12:00  Participation Level:  Active  Behavioral Response: Appropriate and Sharing  Type of Therapy: Psycho-education Group  Summary of Progress: Group discussion was around making changes and goal setting. Facilitator introduced S.M.A.R.T. goals acronym and had each pt write an attainable goal to complete by Friday. Group discussed barriers and challenges to completing these goals along with supports and ways to stay on track. Pt stated her goal was to accept past failures and move forward. She stated she would work on this by doing some meditative reading and writing about her feelings. She stated that the S.M.A.R.T goals framework was helpful for her to set an attainable goal. Pt chose not to participate in heartmath because she was in pain.   [address] [place], [place]  [place], [place], [place]

## 2014-06-19 ENCOUNTER — Other Ambulatory Visit (HOSPITAL_COMMUNITY): Payer: PRIVATE HEALTH INSURANCE

## 2014-06-20 ENCOUNTER — Other Ambulatory Visit (HOSPITAL_COMMUNITY): Payer: PRIVATE HEALTH INSURANCE | Admitting: Psychiatry

## 2014-06-20 DIAGNOSIS — F332 Major depressive disorder, recurrent severe without psychotic features: Secondary | ICD-10-CM | POA: Diagnosis not present

## 2014-06-21 ENCOUNTER — Other Ambulatory Visit (HOSPITAL_COMMUNITY): Payer: PRIVATE HEALTH INSURANCE | Admitting: Psychiatry

## 2014-06-21 DIAGNOSIS — F331 Major depressive disorder, recurrent, moderate: Secondary | ICD-10-CM

## 2014-06-21 DIAGNOSIS — F332 Major depressive disorder, recurrent severe without psychotic features: Secondary | ICD-10-CM | POA: Diagnosis not present

## 2014-06-21 NOTE — Progress Notes (Signed)
    Daily Group Progress Note  Program: IOP Group Time: 9-10:45  Participation Level: Active  Behavioral Response: Appropriate and Sharing  Type of Therapy:  Process Group  Summary of Progress: Clinician reviewed yesterday's topic of communication and processed what they learned about passive, assertive and aggressive communication styles. Clinician helped pts process their feelings about where they are today and went over the goals they set on tuesday. Pt spoke about her son briefly and stated that she often thinks about him when she is anxious. She stated that she is doing well today and was able to articulate how to be assertive and use I statements. She also stated that she worked on her goal which was to let go of past failures by finding a quote that stated: I am me, the best me I can be.    Group Time: 11:00-12:00  Participation Level:  Active  Behavioral Response: Appropriate and Sharing  Type of Therapy: Psycho-education Group  Summary of Progress: Facilitator showed a Clare Gandy Talk about cognitive restructuring and changing perspectives. The video talked about how people perceive something when it is said to be negative. For example when someone states that the glass is half empty another person may perceive this as a loss and think of the glass as half full as being a gain even though the amount of liquid in the glass is the exact same. The pts talked about how they could apply this to their situation and think about the same situation in a different way. Facilitator also presented on rumination and how it can be helpful and unhelpful at times. The pts were asked to give examples of unhealthy rumination and healthy rumination and applied the concept of the ted talk to change their thought patterns and come up with a solution to the problem instead of dwelling on what they can't control.   [employer], [employer]  [facility]

## 2014-06-24 ENCOUNTER — Other Ambulatory Visit (HOSPITAL_COMMUNITY): Payer: PRIVATE HEALTH INSURANCE | Admitting: Psychiatry

## 2014-06-24 ENCOUNTER — Encounter (HOSPITAL_COMMUNITY): Payer: Self-pay | Admitting: Psychiatry

## 2014-06-24 DIAGNOSIS — F332 Major depressive disorder, recurrent severe without psychotic features: Secondary | ICD-10-CM | POA: Diagnosis not present

## 2014-06-24 DIAGNOSIS — F331 Major depressive disorder, recurrent, moderate: Secondary | ICD-10-CM

## 2014-06-24 NOTE — Progress Notes (Signed)
    Daily Group Progress Note  Program: IOP  Group Time: 9:00-10:30  Participation Level: Active  Behavioral Response: Appropriate  Type of Therapy:  Group Therapy  Summary of Progress: Pt. Shared stress of loss of recent relationship and relationship trauma/betrayal. Pt. Shared resources that have helped her to manage her grief related to the loss of her son such as being a church pianist and finding support within her family.     Group Time: 10:30-12:00  Participation Level:  Active  Behavioral Response: Appropriate  Type of Therapy: Psycho-education Group  Summary of Progress: Pt. Participated in discussion about the autonomic nervous system and the stigma of mental illness. Participated in discussion about [employer] video.  Nancie Neas, COUNS
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
## 2014-06-25 ENCOUNTER — Encounter (HOSPITAL_COMMUNITY): Payer: Self-pay | Admitting: Psychiatry

## 2014-06-25 ENCOUNTER — Other Ambulatory Visit (HOSPITAL_COMMUNITY): Payer: PRIVATE HEALTH INSURANCE | Admitting: Psychiatry

## 2014-06-25 DIAGNOSIS — F332 Major depressive disorder, recurrent severe without psychotic features: Secondary | ICD-10-CM | POA: Diagnosis not present

## 2014-06-25 DIAGNOSIS — F331 Major depressive disorder, recurrent, moderate: Secondary | ICD-10-CM

## 2014-06-25 DIAGNOSIS — F411 Generalized anxiety disorder: Secondary | ICD-10-CM

## 2014-06-25 NOTE — Progress Notes (Signed)
    Daily Group Progress Note  Program: IOP  Group Time: 9:00-10:30  Participation Level: Active  Behavioral Response: Appropriate  Type of Therapy:  Group Therapy  Summary of Progress: Pt. Was alert and attentive, presented with euthymic mood. Pt. 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     Group Time: 10:30-12:00  Participation Level:  Active  Behavioral Response: Appropriate  Type of Therapy: Psycho-education Group  Summary of Progress: Pt. Participated in discussion about developing mindfulness and using meditation as a coping skill.  Nancie Neas, COUNS

## 2014-06-26 ENCOUNTER — Other Ambulatory Visit (HOSPITAL_COMMUNITY): Payer: PRIVATE HEALTH INSURANCE | Admitting: Psychiatry

## 2014-06-26 DIAGNOSIS — F331 Major depressive disorder, recurrent, moderate: Secondary | ICD-10-CM

## 2014-06-26 DIAGNOSIS — F332 Major depressive disorder, recurrent severe without psychotic features: Secondary | ICD-10-CM | POA: Diagnosis not present

## 2014-06-26 DIAGNOSIS — F411 Generalized anxiety disorder: Secondary | ICD-10-CM

## 2014-06-27 ENCOUNTER — Encounter (HOSPITAL_COMMUNITY): Payer: Self-pay | Admitting: Psychiatry

## 2014-06-27 ENCOUNTER — Other Ambulatory Visit (HOSPITAL_COMMUNITY): Payer: PRIVATE HEALTH INSURANCE | Admitting: Psychiatry

## 2014-06-27 DIAGNOSIS — F332 Major depressive disorder, recurrent severe without psychotic features: Secondary | ICD-10-CM | POA: Diagnosis not present

## 2014-06-27 NOTE — Progress Notes (Signed)
    Daily Group Progress Note  Program: IOP  Group Time: 9:00-10:30  Participation Level: Active  Behavioral Response: Appropriate  Type of Therapy:  Psycho-education Group  Summary of Progress: Pt. Was alert and attentive during grief and loss facilitated by Jeanella Craze.     Group Time: 10:30-12:00  Participation Level:  Active  Behavioral Response: Appropriate  Type of Therapy: Group Therapy  Summary of Progress: Pt. Participated in discussion about developing healthy relationship boundaries, normalizing range of emotions, and finding constructive behaviors to help process anger (i.e., therapy, talking to loved ones, service to others, physical exercise).  Nancie Neas, COUNS

## 2014-06-28 ENCOUNTER — Encounter (HOSPITAL_COMMUNITY): Payer: Self-pay | Admitting: Psychiatry

## 2014-06-28 ENCOUNTER — Other Ambulatory Visit (HOSPITAL_COMMUNITY): Payer: PRIVATE HEALTH INSURANCE | Admitting: Psychiatry

## 2014-06-28 DIAGNOSIS — F331 Major depressive disorder, recurrent, moderate: Secondary | ICD-10-CM

## 2014-06-28 NOTE — Progress Notes (Signed)
    Daily Group Progress Note  Program: IOP  Group Time: 9:00-10:30  Participation Level: Active  Behavioral Response: Appropriate  Type of Therapy:  Group Therapy  Summary of Progress: Pt. Reports that she continues to work through grief and anger directed toward her work environment. Pt. Continues to have strong family support system and receives feedback from the group.     Group Time: 10:30-12:00  Participation Level:  Active  Behavioral Response: Appropriate  Type of Therapy: Psycho-education Group  Summary of Progress: Pt. Participated in discussion about shame and using vulnerability to build shame resistance. Pt. Watched [employer] video.  Nancie Neas, COUNS

## 2014-06-28 NOTE — Progress Notes (Signed)
    Daily Group Progress Note  Program: IOP  Group Time: 9:00-10:30  Participation Level: Active  Behavioral Response: Appropriate  Type of Therapy:  Group Therapy  Summary of Progress: Pt. Complained of fibromialgia pain. Pt. Discussed the hurt caused by her children who she perceives value her for her money but do not call and check on her. Pt. Continues to grieve and burdened by thoughts of unfairness related to her son who died in Mar 15, 2005.     Group Time: 10:30-12:00  Participation Level:  Active  Behavioral Response: Appropriate  Type of Therapy: Psycho-education Group  Summary of Progress: Pt. Was alert and attentive during instruction about breathing. Pt. Participated in guided meditation. Watched [employer] video about blame.  Nancie Neas, COUNS

## 2014-07-01 ENCOUNTER — Encounter (HOSPITAL_COMMUNITY): Payer: Self-pay | Admitting: Psychiatry

## 2014-07-01 ENCOUNTER — Other Ambulatory Visit (HOSPITAL_COMMUNITY): Payer: PRIVATE HEALTH INSURANCE | Admitting: Psychiatry

## 2014-07-01 DIAGNOSIS — F331 Major depressive disorder, recurrent, moderate: Secondary | ICD-10-CM

## 2014-07-01 DIAGNOSIS — F411 Generalized anxiety disorder: Secondary | ICD-10-CM

## 2014-07-01 DIAGNOSIS — F332 Major depressive disorder, recurrent severe without psychotic features: Secondary | ICD-10-CM | POA: Diagnosis not present

## 2014-07-01 MED ORDER — MIRTAZAPINE 15 MG PO TABS: 15.0000 mg | ORAL_TABLET | Freq: Every day | ORAL | Status: DC
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
## 2014-07-01 NOTE — Patient Instructions (Signed)
Patient completed MH-IOP today.  Will follow up with Delrae Alfred, NP on 07-31-14 @ 1pm.  Pt will call Pennelope Bracken, LCSW for appointment.  Encouraged support groups.

## 2014-07-01 NOTE — Progress Notes (Signed)
    Daily Group Progress Note  Program: IOP  Group Time: 9:00-10:30  Participation Level: Active  Behavioral Response: Appropriate  Type of Therapy:  Group Therapy  Summary of Progress: Pt. Reported that she felt "great" on Saturday and had minimal fibromialgia pain and felt "bad" on Sunday with anger that was triggered by family interaction. Pt. Prepared for discharge, discussed stress management techniques and change of job environment.     Group Time: 10:30-12:00  Participation Level:  Active  Behavioral Response: Appropriate  Type of Therapy: Psycho-education Group  Summary of Progress: Pt. Participated in discussion about right/left brain development and developing emotional and sensory awareness. Pt. Watched [employer] video.  Nancie Neas, COUNS

## 2014-07-02 ENCOUNTER — Other Ambulatory Visit (HOSPITAL_COMMUNITY): Payer: PRIVATE HEALTH INSURANCE

## 2014-07-03 ENCOUNTER — Other Ambulatory Visit (HOSPITAL_COMMUNITY): Payer: PRIVATE HEALTH INSURANCE

## 2014-07-04 ENCOUNTER — Other Ambulatory Visit (HOSPITAL_COMMUNITY): Payer: PRIVATE HEALTH INSURANCE

## 2014-07-05 ENCOUNTER — Other Ambulatory Visit (HOSPITAL_COMMUNITY): Payer: PRIVATE HEALTH INSURANCE

## 2014-07-08 ENCOUNTER — Other Ambulatory Visit (HOSPITAL_COMMUNITY): Payer: PRIVATE HEALTH INSURANCE

## 2014-07-09 ENCOUNTER — Other Ambulatory Visit (HOSPITAL_COMMUNITY): Payer: PRIVATE HEALTH INSURANCE

## 2014-07-10 ENCOUNTER — Encounter: Payer: Managed Care, Other (non HMO) | Admitting: Family Medicine

## 2014-07-11 ENCOUNTER — Other Ambulatory Visit (HOSPITAL_COMMUNITY): Payer: PRIVATE HEALTH INSURANCE

## 2014-07-12 ENCOUNTER — Other Ambulatory Visit (HOSPITAL_COMMUNITY): Payer: PRIVATE HEALTH INSURANCE

## 2014-07-15 ENCOUNTER — Other Ambulatory Visit (HOSPITAL_COMMUNITY): Payer: PRIVATE HEALTH INSURANCE

## 2014-07-16 ENCOUNTER — Other Ambulatory Visit (HOSPITAL_COMMUNITY): Payer: PRIVATE HEALTH INSURANCE

## 2014-07-17 ENCOUNTER — Other Ambulatory Visit (HOSPITAL_COMMUNITY): Payer: PRIVATE HEALTH INSURANCE

## 2014-07-18 ENCOUNTER — Other Ambulatory Visit (HOSPITAL_COMMUNITY): Payer: PRIVATE HEALTH INSURANCE

## 2014-07-19 ENCOUNTER — Other Ambulatory Visit (HOSPITAL_COMMUNITY): Payer: PRIVATE HEALTH INSURANCE

## 2014-07-23 ENCOUNTER — Other Ambulatory Visit (HOSPITAL_COMMUNITY): Payer: PRIVATE HEALTH INSURANCE

## 2014-07-24 ENCOUNTER — Other Ambulatory Visit (HOSPITAL_COMMUNITY): Payer: PRIVATE HEALTH INSURANCE

## 2014-07-31 ENCOUNTER — Encounter (HOSPITAL_COMMUNITY): Payer: Self-pay | Admitting: Psychiatry

## 2014-07-31 ENCOUNTER — Ambulatory Visit (INDEPENDENT_AMBULATORY_CARE_PROVIDER_SITE_OTHER): Payer: PRIVATE HEALTH INSURANCE | Admitting: Psychiatry

## 2014-07-31 VITALS — BP 117/81 | HR 67 | Ht 67.0 in | Wt 148.2 lb

## 2014-07-31 DIAGNOSIS — F331 Major depressive disorder, recurrent, moderate: Secondary | ICD-10-CM

## 2014-07-31 MED ORDER — MIRTAZAPINE 15 MG PO TABS: 15.0000 mg | ORAL_TABLET | Freq: Every day | ORAL | Status: DC
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
## 2014-09-30 ENCOUNTER — Emergency Department (HOSPITAL_COMMUNITY)
Admission: EM | Admit: 2014-09-30 | Discharge: 2014-09-30 | Discharge status: Against Medical Advice | Payer: 59 | Attending: Emergency Medicine | Admitting: Emergency Medicine

## 2014-09-30 ENCOUNTER — Encounter (HOSPITAL_COMMUNITY): Payer: Self-pay

## 2014-09-30 DIAGNOSIS — G8929 Other chronic pain: Secondary | ICD-10-CM | POA: Diagnosis not present

## 2014-09-30 DIAGNOSIS — M797 Fibromyalgia: Secondary | ICD-10-CM | POA: Insufficient documentation

## 2014-09-30 DIAGNOSIS — Z720 Tobacco use: Secondary | ICD-10-CM | POA: Diagnosis not present

## 2014-09-30 DIAGNOSIS — Z5321 Procedure and treatment not carried out due to patient leaving prior to being seen by health care provider: Secondary | ICD-10-CM

## 2014-09-30 DIAGNOSIS — J449 Chronic obstructive pulmonary disease, unspecified: Secondary | ICD-10-CM | POA: Insufficient documentation

## 2014-09-30 NOTE — ED Notes (Signed)
Pt from home with fibromyalgia pain.  Pt sts pain is in her lower back and radiates down into her toes.  Took home meds with no relief.  Pt sts pain feels like "fibromyalgia pain".  Pain 7/10.

## 2014-09-30 NOTE — ED Notes (Signed)
Pt. Stated, i'VE BEEN HERE FOR HOUR HALF SO YALL HAVE A BLESSED DAY. Pt. Refused to stay.

## 2014-10-01 ENCOUNTER — Ambulatory Visit (HOSPITAL_COMMUNITY): Payer: Self-pay | Admitting: Psychiatry

## 2014-10-01 NOTE — ED Provider Notes (Signed)
Pt left without being seen by a provider.   Pamella Pert, MD 10/01/14 1154

## 2014-12-28 ENCOUNTER — Other Ambulatory Visit: Payer: Self-pay

## 2015-01-03 ENCOUNTER — Encounter: Payer: Self-pay | Admitting: Medical

## 2015-01-03 ENCOUNTER — Ambulatory Visit (INDEPENDENT_AMBULATORY_CARE_PROVIDER_SITE_OTHER): Payer: 59 | Admitting: Medical

## 2015-01-03 VITALS — BP 128/88 | HR 60 | Temp 97.9°F | Resp 14 | Wt 146.0 lb

## 2015-01-03 DIAGNOSIS — R21 Rash and other nonspecific skin eruption: Secondary | ICD-10-CM

## 2015-01-03 DIAGNOSIS — E038 Other specified hypothyroidism: Secondary | ICD-10-CM

## 2015-01-03 DIAGNOSIS — L299 Pruritus, unspecified: Secondary | ICD-10-CM

## 2015-01-03 MED ORDER — METHYLPREDNISOLONE (PAK) 4 MG PO TABS: ORAL_TABLET | ORAL | Status: DC

## 2015-01-03 NOTE — Progress Notes (Signed)
Subjective: Here for concerns about thyroid.  She notes face is breaking out, scalp is very itchy.   Started 3 weeks ago.  Skin peeling some.   Has chronic headaches in general, sometimes palpitations, but this is chronic, nothing new.   Using some benadryl.   Never has had face break out ever.  Doesn't usually have winter allergy problems.  had eyelid infection few 3weeks ago, was on tobramycin.  No other aggravating or relieving factors. No other complaint.   Past Medical History  Diagnosis Date  . COPD (chronic obstructive pulmonary disease)   . GERD (gastroesophageal reflux disease)   . Sinus bradycardia   . Hypothyroidism   . Hematuria     microscopic hematuria (urol & nephrol w/u neg), multiple UTI`s  . Fibromyalgia   . Carpal tunnel syndrome     right wrist  . Hyperlipidemia     managed by Dr. Ouida Sills  . Microscopic hematuria     glomerular from sickle cell trait (per Dr. Jeffie Pollock 02/2013)  . Pneumonia     "abuntantly as a child"  . Shortness of breath     "at rest" (12/05/2013)  . Migraine     "q 3-4 months" (12/05/2013)  . Chronic back pain   . Osteoarthritis     Rheumatoid and OA.  sees Dr. Ouida Sills [facility] medical, rheumatology)  . Rheumatoid arthritis   . Depression 2006    "after my son passed" (12/05/2013)  . Thyroid cancer     S/P radiation & OR  . Abnormality of gait 03/19/2014   ROS as in subjective   Objective: BP 128/88 mmHg  Pulse 60  Temp(Src) 97.9 F (36.6 C) (Oral)  Resp 14  Wt 146 lb (66.225 kg)  Gen: wd, wn,nad Skin: Itchy irritated skin of face throughout, eyelid seem a little swollen bilaterally, no conjunctival injection, no obvious high urticarial lesion Lungs clear RRR, normal S1, S2, no murmurs No edema Pulses normal Neck: Anterior surgical scar, supple, nontender, no lymphadenopathy, no thyromegaly or mass   Assessment: Encounter Diagnoses  Name Primary?  . Other specified hypothyroidism Yes  . Rash and nonspecific skin eruption   .  Pruritic condition     Plan: Hypothyroidism - labs today Rash, itching - possibly environmental allergy vs localized allergy to tobramycin vs other .  Advised medrol Dosepak, discussed the skin benefits of medication,Benadryl over-the-counter daily for the next 1-2 weeks, recheck and not improving in the next week  She has questions about a form she left with Dr. Tomi Bamberger to fill out related to employment. Would like me to look into this

## 2015-01-04 LAB — T4, FREE: Free T4: 1.49 ng/dL (ref 0.80–1.80)

## 2015-01-04 LAB — TSH: TSH: 8.668 u[IU]/mL — AB (ref 0.350–4.500)

## 2015-01-07 ENCOUNTER — Ambulatory Visit: Payer: 59 | Admitting: Medical

## 2015-03-04 ENCOUNTER — Emergency Department (HOSPITAL_COMMUNITY)
Admission: EM | Admit: 2015-03-04 | Discharge: 2015-03-04 | Discharge status: Absent without leave | Payer: 59 | Source: Home / Self Care

## 2015-03-27 ENCOUNTER — Other Ambulatory Visit: Payer: Self-pay | Admitting: Family Medicine

## 2015-03-27 NOTE — Telephone Encounter (Signed)
Advised patient she needs office visit within next 30 days per [facility]

## 2015-04-16 ENCOUNTER — Ambulatory Visit (INDEPENDENT_AMBULATORY_CARE_PROVIDER_SITE_OTHER): Payer: 59 | Admitting: Medical

## 2015-04-16 ENCOUNTER — Encounter: Payer: Self-pay | Admitting: Medical

## 2015-04-16 VITALS — BP 120/80 | HR 60 | Temp 97.9°F | Resp 15 | Wt 143.0 lb

## 2015-04-16 DIAGNOSIS — M797 Fibromyalgia: Secondary | ICD-10-CM | POA: Diagnosis not present

## 2015-04-16 DIAGNOSIS — M79602 Pain in left arm: Secondary | ICD-10-CM | POA: Diagnosis not present

## 2015-04-16 DIAGNOSIS — E038 Other specified hypothyroidism: Secondary | ICD-10-CM

## 2015-04-16 LAB — TSH: TSH: 33.599 u[IU]/mL — ABNORMAL HIGH (ref 0.350–4.500)

## 2015-04-16 LAB — T4, FREE: Free T4: 1.12 ng/dL (ref 0.80–1.80)

## 2015-04-16 MED ORDER — DULOXETINE HCL 30 MG PO CPEP: 30.0000 mg | ORAL_CAPSULE | Freq: Every day | ORAL | Status: DC

## 2015-04-16 NOTE — Progress Notes (Signed)
Subjective Here for left arm pain.   Been having some left arm pain of recent.  Hurts down most of the left arm, gets tingling at times.  Maybe some mild shoulder pain on left, no neck pain, no change in ROM of neck or shoulder.  Does have hx/o fibromyalgia, not sure if the pain represents fibromyalgia vs neck or arm issues.  Doesn't sleep well due to left arm pain.  Has been seeing Dr. Tobie Lords Rheumatology for years.  Has been on Lyrica prior with improvement but this was expensive.   Uses ultram every 6 hours in general.  No prior gabapentin or Cymbalta.   Uses NSAID daily.   Mood is usually good, but anniversary of her son's death is 2023-02-17, so every 02-17-23 gets down, is irritable and this is a painful time of year, particularly around [place].     Here for recheck on thyroid medication without c/o.  Objective: BP 120/80 mmHg  Pulse 60  Temp(Src) 97.9 F (36.6 C) (Oral)  Resp 15  Wt 143 lb (64.864 kg)  General appearance: alert, no distress, WD/WN Neck: supple, nontender, relatively normal ROM, no lymphadenopathy, no thyromegaly, no masses MSK: tender throughout upper arm, mild generalized shoulder tenderness, mild pain with left shoulder ROM in general, no deformity, rest of arm exam unremarkable Back: tender throughout paraspinal muscles upper mid and lower Arms neurovascularly intact   Assessment: Encounter Diagnoses  Name Primary?  . Other specified hypothyroidism Yes  . Fibromyalgia   . Left arm pain     Plan: Hypothyroidism - labs today, c/t same medication  fibromyalgia - begin trial of Cymbalta, discussed risks/benefits .  She has used Lyrica prior with improvements, but couldn't afford this. Recheck 38mo.  Left arm pain - fibromyalgia vs  Cervical radiculopathy.   reviewed 16-Feb-2010 MRI C spine.  Begin Cymbalta, can use NSAID, stretching, and recheck 51mo

## 2015-04-17 ENCOUNTER — Other Ambulatory Visit: Payer: Self-pay | Admitting: Medical

## 2015-04-17 MED ORDER — LEVOTHYROXINE SODIUM 150 MCG PO TABS: 150.0000 ug | ORAL_TABLET | Freq: Every day | ORAL | Status: DC

## 2015-07-01 ENCOUNTER — Encounter: Payer: Self-pay | Admitting: Medical

## 2015-07-01 ENCOUNTER — Encounter (INDEPENDENT_AMBULATORY_CARE_PROVIDER_SITE_OTHER): Payer: 59 | Admitting: Medical

## 2015-07-01 DIAGNOSIS — Z139 Encounter for screening, unspecified: Secondary | ICD-10-CM

## 2015-07-02 NOTE — Progress Notes (Signed)
error 

## 2015-08-04 ENCOUNTER — Encounter: Payer: Self-pay | Admitting: Medical

## 2015-08-04 ENCOUNTER — Ambulatory Visit (INDEPENDENT_AMBULATORY_CARE_PROVIDER_SITE_OTHER): Payer: 59 | Admitting: Medical

## 2015-08-04 VITALS — BP 138/88 | HR 58 | Temp 97.8°F | Resp 18 | Wt 142.2 lb

## 2015-08-04 DIAGNOSIS — Z85850 Personal history of malignant neoplasm of thyroid: Secondary | ICD-10-CM | POA: Diagnosis not present

## 2015-08-04 DIAGNOSIS — Z23 Encounter for immunization: Secondary | ICD-10-CM | POA: Diagnosis not present

## 2015-08-04 DIAGNOSIS — E038 Other specified hypothyroidism: Secondary | ICD-10-CM | POA: Diagnosis not present

## 2015-08-04 DIAGNOSIS — Z720 Tobacco use: Secondary | ICD-10-CM | POA: Diagnosis not present

## 2015-08-04 DIAGNOSIS — F17200 Nicotine dependence, unspecified, uncomplicated: Secondary | ICD-10-CM

## 2015-08-04 LAB — COMPREHENSIVE METABOLIC PANEL
ALBUMIN: 4.1 g/dL (ref 3.6–5.1)
ALT: 8 U/L (ref 6–29)
AST: 15 U/L (ref 10–35)
Alkaline Phosphatase: 91 U/L (ref 33–130)
BILIRUBIN TOTAL: 0.3 mg/dL (ref 0.2–1.2)
BUN: 6 mg/dL — ABNORMAL LOW (ref 7–25)
CALCIUM: 9 mg/dL (ref 8.6–10.4)
CHLORIDE: 103 mmol/L (ref 98–110)
CO2: 28 mmol/L (ref 20–31)
Creat: 0.79 mg/dL (ref 0.50–1.05)
GLUCOSE: 83 mg/dL (ref 65–99)
Potassium: 4 mmol/L (ref 3.5–5.3)
SODIUM: 140 mmol/L (ref 135–146)
Total Protein: 7 g/dL (ref 6.1–8.1)

## 2015-08-04 LAB — CBC
HCT: 37.8 % (ref 36.0–46.0)
Hemoglobin: 12.9 g/dL (ref 12.0–15.0)
MCH: 27.6 pg (ref 26.0–34.0)
MCHC: 34.1 g/dL (ref 30.0–36.0)
MCV: 80.8 fL (ref 78.0–100.0)
MPV: 9.6 fL (ref 8.6–12.4)
Platelets: 274 10*3/uL (ref 150–400)
RBC: 4.68 MIL/uL (ref 3.87–5.11)
RDW: 14 % (ref 11.5–15.5)
WBC: 4.1 10*3/uL (ref 4.0–10.5)

## 2015-08-04 LAB — LIPID PANEL
Cholesterol: 280 mg/dL — ABNORMAL HIGH (ref 125–200)
HDL: 55 mg/dL (ref >=46)
LDL CALC: 207 mg/dL — AB (ref <130)
Total CHOL/HDL Ratio: 5.1 Ratio — ABNORMAL HIGH (ref <=5.0)
Triglycerides: 89 mg/dL (ref <150)
VLDL: 18 mg/dL (ref <30)

## 2015-08-04 LAB — TSH: TSH: 72.078 u[IU]/mL — AB (ref 0.350–4.500)

## 2015-08-04 LAB — T4, FREE: Free T4: 0.61 ng/dL — ABNORMAL LOW (ref 0.80–1.80)

## 2015-08-04 NOTE — Progress Notes (Signed)
   Subjective: Chief Complaint  Patient presents with  . Follow-up   Here for med check.    Hypothyroidism and hx/o thyroid cancer - doing fine on current meds except she lost her medication last week, so been out a few days.  Feels like dose has been working fine.    Still smokes, no interest in quitting.  Has mammogram in a few weeks, will return in 11/2015 for physical.  No other c/o.  Past Medical History  Diagnosis Date  . COPD (chronic obstructive pulmonary disease)   . GERD (gastroesophageal reflux disease)   . Sinus bradycardia   . Hypothyroidism   . Hematuria     microscopic hematuria (urol & nephrol w/u neg), multiple UTI`s, glomerular from Sickle Trait, Dr. Jeffie Pollock 2014  . Fibromyalgia   . Carpal tunnel syndrome     right wrist  . Hyperlipidemia     managed by Dr. Ouida Sills  . Pneumonia     "abuntantly as a child"  . Shortness of breath     "at rest" (12/05/2013)  . Migraine     "q 3-4 months" (12/05/2013)  . Chronic back pain   . Depression 2006    "after my son passed" (12/05/2013)  . Thyroid cancer     S/P radiation & OR  . Abnormality of gait 03/19/2014  . Osteoarthritis     Rheumatoid and OA?  sees Dr. Ouida Sills [facility] medical, rheumatology)  . Rheumatoid arthritis     ROS as in subjective  Objective: BP 138/88 mmHg  Pulse 58  Temp(Src) 97.8 F (36.6 C) (Oral)  Resp 18  Wt 142 lb 3.2 oz (64.501 kg)  Gen: wd, wn, nad, lean AA female Neck: anterior lower surgical scar, no mass, no lymphadenopathy, no thyromegaly Lungs clear Heart RRR, normal s1, s2, no mururs Ext: no edema Pulses 2+ Neuro: CN2-12 intact, DTRs normal   Assessment: Encounter Diagnoses  Name Primary?  . Other specified hypothyroidism Yes  . History of thyroid cancer   . Smoker   . Need for prophylactic vaccination and inoculation against influenza     Plan: Hypothyroidism - labs today, c/t same medication Hx/o thyroid cancer - labs today Smoker - advised cessation but she is  not interested in quitting Counseled on the influenza virus vaccine.  Vaccine information sheet given.  Influenza vaccine given after consent obtained.

## 2015-08-05 ENCOUNTER — Other Ambulatory Visit: Payer: Self-pay | Admitting: Medical

## 2015-08-05 MED ORDER — ATORVASTATIN CALCIUM 20 MG PO TABS: 20.0000 mg | ORAL_TABLET | Freq: Every day | ORAL | Status: DC

## 2015-08-05 MED ORDER — LEVOTHYROXINE SODIUM 150 MCG PO TABS: 150.0000 ug | ORAL_TABLET | Freq: Every day | ORAL | Status: DC

## 2015-08-05 NOTE — Progress Notes (Signed)
Unable to call patient with results, she does not have a phone number. Mailed results.

## 2015-09-15 LAB — HM MAMMOGRAPHY
HM Mammogram: NEGATIVE
HM Mammogram: NEGATIVE

## 2015-10-01 ENCOUNTER — Encounter: Payer: Self-pay | Admitting: Medical

## 2015-10-06 ENCOUNTER — Encounter: Payer: Self-pay | Admitting: Medical

## 2015-11-10 ENCOUNTER — Emergency Department (HOSPITAL_COMMUNITY): Payer: 59

## 2015-11-10 ENCOUNTER — Emergency Department (HOSPITAL_COMMUNITY)
Admission: EM | Admit: 2015-11-10 | Discharge: 2015-11-11 | Disposition: A | Discharge status: Home / Self Care | Payer: 59 | Attending: Emergency Medicine | Admitting: Emergency Medicine

## 2015-11-10 ENCOUNTER — Encounter (HOSPITAL_COMMUNITY): Payer: Self-pay | Admitting: Emergency Medicine

## 2015-11-10 DIAGNOSIS — Z8719 Personal history of other diseases of the digestive system: Secondary | ICD-10-CM | POA: Insufficient documentation

## 2015-11-10 DIAGNOSIS — G8929 Other chronic pain: Secondary | ICD-10-CM | POA: Insufficient documentation

## 2015-11-10 DIAGNOSIS — F17210 Nicotine dependence, cigarettes, uncomplicated: Secondary | ICD-10-CM | POA: Diagnosis not present

## 2015-11-10 DIAGNOSIS — H5500 Unspecified nystagmus: Secondary | ICD-10-CM | POA: Diagnosis not present

## 2015-11-10 DIAGNOSIS — Z791 Long term (current) use of non-steroidal anti-inflammatories (NSAID): Secondary | ICD-10-CM | POA: Insufficient documentation

## 2015-11-10 DIAGNOSIS — F329 Major depressive disorder, single episode, unspecified: Secondary | ICD-10-CM | POA: Insufficient documentation

## 2015-11-10 DIAGNOSIS — R001 Bradycardia, unspecified: Secondary | ICD-10-CM | POA: Insufficient documentation

## 2015-11-10 DIAGNOSIS — J449 Chronic obstructive pulmonary disease, unspecified: Secondary | ICD-10-CM | POA: Insufficient documentation

## 2015-11-10 DIAGNOSIS — M069 Rheumatoid arthritis, unspecified: Secondary | ICD-10-CM | POA: Insufficient documentation

## 2015-11-10 DIAGNOSIS — G43909 Migraine, unspecified, not intractable, without status migrainosus: Secondary | ICD-10-CM | POA: Diagnosis not present

## 2015-11-10 DIAGNOSIS — M797 Fibromyalgia: Secondary | ICD-10-CM | POA: Insufficient documentation

## 2015-11-10 DIAGNOSIS — R55 Syncope and collapse: Secondary | ICD-10-CM

## 2015-11-10 DIAGNOSIS — E785 Hyperlipidemia, unspecified: Secondary | ICD-10-CM | POA: Diagnosis not present

## 2015-11-10 DIAGNOSIS — Z79899 Other long term (current) drug therapy: Secondary | ICD-10-CM | POA: Diagnosis not present

## 2015-11-10 DIAGNOSIS — R079 Chest pain, unspecified: Secondary | ICD-10-CM | POA: Diagnosis not present

## 2015-11-10 DIAGNOSIS — Z8701 Personal history of pneumonia (recurrent): Secondary | ICD-10-CM | POA: Insufficient documentation

## 2015-11-10 DIAGNOSIS — E039 Hypothyroidism, unspecified: Secondary | ICD-10-CM

## 2015-11-10 LAB — CBC
HCT: 36.7 % (ref 36.0–46.0)
Hemoglobin: 12.5 g/dL (ref 12.0–15.0)
MCH: 27.2 pg (ref 26.0–34.0)
MCHC: 34.1 g/dL (ref 30.0–36.0)
MCV: 79.8 fL (ref 78.0–100.0)
Platelets: 289 10*3/uL (ref 150–400)
RBC: 4.6 MIL/uL (ref 3.87–5.11)
RDW: 14 % (ref 11.5–15.5)
WBC: 6.6 10*3/uL (ref 4.0–10.5)

## 2015-11-10 LAB — BASIC METABOLIC PANEL
Anion gap: 9 (ref 5–15)
BUN: 11 mg/dL (ref 6–20)
CALCIUM: 9.3 mg/dL (ref 8.9–10.3)
CO2: 26 mmol/L (ref 22–32)
CREATININE: 0.83 mg/dL (ref 0.44–1.00)
Chloride: 107 mmol/L (ref 101–111)
GFR calc Af Amer: >60 mL/min (ref >60)
GLUCOSE: 93 mg/dL (ref 65–99)
Potassium: 3.5 mmol/L (ref 3.5–5.1)
Sodium: 142 mmol/L (ref 135–145)

## 2015-11-10 LAB — I-STAT TROPONIN, ED: Troponin i, poc: 0 ng/mL (ref 0.00–0.08)

## 2015-11-10 NOTE — ED Provider Notes (Signed)
CSN: [id number]     Arrival date & time 11/10/15  2019 History  By signing my name below, I, Emmanuella Mensah, attest that this documentation has been prepared under the direction and in the presence of Davonna Belling, MD. Electronically Signed: Judithann Sauger, ED Scribe. 11/10/2015. 11:44 PM.    Chief Complaint  Patient presents with  . Near Syncope   The history is provided by the patient. No language interpreter was used.   HPI Comments: Emma Wilson is a 59 y.o. female with a hx of vertigo and fibromyalgia who presents to the Emergency Department complaining of a resolved "uncomfortable" constant left sided CP and lightheadedness s/p a near syncope episode that occurred PTA. Pt explains that she has been experiencing lightheadedness and dizziness all day at home so she called the nurse line and the nurse asked her if she had neck pain but she answered that she was fine attributing the pain to sleeping on it wrong. She states that she is here because she was experiencing CP and the nurse told her to get evaluated. She reports that she has vertigo approx twice a year but these symptoms are not consistent to those episodes because normally she feels as though the room is pinning. She denies any numbness/tingling in arms, fever, chills, or any other acute pain.     Past Medical History  Diagnosis Date  . COPD (chronic obstructive pulmonary disease) ([place])   . GERD (gastroesophageal reflux disease)   . Sinus bradycardia   . Hypothyroidism   . Hematuria     microscopic hematuria (urol & nephrol w/u neg), multiple UTI`s, glomerular from Sickle Trait, Dr. Jeffie Pollock 2014  . Fibromyalgia   . Carpal tunnel syndrome     right wrist  . Hyperlipidemia     managed by Dr. Ouida Sills  . Pneumonia     "abuntantly as a child"  . Shortness of breath     "at rest" (12/05/2013)  . Migraine     "q 3-4 months" (12/05/2013)  . Chronic back pain   . Depression 2006    "after my son passed" (12/05/2013)   . Thyroid cancer ([place])     S/P radiation & OR  . Abnormality of gait 03/19/2014  . Osteoarthritis     Rheumatoid and OA?  sees Dr. Ouida Sills [facility] medical, rheumatology)  . Rheumatoid arthritis [facility])    Past Surgical History  Procedure Laterality Date  . Thyroidectomy, partial  11/2005; 11/2006  . Cystoscopy  02/26/2013    Dr. Jeffie Pollock (normal; follicular cystitis)  . Tubal ligation  1980's  . Total abdominal hysterectomy  ~ 1990  . Colonoscopy  10/13/2010    external and internal hemorrhoids, repeat 2021; Dr. Clarene Essex   Family History  Problem Relation Age of Onset  . Stroke Mother   . Coronary artery disease Mother   . Kidney disease Mother     on dialysis  . Cancer Father     bone cancer  . Arthritis Sister     rheumatoid and osteoarthritis  . Arthritis Sister     RA and OA  . Diabetes Son    Social History  Substance Use Topics  . Smoking status: Current Every Day Smoker -- 1.00 packs/day for 34 years    Types: Cigarettes  . Smokeless tobacco: Never Used  . Alcohol Use: Yes     Comment: once to twice a week    OB History    No data available     Review  of Systems  Constitutional: Negative for fever and chills.  Cardiovascular: Positive for chest pain.  Neurological: Positive for dizziness and light-headedness. Negative for numbness.  All other systems reviewed and are negative.     Allergies  Review of patient's allergies indicates no known allergies.  Home Medications   Prior to Admission medications   Medication Sig Start Date End Date Taking? Authorizing Provider  Acetaminophen (TYLENOL ARTHRITIS EXT RELIEF PO) Take 1 tablet by mouth every 4 (four) hours as needed (pain).   Yes Historical Provider, MD  Ascorbic Acid (VITAMIN C) 500 MG tablet Take 500 mg by mouth daily.    Yes Historical Provider, MD  cholecalciferol (VITAMIN D) 1000 UNITS tablet Take 1,000 Units by mouth daily.   Yes Historical Provider, MD  diclofenac sodium (VOLTAREN) 1 % GEL Apply 1  application topically 4 (four) times daily.   Yes Historical Provider, MD  DULoxetine (CYMBALTA) 30 MG capsule Take 1 capsule (30 mg total) by mouth daily. 04/16/15  Yes Camelia Eng Tysinger, PA-C  levothyroxine (SYNTHROID) 150 MCG tablet Take 1 tablet (150 mcg total) by mouth daily before breakfast. 08/05/15  Yes Camelia Eng Tysinger, PA-C  mirtazapine (REMERON) 15 MG tablet Take 15 mg by mouth at bedtime.   Yes Historical Provider, MD  Multiple Vitamin (MULTIVITAMIN) tablet Take 1 tablet by mouth daily.   Yes Historical Provider, MD  naproxen (NAPROSYN) 500 MG tablet Take 500 mg by mouth every 12 (twelve) hours.   Yes Historical Provider, MD  traMADol (ULTRAM) 50 MG tablet Take 50 mg by mouth 4 (four) times daily.    Yes Historical Provider, MD  atorvastatin (LIPITOR) 20 MG tablet Take 1 tablet (20 mg total) by mouth daily. Patient not taking: Reported on 11/10/2015 08/05/15   Camelia Eng Tysinger, PA-C   BP 139/89 mmHg  Pulse 55  Temp(Src) 98.3 F (36.8 C) (Oral)  Resp 26  SpO2 98% Physical Exam  Constitutional: She is oriented to person, place, and time. She appears well-developed and well-nourished. No distress.  HENT:  Head: Normocephalic and atraumatic.  Eyes: Conjunctivae are normal. Right eye exhibits nystagmus. Left eye exhibits nystagmus.  Mild end gaze nystagmus with gaze to left and right. Eye movements intact  Neck: Neck supple. No tracheal deviation present.  Cardiovascular: Normal rate.   Pulmonary/Chest: Effort normal. No respiratory distress.  Lungs clear  Abdominal: Soft. There is no tenderness.  Musculoskeletal: Normal range of motion. She exhibits no edema.  No peripheral edema  Neurological: She is alert and oriented to person, place, and time.  Skin: Skin is warm and dry.  Psychiatric: She has a normal mood and affect. Her behavior is normal.  Nursing note and vitals reviewed.   ED Course  Procedures (including critical care time) DIAGNOSTIC STUDIES: Oxygen Saturation is  98% on RA, normal by my interpretation.    COORDINATION OF CARE: 11:39 PM- Pt advised of plan for treatment and pt agrees. Pt will receive chest x-ray and lab work for further evaluations.    Labs Review Labs Reviewed  BASIC METABOLIC PANEL  CBC  I-STAT [place], ED    Imaging Review Dg Chest 2 View  11/10/2015  CLINICAL DATA:  Chest congestion and chest heaviness EXAM: CHEST  2 VIEW COMPARISON:  04/30/2014 FINDINGS: The heart size and mediastinal contours are within normal limits. Both lungs are clear. The visualized skeletal structures are unremarkable. IMPRESSION: No active cardiopulmonary disease. Electronically Signed   By: Franchot Gallo M.D.   On: 11/10/2015 21:33  Davonna Belling, MD has personally reviewed and evaluated these images and lab results as part of his medical decision-making.   EKG Interpretation   Date/Time:  Monday November 10 2015 20:59:07 EST Ventricular Rate:  56 PR Interval:  184 QRS Duration: 87 QT Interval:  425 QTC Calculation: 410 R Axis:   60 Text Interpretation:  Sinus rhythm Probable left atrial enlargement  Abnormal R-wave progression, early transition Nonspecific T abnormalities,  anterior leads No significant change since last tracing Confirmed by  Alvino Chapel  MD, Ovid Curd [phone]) on 11/10/2015 11:31:39 PM      MDM   Final diagnoses:  Near syncope  Sinus bradycardia  Hypothyroidism, unspecified hypothyroidism type    Patient with near-syncope. Has slightly worsened bradycardia but has not seen pauses while monitoring. Maintain blood pressure. She has been noncompliant with her Synthroid. Patient states she'll start taking it. Will discharge home.  I personally performed the services described in this documentation, which was scribed in my presence. The recorded information has been reviewed and is accurate.     Davonna Belling, MD 11/11/15 [phone]

## 2015-11-10 NOTE — ED Notes (Signed)
Pt states she was feeling light headed and dizzy at home so she called the nurse line and was talking to her  Pt states she has been having pain to the right side of her neck and shoulder area  Pt states she has had some congestion and sinus problems lately and has had a slight heaviness in her chest  Pt states the nurse wanted her to call EMS and come here but pt states she chose to drive herself in

## 2015-11-10 NOTE — ED Notes (Signed)
Delay in lab draw,  Pt in exray 

## 2015-11-11 NOTE — Discharge Instructions (Signed)
take your medications as directed.  Bradycardia Bradycardia is a slower-than-normal heart rate. A normal resting heart rate for an adult ranges from 60 to 100 beats per minute. With bradycardia, the resting heart rate is less than 60 beats per minute. Bradycardia is a problem if your heart cannot pump enough oxygen-rich blood through your body. Bradycardia is not a problem for everyone. For some healthy adults, a slow resting heart rate is normal.  CAUSES  Bradycardia may be caused by:  A problem with the heart's electrical system, such as heart block.  A problem with the heart's natural pacemaker (sinus node).  Heart disease, damage, or infection.  Certain medicines that treat heart conditions.  Certain conditions, such as hypothyroidism and obstructive sleep apnea. RISK FACTORS  Risk factors include:  Being 30 or older.  Having high blood pressure (hypertension), high cholesterol (hyperlipidemia), or diabetes.  Drinking heavily, using tobacco products, or using drugs.  Being stressed. SIGNS AND SYMPTOMS  Signs and symptoms include:  Light-headedness.  Faintingor near fainting.  Fatigue and weakness.  Shortness of breath.  Chest pain (angina).  Drowsiness.  Confusion.  Dizziness. DIAGNOSIS  Diagnosis of bradycardia may include:  A physical exam.  An electrocardiogram (ECG).  Blood tests. TREATMENT  Treatment for bradycardia may include:  Treatment of an underlying condition.  Pacemaker placement. A pacemaker is a small, battery-powered device that is placed under the skin and is programmed to sense your heartbeats. If your heart rate is lower than the programmed rate, the pacemaker will pace your heart.  Changing your medicines or dosages. HOME CARE INSTRUCTIONS  Take medicines only as directed by your health care provider.  Manage any health conditions that contribute to bradycardia as directed by your health care provider.  Follow a  heart-healthy diet. A dietitian can help educate you on healthy food options and changes.  Follow an exercise program approved by your health care provider.  Maintain a healthy weight. Lose weight as approved by your health care provider.  Do not use tobacco products, including cigarettes, chewing tobacco, or electronic cigarettes. If you need help quitting, ask your health care provider.  Do not use illegal drugs.  Limit alcohol intake to no more than 1 drink per day for nonpregnant women and 2 drinks per day for men. One drink equals 12 ounces of beer, 5 ounces of wine, or 1 ounces of hard liquor.  Keep all follow-up visits as directed by your health care provider. This is important. SEEK MEDICAL CARE IF:  You feel light-headed or dizzy.  You almost faint.  You feel weak or are easily fatigued during physical activity.  You experience confusion or have memory problems. SEEK IMMEDIATE MEDICAL CARE IF:   You faint.  You have an irregular heartbeat.  You have chest pain.  You have trouble breathing. MAKE SURE YOU:   Understand these instructions.  Will watch your condition.  Will get help right away if you are not doing well or get worse.   This information is not intended to replace advice given to you by your health care provider. Make sure you discuss any questions you have with your health care provider.   Document Released: 07/24/2002 Document Revised: 11/22/2014 Document Reviewed: 02/06/2014 Elsevier Interactive Patient Education [employer].

## 2015-11-13 ENCOUNTER — Telehealth: Payer: Self-pay | Admitting: Medical

## 2015-11-13 NOTE — Telephone Encounter (Signed)
LMTCB

## 2015-11-13 NOTE — Telephone Encounter (Signed)
She was seen recently at the hospital.   Lets schedule a f/u visit regarding this.

## 2015-11-18 NOTE — Telephone Encounter (Signed)
LMTCB

## 2015-11-18 NOTE — Telephone Encounter (Signed)
Pt called and scheduled appt.

## 2015-11-28 ENCOUNTER — Encounter: Payer: Self-pay | Admitting: Cardiology

## 2015-11-28 ENCOUNTER — Ambulatory Visit (INDEPENDENT_AMBULATORY_CARE_PROVIDER_SITE_OTHER): Payer: 59 | Admitting: Cardiology

## 2015-11-28 VITALS — BP 122/80 | HR 61 | Ht 67.0 in | Wt 142.0 lb

## 2015-11-28 DIAGNOSIS — R079 Chest pain, unspecified: Secondary | ICD-10-CM

## 2015-11-28 DIAGNOSIS — E7800 Pure hypercholesterolemia, unspecified: Secondary | ICD-10-CM | POA: Diagnosis not present

## 2015-11-28 DIAGNOSIS — R072 Precordial pain: Secondary | ICD-10-CM

## 2015-11-28 DIAGNOSIS — F17200 Nicotine dependence, unspecified, uncomplicated: Secondary | ICD-10-CM | POA: Diagnosis not present

## 2015-11-28 DIAGNOSIS — I495 Sick sinus syndrome: Secondary | ICD-10-CM

## 2015-11-28 NOTE — Assessment & Plan Note (Signed)
Continue statin. 

## 2015-11-28 NOTE — Patient Instructions (Signed)
Your physician recommends that you schedule a follow-up appointment in: AS NEEDED  

## 2015-11-28 NOTE — Assessment & Plan Note (Signed)
The patient has had some sinus bradycardia in the pastBut presently heart rate in the 60s. No further evaluation at this point.

## 2015-11-28 NOTE — Assessment & Plan Note (Signed)
Patient counseled on discontinuing. 

## 2015-11-28 NOTE — Progress Notes (Signed)
HPI: FU CP. Stress echocardiogram in January of 2010 was normal. Myoview August of 2011 revealed EF of 53 and no ischemia. Echocardiogram January 2015 showed normal LV function and grade 1 diastolic dysfunction. Exercise treadmill March 2015 normal. Patient seen in the emergency room December 26 with chest pain and near syncope. Hemoglobin 12.5, troponin negative and chest x-ray with no infiltrate. Patient states that she will have occasional pain in the left axillary area. It occurs both with exertion and at rest. Last several minutes and resolved spontaneously. No associated symptoms. Denies dyspnea or syncope. Thinks it may be related to fibromyalgia.  Current Outpatient Prescriptions  Medication Sig Dispense Refill  . Acetaminophen (TYLENOL ARTHRITIS EXT RELIEF PO) Take 1 tablet by mouth every 4 (four) hours as needed (pain).    . Ascorbic Acid (VITAMIN C) 500 MG tablet Take 500 mg by mouth daily.     Marland Kitchen atorvastatin (LIPITOR) 20 MG tablet Take 1 tablet (20 mg total) by mouth daily. 90 tablet 1  . cholecalciferol (VITAMIN D) 1000 UNITS tablet Take 1,000 Units by mouth daily.    . diclofenac sodium (VOLTAREN) 1 % GEL Apply 1 application topically 4 (four) times daily.    Marland Kitchen levothyroxine (SYNTHROID) 150 MCG tablet Take 1 tablet (150 mcg total) by mouth daily before breakfast. 90 tablet 3  . mirtazapine (REMERON) 15 MG tablet Take 15 mg by mouth at bedtime.    . Multiple Vitamin (MULTIVITAMIN) tablet Take 1 tablet by mouth daily.    . naproxen (NAPROSYN) 500 MG tablet Take 500 mg by mouth every 12 (twelve) hours.    . traMADol (ULTRAM) 50 MG tablet Take 50 mg by mouth 4 (four) times daily.      No current facility-administered medications for this visit.     Past Medical History  Diagnosis Date  . COPD (chronic obstructive pulmonary disease) ([place])   . GERD (gastroesophageal reflux disease)   . Sinus bradycardia   . Hypothyroidism   . Hematuria     microscopic hematuria (urol &  nephrol w/u neg), multiple UTI`s, glomerular from [facility], Dr. Jeffie Pollock 2014  . Fibromyalgia   . Carpal tunnel syndrome     right wrist  . Hyperlipidemia     managed by Dr. Ouida Sills  . Pneumonia     "abuntantly as a child"  . Shortness of breath     "at rest" (12/05/2013)  . Migraine     "q 3-4 months" (12/05/2013)  . Chronic back pain   . Depression 2006    "after my son passed" (12/05/2013)  . Thyroid cancer ([place])     S/P radiation & OR  . Abnormality of gait 03/19/2014  . Osteoarthritis     Rheumatoid and OA?  sees Dr. Ouida Sills [facility], rheumatology)  . Rheumatoid arthritis [facility])     Past Surgical History  Procedure Laterality Date  . Thyroidectomy, partial  11/2005; 11/2006  . Cystoscopy  02/26/2013    Dr. Jeffie Pollock (normal; follicular cystitis)  . Tubal ligation  1980's  . Total abdominal hysterectomy  ~ 1990  . Colonoscopy  10/13/2010    external and internal hemorrhoids, repeat 2021; Dr. Clarene Essex    Social History   Social History  . Marital Status: Divorced    Spouse Name: N/A  . Number of Children: 3  . 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 Smoking status: Current  Every Day Smoker -- 1.00 packs/day for 34 years    Types: Cigarettes  . Smokeless tobacco: Never Used  . Alcohol Use: Yes     Comment: once to twice a week   . Drug Use: No  . Sexual Activity:    Partners: Male   Other Topics Concern  . Not on file   Social History Narrative   Lives alone.  2 living children (1 deceased), 1 in [place], 1 in [place], 6 grandchildren    Family History  Problem Relation Age of Onset  . Stroke Mother   . Coronary artery disease Mother   . Kidney disease Mother     on dialysis  . Cancer Father     bone cancer  . Arthritis Sister     rheumatoid and osteoarthritis  . Arthritis Sister     RA and OA  . Diabetes Son     ROS: no fevers or chills, productive cough, hemoptysis,  dysphasia, odynophagia, melena, hematochezia, dysuria, hematuria, rash, seizure activity, orthopnea, PND, pedal edema, claudication. Remaining systems are negative.  Physical Exam: Well-developed well-nourished in no acute distress.  Skin is warm and dry.  HEENT is normal.  Neck is supple.  Chest is clear to auscultation with normal expansion.  Cardiovascular exam is regular rate and rhythm.  Abdominal exam nontender or distended. No masses palpated. Extremities show no edema. neuro grossly intact  ECG 11/10/2015-sinus rhythm with nonspecific ST changes.  Today's electrocardiogram shows sinus rhythm with normal axis. Occasional PVC. No ST changes.

## 2015-11-28 NOTE — Assessment & Plan Note (Signed)
Symptoms areExtremely atypical and chronic. I do not think they're cardiac related. She has had previous functional studies that are negative. We will not pursue further ischemia evaluation at this point.

## 2015-12-02 ENCOUNTER — Encounter (HOSPITAL_COMMUNITY): Payer: Self-pay | Admitting: Emergency Medicine

## 2015-12-02 ENCOUNTER — Emergency Department (HOSPITAL_COMMUNITY): Payer: 59

## 2015-12-02 ENCOUNTER — Emergency Department (HOSPITAL_COMMUNITY)
Admission: EM | Admit: 2015-12-02 | Discharge: 2015-12-02 | Discharge status: Against Medical Advice | Payer: 59 | Attending: Emergency Medicine | Admitting: Emergency Medicine

## 2015-12-02 DIAGNOSIS — J449 Chronic obstructive pulmonary disease, unspecified: Secondary | ICD-10-CM | POA: Insufficient documentation

## 2015-12-02 DIAGNOSIS — R079 Chest pain, unspecified: Secondary | ICD-10-CM | POA: Diagnosis present

## 2015-12-02 DIAGNOSIS — F17210 Nicotine dependence, cigarettes, uncomplicated: Secondary | ICD-10-CM | POA: Diagnosis not present

## 2015-12-02 DIAGNOSIS — G8929 Other chronic pain: Secondary | ICD-10-CM | POA: Insufficient documentation

## 2015-12-02 LAB — CBC
HCT: 38.6 % (ref 36.0–46.0)
Hemoglobin: 13 g/dL (ref 12.0–15.0)
MCH: 27.5 pg (ref 26.0–34.0)
MCHC: 33.7 g/dL (ref 30.0–36.0)
MCV: 81.6 fL (ref 78.0–100.0)
PLATELETS: 268 10*3/uL (ref 150–400)
RBC: 4.73 MIL/uL (ref 3.87–5.11)
RDW: 13.7 % (ref 11.5–15.5)
WBC: 4.9 10*3/uL (ref 4.0–10.5)

## 2015-12-02 LAB — BASIC METABOLIC PANEL
Anion gap: 11 (ref 5–15)
BUN: 10 mg/dL (ref 6–20)
CHLORIDE: 106 mmol/L (ref 101–111)
CO2: 26 mmol/L (ref 22–32)
CREATININE: 0.88 mg/dL (ref 0.44–1.00)
Calcium: 9.6 mg/dL (ref 8.9–10.3)
GFR calc Af Amer: >60 mL/min (ref >60)
GFR calc non Af Amer: >60 mL/min (ref >60)
Glucose, Bld: 94 mg/dL (ref 65–99)
Potassium: 4 mmol/L (ref 3.5–5.1)
Sodium: 143 mmol/L (ref 135–145)

## 2015-12-02 LAB — I-STAT TROPONIN, ED: Troponin i, poc: 0 ng/mL (ref 0.00–0.08)

## 2015-12-02 NOTE — ED Notes (Signed)
Did not answer

## 2015-12-02 NOTE — ED Notes (Signed)
Pt reports she began to have heartburn/chest pain for the last 24 hours. Pt has been belching throughout the day. Pt reports pain is on L side of chest, center of chest and was up to her neck earlier today. No dizziness or SOB.

## 2015-12-02 NOTE — ED Notes (Signed)
Pt did not answer from the lobby.

## 2015-12-02 NOTE — ED Notes (Signed)
Patient called a third time with no answer. 

## 2015-12-04 ENCOUNTER — Telehealth: Payer: Self-pay | Admitting: Medical

## 2015-12-04 ENCOUNTER — Encounter: Payer: Self-pay | Admitting: Medical

## 2015-12-04 ENCOUNTER — Encounter: Payer: Self-pay | Admitting: Physician Assistant

## 2015-12-04 ENCOUNTER — Ambulatory Visit (INDEPENDENT_AMBULATORY_CARE_PROVIDER_SITE_OTHER): Payer: 59 | Admitting: Medical

## 2015-12-04 VITALS — BP 108/70 | HR 61 | Wt 142.0 lb

## 2015-12-04 DIAGNOSIS — Z720 Tobacco use: Secondary | ICD-10-CM

## 2015-12-04 DIAGNOSIS — R079 Chest pain, unspecified: Principal | ICD-10-CM

## 2015-12-04 DIAGNOSIS — J42 Unspecified chronic bronchitis: Secondary | ICD-10-CM

## 2015-12-04 DIAGNOSIS — Z87891 Personal history of nicotine dependence: Secondary | ICD-10-CM

## 2015-12-04 DIAGNOSIS — K219 Gastro-esophageal reflux disease without esophagitis: Secondary | ICD-10-CM

## 2015-12-04 DIAGNOSIS — R1032 Left lower quadrant pain: Secondary | ICD-10-CM

## 2015-12-04 DIAGNOSIS — F17200 Nicotine dependence, unspecified, uncomplicated: Secondary | ICD-10-CM

## 2015-12-04 DIAGNOSIS — R12 Heartburn: Secondary | ICD-10-CM | POA: Diagnosis not present

## 2015-12-04 DIAGNOSIS — G8929 Other chronic pain: Secondary | ICD-10-CM

## 2015-12-04 MED ORDER — DEXLANSOPRAZOLE 60 MG PO CPDR: 60.0000 mg | DELAYED_RELEASE_CAPSULE | Freq: Every day | ORAL | Status: DC

## 2015-12-04 NOTE — Telephone Encounter (Signed)
Refer to GI for chonic GERD, chest pain, need for EGD

## 2015-12-04 NOTE — Telephone Encounter (Signed)
Referred to [facility]

## 2015-12-04 NOTE — Progress Notes (Signed)
Subjective: Chief Complaint  Patient presents with  . Follow-up    hospital f/up. had to go back again and was put on zantac. pt states she doesnt feel right still.    Here for f/u.  Went to ED recent and cardiology for chest pain and heartburn.  Cardiology said it wasn't her heart.   She notes taking OTC Nexium daily, just added Zantac daily, not helping much.   Eats some acidic and spicy foods.  Continues to smoke but trying to cut down.  She has no hx/o EGD or Pylori.    Has ongoing discomfort in lower abdomen.   No bowel or urinary change, no vaginal bleeding, no fever, no weight loss.    Compliant with medications  Objective: BP 108/70 mmHg  Pulse 61  Wt 142 lb (64.411 kg)  General appearance: alert, no distress, WD/WN HEENT: normocephalic, sclerae anicteric, TMs pearly, nares patent, no discharge or erythema, pharynx normal Oral cavity: MMM, no lesions Neck: supple, no lymphadenopathy, no thyromegaly, no masses Heart: RRR, normal S1, S2, no murmurs Lungs:decreased lung sounds in general, but no wheezes, rhonchi, or rales Abdomen: +bs, soft, LLQ tenderness, otherwise non tender, non distended, no masses, no hepatomegaly, no splenomegaly Pulses: 2+ symmetric, upper and lower extremities, normal cap refill Ext: no edema     Assessment: Encounter Diagnoses  Name Primary?  . Heart burn Yes  . History of smoking   . Chronic bronchitis, unspecified chronic bronchitis type ([place])   . TOBACCO ABUSE   . CHEST PAIN   . LLQ pain     Plan: reviewed recent ED visit where she only was in triage, reviewed recent cardiology notes. Heartburn - begin dexilant, stop Nexium, can c/t zantac, referral to GI Hx/o smoking - counseled on cessation COPD note in chart without prior PFTs.  PFTs show possible early COPD, and given her long smoking hx/o seems likely; again counseled on cessation Chest pain - due to GERD LLQ pain - return soon for exam/physical/labs Return soon for physical,  labs.

## 2015-12-08 ENCOUNTER — Encounter: Payer: Self-pay | Admitting: Physician Assistant

## 2015-12-08 ENCOUNTER — Ambulatory Visit (INDEPENDENT_AMBULATORY_CARE_PROVIDER_SITE_OTHER): Payer: 59 | Admitting: Physician Assistant

## 2015-12-08 VITALS — BP 110/74 | HR 60 | Ht 66.0 in | Wt 145.2 lb

## 2015-12-08 DIAGNOSIS — K219 Gastro-esophageal reflux disease without esophagitis: Secondary | ICD-10-CM | POA: Diagnosis not present

## 2015-12-08 DIAGNOSIS — R12 Heartburn: Secondary | ICD-10-CM | POA: Diagnosis not present

## 2015-12-08 DIAGNOSIS — R079 Chest pain, unspecified: Secondary | ICD-10-CM

## 2015-12-08 MED ORDER — SUCRALFATE 1 GM/10ML PO SUSP: 1.0000 g | Freq: Three times a day (TID) | ORAL | Status: DC

## 2015-12-08 NOTE — Patient Instructions (Signed)
You have been scheduled for an endoscopy. Please follow written instructions given to you at your visit today. If you use inhalers (even only as needed), please bring them with you on the day of your procedure. Your physician has requested that you go to www.startemmi.com and enter the access code given to you at your visit today. This web site gives a general overview about your procedure. However, you should still follow specific instructions given to you by our office regarding your preparation for the procedure.  We sent a prescription to [facility]. For Carafate liquid.  Continue the Dexilant tablets.   Antireflux information provided.

## 2015-12-08 NOTE — Progress Notes (Signed)
Patient ID: Emma Wilson, female   DOB: 04-05-1956, 60 y.o.   MRN: [id number]   Subjective:    Patient ID: Emma Wilson, female    DOB: 06-30-1956, 60 y.o.   MRN: [id number]  HPI  A[place]ia  Is a pleasant 60 year old African-American female new to GI today referred by primary care/Emma Tysinger PA-C for evaluation of chest pain and indigestion. Patient says she has had her current symptoms on a constant daily basis over the past month or so. She had undergone cardiac eval last week and it was felt that her symptoms were extremely atypical and not representative of any cardiac issue and no further workup was indicated. She did have an echo done in 2015 with normal LV function.   She was started on Dexilant last week. Prior to that she had been on Nexium when necessary for heartburn type symptoms. She said when she started having constant symptom she increase this to daily and then added Zantac at bedtime without any benefit. So far she has had no improvement on Dexilant . She describes a constant burning and discomfort in her chest but says her chest also feels "sore". She has no dysphagia or odynophagia. She does take naproxen on a fairly regular basis and has history of fibromyalgia.  Review of Systems Pertinent positive and negative review of systems were noted in the above HPI section.  All other review of systems was otherwise negative.  Outpatient Encounter Prescriptions as of 12/08/2015  Medication Sig  . Acetaminophen (TYLENOL ARTHRITIS EXT RELIEF PO) Take 1 tablet by mouth every 4 (four) hours as needed (pain).  Marland Kitchen atorvastatin (LIPITOR) 20 MG tablet Take 1 tablet (20 mg total) by mouth daily.  . calcium gluconate 500 MG tablet Take 500 mg by mouth daily.  . cholecalciferol (VITAMIN D) 1000 UNITS tablet Take 2,000 Units by mouth daily.   Marland Kitchen dexlansoprazole (DEXILANT) 60 MG capsule Take 1 capsule (60 mg total) by mouth daily.  . diclofenac sodium (VOLTAREN) 1 % GEL Apply 1 application  topically 3 (three) times daily.   Marland Kitchen gabapentin (NEURONTIN) 100 MG capsule Take 100-200 mg by mouth 2 (two) times daily. Take 100mg  twice daily for 1 week then take 200mg  twice daily  . levothyroxine (SYNTHROID) 150 MCG tablet Take 1 tablet (150 mcg total) by mouth daily before breakfast.  . mirtazapine (REMERON) 15 MG tablet Take 15 mg by mouth at bedtime.  . Multiple Vitamin (MULTIVITAMIN) tablet Take 1 tablet by mouth daily.  . naproxen (NAPROSYN) 500 MG tablet Take 500 mg by mouth daily as needed for mild pain.   . traMADol (ULTRAM) 50 MG tablet Take 50 mg by mouth every 8 (eight) hours as needed for moderate pain.   Marland Kitchen sucralfate (CARAFATE) 1 GM/10ML suspension Take 10 mLs (1 g total) by mouth 4 (four) times daily -  with meals and at bedtime.  . [DISCONTINUED] esomeprazole (NEXIUM) 20 MG capsule Take 20 mg by mouth daily at 12 noon.  . [DISCONTINUED] ranitidine (ZANTAC) 150 MG capsule Take 150 mg by mouth daily.   No facility-administered encounter medications on file as of 12/08/2015.   No Known Allergies Patient Active Problem List   Diagnosis Date Noted  . History of smoking 12/04/2015  . Abnormality of gait 03/19/2014  . Chest pain 12/05/2013  . Pure hypercholesterolemia 01/24/2013  . Hematuria 01/24/2013  . Fibromyalgia 01/24/2013  . Bradycardia 03/25/2011  . TOBACCO ABUSE 02/12/2010  . THYROID CANCER 02/11/2010  . HYPOTHYROIDISM 02/11/2010  .  DEPRESSION 02/11/2010  . SINUS BRADYCARDIA 02/11/2010  . COPD 02/11/2010  . GERD 02/11/2010  . DIZZINESS 02/11/2010  . CHEST PAIN 02/11/2010   Social History   Social History  . Marital Status: Divorced    Spouse Name: N/A  . Number of Children: 3  . 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 Smoking status: Current Every Day Smoker -- 1.00 packs/day for 34 years    Types: Cigarettes  . Smokeless tobacco: Never Used  . Alcohol Use: 0.0 oz/week    0 Standard  drinks or equivalent per week     Comment: once to twice a week   . Drug Use: No  . Sexual Activity:    Partners: Male   Other Topics Concern  . Not on file   Social History Narrative   Lives alone.  2 living children (1 deceased), 1 in [place], 1 in [place], 6 grandchildren.  Works at [employer].   Exercise some.  As of 11/2015    Emma Wilson's family history includes Arthritis in her sister and sister; Cancer in her father; Coronary artery disease in her mother; Diabetes in her son; Fibromyalgia in her sister; Heart disease in her mother; Kidney disease in her mother; Stroke in her mother.      Objective:    Filed Vitals:   12/08/15 1034  BP: 110/74  Pulse: 60    Physical Exam   Well-developed African American female in no acute distress, pleasant blood pressure 110/74 pulse 60 height 5 foot 6 weight 145 HEENT ;nontraumatic normocephalic EOMI PERRLA sclera anicteric  Cardiovascular ;regular rate and rhythm with [id number] systolic murmur, Pulmonary ;clear bilaterally, Abdomen ;soft nontende,r nondistended , she does have some chest wall tenderness , Bowel sounds are present no palpable mass or hepatosplenomegaly, Rectal; exam not done, Ext; no clubbing cyanosis or edema skin warm and dry, Neuropsych; mood and affect appropriate     Assessment & Plan:   #1 60 yo female  With one month hx of daily constant burning chest pain- cardiac eval negative-thus far no improvement on Dexilant R/O esophagitis R/O primary M-S etiology #2 hs Fibromyalgia #3 Hx thyroid CA  Plan; Schedule for EGD with Emma Wilson- procedure discussed in detail with pt and she is agreeable to proceed Strict antireflux regimen   Continue Dexilant 60 mg until post EGD then would hope to wean back to another PPI Add carafate 1 gm liquid between meals and bedtime x 2 weeks  Emma Araiza S Gennie Eisinger PA-C 12/08/2015   Cc: Emma Hurl, PA-C

## 2015-12-09 NOTE — Progress Notes (Signed)
Agree with Ms. Genia Harold assessment and plan. Gatha Mayer, MD, [facility]   Additional diagnostic option would be manometry/24 hr pH impedance but very unlikely this is GERD given lack of response to Dexilant.

## 2015-12-22 ENCOUNTER — Other Ambulatory Visit: Payer: Self-pay | Admitting: Medical

## 2015-12-22 ENCOUNTER — Telehealth: Payer: Self-pay | Admitting: Medical

## 2015-12-22 NOTE — Telephone Encounter (Signed)
Sent letter no phone number on file

## 2015-12-22 NOTE — Telephone Encounter (Signed)
Mammogram normal/no worrisome findings. 

## 2015-12-25 ENCOUNTER — Telehealth: Payer: Self-pay | Admitting: *Deleted

## 2015-12-25 NOTE — Telephone Encounter (Signed)
Patient signed, " Release of Information',  form on 12-08-2015.  We got the records faxed back to [place] from [facility]. [facility] on 12-25-2015.  Gave to [employer] PA-C on 12-25-2015 for review. Once reviewed , they will be stamped and sent to Medical Records department for scanning. PP

## 2016-01-08 ENCOUNTER — Ambulatory Visit (AMBULATORY_SURGERY_CENTER): Payer: 59 | Admitting: Internal Medicine

## 2016-01-08 ENCOUNTER — Encounter: Payer: Self-pay | Admitting: Internal Medicine

## 2016-01-08 VITALS — BP 97/62 | HR 66 | Temp 99.1°F | Resp 15 | Ht 66.0 in | Wt 145.0 lb

## 2016-01-08 DIAGNOSIS — K219 Gastro-esophageal reflux disease without esophagitis: Secondary | ICD-10-CM

## 2016-01-08 DIAGNOSIS — D132 Benign neoplasm of duodenum: Secondary | ICD-10-CM

## 2016-01-08 MED ORDER — SODIUM CHLORIDE 0.9 % IV SOLN: 500.0000 mL | INTRAVENOUS | Status: DC

## 2016-01-08 NOTE — Patient Instructions (Addendum)
The esophagus looks ok.  I did see a polyp in the duodenum - does not look like a bad thing but I took biopsies to understand it better and will let you know.  I appreciate the opportunity to care for you. Gatha Mayer, MD, FACG   YOU HAD AN ENDOSCOPIC PROCEDURE TODAY AT [facility]:   Refer to the procedure report that was given to you for any specific questions about what was found during the examination.  If the procedure report does not answer your questions, please call your gastroenterologist to clarify.  If you requested that your care partner not be given the details of your procedure findings, then the procedure report has been included in a sealed envelope for you to review at your convenience later.  YOU SHOULD EXPECT: Some feelings of bloating in the abdomen. Passage of more gas than usual.  Walking can help get rid of the air that was put into your GI tract during the procedure and reduce the bloating. If you had a lower endoscopy (such as a colonoscopy or flexible sigmoidoscopy) you may notice spotting of blood in your stool or on the toilet paper. If you underwent a bowel prep for your procedure, you may not have a normal bowel movement for a few days.  Please Note:  You might notice some irritation and congestion in your nose or some drainage.  This is from the oxygen used during your procedure.  There is no need for concern and it should clear up in a day or so.  SYMPTOMS TO REPORT IMMEDIATELY:     Following upper endoscopy (EGD)  Vomiting of blood or coffee ground material  New chest pain or pain under the shoulder blades  Painful or persistently difficult swallowing  New shortness of breath  Fever of 100F or higher  Black, tarry-looking stools  For urgent or emergent issues, a gastroenterologist can be reached at any hour by calling [phone].   DIET: Your first meal following the procedure should be a small meal and then it is ok to  progress to your normal diet. Heavy or fried foods are harder to digest and may make you feel nauseous or bloated.  Likewise, meals heavy in dairy and vegetables can increase bloating.  Drink plenty of fluids but you should avoid alcoholic beverages for 24 hours.  ACTIVITY:  You should plan to take it easy for the rest of today and you should NOT DRIVE or use heavy machinery until tomorrow (because of the sedation medicines used during the test).    FOLLOW UP: Our staff will call the number listed on your records the next business day following your procedure to check on you and address any questions or concerns that you may have regarding the information given to you following your procedure. If we do not reach you, we will leave a message.  However, if you are feeling well and you are not experiencing any problems, there is no need to return our call.  We will assume that you have returned to your regular daily activities without incident.  If any biopsies were taken you will be contacted by phone or by letter within the next 1-3 weeks.  Please call us at [phone] if you have not heard about the biopsies in 3 weeks.    SIGNATURES/CONFIDENTIALITY: You and/or your care partner have signed paperwork which will be entered into your electronic medical record.  These signatures attest to the fact  that that the information above on your After Visit Summary has been reviewed and is understood.  Full responsibility of the confidentiality of this discharge information lies with you and/or your care-partner.   Information on polyps given to you today

## 2016-01-08 NOTE — Progress Notes (Signed)
Called to room to assist during endoscopic procedure.  Patient ID and intended procedure confirmed with present staff. Received instructions for my participation in the procedure from the performing physician.  

## 2016-01-08 NOTE — Op Note (Signed)
[place]  [address] [place], [address]   ENDOSCOPY PROCEDURE REPORT  PATIENT: Emma Wilson, Emma Wilson  MR#: [id number] BIRTHDATE: 1956/08/02 , 59  yrs. old GENDER: female ENDOSCOPIST: Gatha Mayer, MD, [facility] PROCEDURE DATE:  01/08/2016 PROCEDURE:  EGD w/ biopsy ASA CLASS:     Class II INDICATIONS:  chest pain and heartburn. MEDICATIONS: Propofol 130 mg IV and Monitored anesthesia care TOPICAL ANESTHETIC: none  DESCRIPTION OF PROCEDURE: After the risks benefits and alternatives of the procedure were thoroughly explained, informed consent was obtained.  The [id number] endoscope was introduced through the mouth and advanced to the second portion of the duodenum , Without limitations.  The instrument was slowly withdrawn as the mucosa was fully examined.    Soft, smooth polyp in the second portion of the duodenum - ? submucosal.  Biopsies taken (tunnel).   Except for the findings listed, the EGD was otherwise normal.  Retroflexed views revealed no abnormalities.     The scope was then withdrawn from the patient and the procedure completed.  COMPLICATIONS: There were no immediate complications.  ENDOSCOPIC IMPRESSION: 1.   Soft, smooth polyp in the second portion of the duodenum - ? submucosal.  15-20 mm. Biopsies taken (tunnel) 2.   EGD was otherwise normal  RECOMMENDATIONS: 1.  Await pathology results 2.  Office will call with results 3.  She may have funtional heartburn - stay on PPI for now.   eSigned:  Gatha Mayer, MD, [facility] 01/08/2016 3:23 PM    CC:The Patient and Dorothea Ogle, PA-C

## 2016-01-09 ENCOUNTER — Telehealth: Payer: Self-pay | Admitting: Emergency Medicine

## 2016-01-09 NOTE — Telephone Encounter (Signed)
  Follow up Call-  Call back number 01/08/2016  Post procedure Call Back phone  # [phone]  Permission to leave phone message Yes     Patient questions:  Do you have a fever, pain , or abdominal swelling? No. Pain Score  0 *  Have you tolerated food without any problems? Yes.    Have you been able to return to your normal activities? Yes.    Do you have any questions about your discharge instructions: Diet   No. Medications  No. Follow up visit  No.  Do you have questions or concerns about your Care? No.  Actions: * If pain score is 4 or above: No action needed, pain <4.

## 2016-01-14 ENCOUNTER — Ambulatory Visit (INDEPENDENT_AMBULATORY_CARE_PROVIDER_SITE_OTHER): Payer: 59 | Admitting: Medical

## 2016-01-14 ENCOUNTER — Ambulatory Visit
Admission: RE | Admit: 2016-01-14 | Discharge: 2016-01-14 | Disposition: A | Discharge status: Home / Self Care | Payer: 59 | Source: Ambulatory Visit | Attending: Medical | Admitting: Medical

## 2016-01-14 ENCOUNTER — Encounter: Payer: Self-pay | Admitting: Medical

## 2016-01-14 ENCOUNTER — Other Ambulatory Visit: Payer: Self-pay | Admitting: Medical

## 2016-01-14 VITALS — BP 118/70 | HR 71 | Temp 99.7°F | Resp 18 | Wt 139.0 lb

## 2016-01-14 DIAGNOSIS — R6889 Other general symptoms and signs: Secondary | ICD-10-CM

## 2016-01-14 DIAGNOSIS — R110 Nausea: Secondary | ICD-10-CM

## 2016-01-14 DIAGNOSIS — R062 Wheezing: Secondary | ICD-10-CM | POA: Diagnosis not present

## 2016-01-14 LAB — POC INFLUENZA A&B (BINAX/QUICKVUE)
INFLUENZA B, POC: NEGATIVE
Influenza A, POC: NEGATIVE

## 2016-01-14 MED ORDER — ALBUTEROL SULFATE HFA 108 (90 BASE) MCG/ACT IN AERS: 2.0000 | INHALATION_SPRAY | Freq: Four times a day (QID) | RESPIRATORY_TRACT | Status: DC | PRN

## 2016-01-14 NOTE — Progress Notes (Signed)
  Subjective:  Emma Wilson is a 60 y.o. female who presents for illness, cough.  Symptoms include 2 day hx/o cough, fever, headache, sore throat, wheezing, chills, body aches, nausea.   Came on abruptly.  Denies vomiting, diarrhea.  +sick contacts.  No other aggravating or relieving factors.  No other c/o.  The following portions of the patient's history were reviewed and updated as appropriate: allergies, current medications, past medical history, past social history and problem list.  ROS as in subjective   Past Medical History  Diagnosis Date  . COPD (chronic obstructive pulmonary disease) ([place])   . GERD (gastroesophageal reflux disease)   . Sinus bradycardia   . Hypothyroidism   . Hematuria     microscopic hematuria (urol & nephrol w/u neg), multiple UTI`s, glomerular from Sickle Trait, Dr. Jeffie Pollock 2014  . Fibromyalgia   . Carpal tunnel syndrome     right wrist  . Hyperlipidemia     managed by Dr. Ouida Sills  . Pneumonia     "abuntantly as a child"  . Shortness of breath     "at rest" (12/05/2013)  . Migraine     "q 3-4 months" (12/05/2013)  . Chronic back pain   . Depression 2006    "after my son passed" (12/05/2013)  . Thyroid cancer ([place])     S/P radiation & OR  . Abnormality of gait 03/19/2014  . Osteoarthritis     Rheumatoid and OA?  sees Dr. Ouida Sills [facility], rheumatology)  . Rheumatoid arthritis (HCC)      Objective: BP 118/70 mmHg  Pulse 71  Temp(Src) 99.7 F (37.6 C) (Tympanic)  Resp 18  Wt 139 lb (63.05 kg)  SpO2 95%  General: Ill-appearing, well-developed, well-nourished Skin: warm, dry HEENT: Nose inflamed and congested, clear conjunctiva, TMs pearly, no sinus tenderness, pharynx with erythema, no exudates Neck: Supple, non tender, shoddy cervical adenopathy Heart: Regular rate and rhythm, normal S1, S2, no murmurs Lungs: Clear to auscultation bilaterally, no wheezes, rales, rhonchi Extremities: Mild generalized  tenderness   Assessment: Encounter Diagnoses  Name Primary?  . Flu-like symptoms Yes  . Nausea without vomiting   . Wheezing     Plan: Flu swab negative despite flu symptoms.  Will send for CXR.    Assume the flu unless chest xray shows otherwise.   Discussed supportive care including rest, hydration, OTC Tylenol or NSAID for fever, aches, and malaise.  Discussed period of contagion, self quarantine at home away from others to avoid spread of disease, discussed means of transmission, and possible complications including pneumonia.  If worse or not improving within the next 4-5 days, then call or return.  Patient voiced understanding of diagnosis, recommendations, and treatment plan.  Gave note for work.

## 2016-01-14 NOTE — Progress Notes (Signed)
Emma Wilson checked her in and states she did verify phone number. So I'm not sure what happened but I will work on it.

## 2016-01-15 NOTE — Progress Notes (Signed)
Quick Note:  Office - let her know that the biopsies did not reveal cause of the polyp in the duodenum I am going to ask Dr. Ardis Hughs to do an EUS - she will hear back from [place] about that but give her a heads up  [place] - no letter and no recall ______

## 2016-01-19 ENCOUNTER — Telehealth: Payer: Self-pay

## 2016-01-19 NOTE — Telephone Encounter (Signed)
-----   Message from Milus Banister, MD sent at 01/17/2016  7:20 AM EST ----- Regarding: RE: EUS please I reviewed your EGD images, agree EUS is a good next step. We'll get in touch with her.  Denny Mccree, She needs upper EUS, radial +/- linear, next available EUS Thursday with MAC for duodenal lesion.  Thanks   ----- Message -----    From: Gatha Mayer, MD    Sent: 01/15/2016  12:51 PM      To: Milus Banister, MD Subject: EUS please                                     This lady had incidental polypoid lesion in duodenum - as expected bxs negative as it seems submucosal  Would you be willing to do EUS?  Thanks  Glendell Docker

## 2016-01-21 ENCOUNTER — Other Ambulatory Visit: Payer: Self-pay

## 2016-01-21 DIAGNOSIS — K319 Disease of stomach and duodenum, unspecified: Secondary | ICD-10-CM

## 2016-01-22 NOTE — Telephone Encounter (Signed)
EUS scheduled for 02/05/16 830 am instructions mailed to the pt and sent via My Chart.  Left message on machine to call back regarding verbal instructions

## 2016-01-22 NOTE — Telephone Encounter (Signed)
EUS scheduled, pt instructed and medications reviewed.  Patient instructions mailed to home.  Patient to call with any questions or concerns.  

## 2016-01-31 ENCOUNTER — Encounter (HOSPITAL_COMMUNITY): Payer: Self-pay | Admitting: Anesthesiology

## 2016-01-31 NOTE — Anesthesia Preprocedure Evaluation (Deleted)
Anesthesia Evaluation  Patient identified by MRN, date of birth, ID band Patient awake    Reviewed: Allergy & Precautions, NPO status , Patient's Chart, lab work & pertinent test results  Airway        Dental   Pulmonary neg pulmonary ROS, COPD, Current Smoker (34 pack year),           Cardiovascular negative cardio ROS    ECHO 2015 normal EF, normal valves, EKG 11/2015 [facility]   Neuro/Psych Depression negative neurological ROS  negative psych ROS   GI/Hepatic negative GI ROS, Neg liver ROS, GERD  Medicated,  Endo/Other  negative endocrine ROSHypothyroidism (on replacement, SP thyroidectomay for CA)   Renal/GU negative Renal ROS  negative genitourinary   Musculoskeletal negative musculoskeletal ROS (+)   Abdominal   Peds negative pediatric ROS (+)  Hematology negative hematology ROS (+)   Anesthesia Other Findings   Reproductive/Obstetrics negative OB ROS                             Anesthesia Physical Anesthesia Plan  ASA: II  Anesthesia Plan: MAC   Post-op Pain Management:    Induction: Intravenous  Airway Management Planned: Nasal Cannula  Additional Equipment:   Intra-op Plan:   Post-operative Plan:   Informed Consent: I have reviewed the patients History and Physical, chart, labs and discussed the procedure including the risks, benefits and alternatives for the proposed anesthesia with the patient or authorized representative who has indicated his/her understanding and acceptance.     Plan Discussed with:   Anesthesia Plan Comments:         Anesthesia Quick Evaluation

## 2016-02-02 ENCOUNTER — Telehealth: Payer: Self-pay | Admitting: Gastroenterology

## 2016-02-02 DIAGNOSIS — K319 Disease of stomach and duodenum, unspecified: Secondary | ICD-10-CM

## 2016-02-02 NOTE — Telephone Encounter (Signed)
Left message on machine to call back  

## 2016-02-04 NOTE — Progress Notes (Signed)
Pt left message on 01-29-16, 02-02-16, 02-03-16, 02-04-16, pt never returned call, left voicemail message npo after midght arrive 700 am [facility] admitting.

## 2016-02-05 ENCOUNTER — Ambulatory Visit (HOSPITAL_COMMUNITY): Admission: RE | Admit: 2016-02-05 | Payer: 59 | Source: Ambulatory Visit | Admitting: Gastroenterology

## 2016-02-05 ENCOUNTER — Encounter (HOSPITAL_COMMUNITY): Admission: RE | Payer: Self-pay | Source: Ambulatory Visit

## 2016-02-05 SURGERY — UPPER ENDOSCOPIC ULTRASOUND (EUS) RADIAL
Anesthesia: Monitor Anesthesia Care

## 2016-02-05 MED ORDER — PROPOFOL 10 MG/ML IV BOLUS
INTRAVENOUS | Status: AC
Start: 2016-02-05 — End: 2016-02-05
  Filled 2016-02-05: qty 40

## 2016-02-25 ENCOUNTER — Other Ambulatory Visit: Payer: Self-pay

## 2016-02-25 DIAGNOSIS — K319 Disease of stomach and duodenum, unspecified: Secondary | ICD-10-CM

## 2016-02-25 NOTE — Telephone Encounter (Signed)
Left message on machine to call back  

## 2016-02-25 NOTE — Telephone Encounter (Signed)
EUS scheduled, pt instructed and medications reviewed.  Patient instructions mailed to home.  Patient to call with any questions or concerns.  

## 2016-03-12 ENCOUNTER — Encounter (HOSPITAL_COMMUNITY): Payer: Self-pay | Admitting: *Deleted

## 2016-03-17 NOTE — Anesthesia Preprocedure Evaluation (Addendum)
Anesthesia Evaluation  Patient identified by MRN, date of birth, ID band Patient awake    Reviewed: Allergy & Precautions, NPO status , Patient's Chart, lab work & pertinent test results  Airway Mallampati: II  TM Distance: >3 FB Neck ROM: Full    Dental  (+) Teeth Intact   Pulmonary neg pulmonary ROS, COPD, Current Smoker,    Pulmonary exam normal breath sounds clear to auscultation       Cardiovascular negative cardio ROS   Rhythm:Regular Rate:Normal  ECHO 2015 normal EF, normal valves, EKG 11/2015 [facility]   Neuro/Psych Depression negative neurological ROS  negative psych ROS   GI/Hepatic negative GI ROS, Neg liver ROS, GERD  Medicated,  Endo/Other  negative endocrine ROSHypothyroidism (on replacement, SP thyroidectomay for CA)   Renal/GU negative Renal ROS  negative genitourinary   Musculoskeletal negative musculoskeletal ROS (+)   Abdominal   Peds negative pediatric ROS (+)  Hematology negative hematology ROS (+)   Anesthesia Other Findings   Reproductive/Obstetrics negative OB ROS                            Anesthesia Physical  Anesthesia Plan  ASA: II  Anesthesia Plan: MAC   Post-op Pain Management:    Induction: Intravenous  Airway Management Planned: Nasal Cannula  Additional Equipment:   Intra-op Plan:   Post-operative Plan:   Informed Consent: I have reviewed the patients History and Physical, chart, labs and discussed the procedure including the risks, benefits and alternatives for the proposed anesthesia with the patient or authorized representative who has indicated his/her understanding and acceptance.     Plan Discussed with:   Anesthesia Plan Comments:         Anesthesia Quick Evaluation

## 2016-03-18 ENCOUNTER — Ambulatory Visit (HOSPITAL_COMMUNITY)
Admission: RE | Admit: 2016-03-18 | Discharge: 2016-03-18 | Disposition: A | Discharge status: Home / Self Care | Payer: 59 | Source: Ambulatory Visit | Attending: Gastroenterology | Admitting: Gastroenterology

## 2016-03-18 ENCOUNTER — Ambulatory Visit (HOSPITAL_COMMUNITY): Payer: 59 | Admitting: Anesthesiology

## 2016-03-18 ENCOUNTER — Encounter (HOSPITAL_COMMUNITY)
Admission: RE | Disposition: A | Discharge status: Home / Self Care | Payer: Self-pay | Source: Ambulatory Visit | Attending: Gastroenterology

## 2016-03-18 ENCOUNTER — Encounter (HOSPITAL_COMMUNITY): Payer: Self-pay | Admitting: Anesthesiology

## 2016-03-18 DIAGNOSIS — K3189 Other diseases of stomach and duodenum: Secondary | ICD-10-CM | POA: Insufficient documentation

## 2016-03-18 DIAGNOSIS — E039 Hypothyroidism, unspecified: Secondary | ICD-10-CM | POA: Diagnosis not present

## 2016-03-18 DIAGNOSIS — Z85850 Personal history of malignant neoplasm of thyroid: Secondary | ICD-10-CM | POA: Insufficient documentation

## 2016-03-18 DIAGNOSIS — K319 Disease of stomach and duodenum, unspecified: Secondary | ICD-10-CM

## 2016-03-18 DIAGNOSIS — J449 Chronic obstructive pulmonary disease, unspecified: Secondary | ICD-10-CM | POA: Insufficient documentation

## 2016-03-18 DIAGNOSIS — F17210 Nicotine dependence, cigarettes, uncomplicated: Secondary | ICD-10-CM | POA: Diagnosis not present

## 2016-03-18 DIAGNOSIS — K219 Gastro-esophageal reflux disease without esophagitis: Secondary | ICD-10-CM | POA: Diagnosis not present

## 2016-03-18 DIAGNOSIS — E785 Hyperlipidemia, unspecified: Secondary | ICD-10-CM | POA: Insufficient documentation

## 2016-03-18 DIAGNOSIS — M069 Rheumatoid arthritis, unspecified: Secondary | ICD-10-CM | POA: Insufficient documentation

## 2016-03-18 DIAGNOSIS — Z923 Personal history of irradiation: Secondary | ICD-10-CM | POA: Insufficient documentation

## 2016-03-18 DIAGNOSIS — M1990 Unspecified osteoarthritis, unspecified site: Secondary | ICD-10-CM | POA: Diagnosis not present

## 2016-03-18 HISTORY — PX: EUS: SHX5427

## 2016-03-18 SURGERY — UPPER ENDOSCOPIC ULTRASOUND (EUS) RADIAL
Anesthesia: Monitor Anesthesia Care

## 2016-03-18 MED ORDER — PROPOFOL 10 MG/ML IV BOLUS
INTRAVENOUS | Status: DC | PRN
  Administered 2016-03-18: 60 mg via INTRAVENOUS
  Administered 2016-03-18 (×2): 20 mg via INTRAVENOUS

## 2016-03-18 MED ORDER — PROPOFOL 10 MG/ML IV BOLUS
INTRAVENOUS | Status: AC
  Filled 2016-03-18: qty 20

## 2016-03-18 MED ORDER — SODIUM CHLORIDE 0.9 % IV SOLN: INTRAVENOUS | Status: DC

## 2016-03-18 MED ORDER — LACTATED RINGERS IV SOLN
INTRAVENOUS | Status: DC
  Administered 2016-03-18: 1000 mL via INTRAVENOUS

## 2016-03-18 MED ORDER — PROPOFOL 500 MG/50ML IV EMUL
INTRAVENOUS | Status: DC | PRN
  Administered 2016-03-18: 75 ug/kg/min via INTRAVENOUS

## 2016-03-18 NOTE — Op Note (Signed)
[facility] Patient Name: Emma Wilson Procedure Date: 03/18/2016 MRN: [id number] Attending MD: Milus Banister , MD Date of Birth: 05/10/1956 CSN:  Age: 60 Admit Type: Outpatient Procedure:                Upper EUS Indications:              Duodenal mucosal mass/polyp found on endoscopy                            (nodule in duodenum noted on recent EGD) Providers:                Milus Banister, MD, Malka So, RN, Elspeth Cho, Technician Referring MD:             Silvano Rusk, MD Medicines:                Monitored Anesthesia Care Complications:            No immediate complications. Estimated blood loss:                            None. Estimated Blood Loss:     Estimated blood loss: none. Procedure:                Pre-Anesthesia Assessment:                           - Prior to the procedure, a History and Physical                            was performed, and patient medications and                            allergies were reviewed. The patient's tolerance of                            previous anesthesia was also reviewed. The risks                            and benefits of the procedure and the sedation                            options and risks were discussed with the patient.                            All questions were answered, and informed consent                            was obtained. Prior Anticoagulants: The patient has                            taken no previous anticoagulant or antiplatelet  agents. ASA Grade Assessment: II - A patient with                            mild systemic disease. After reviewing the risks                            and benefits, the patient was deemed in                            satisfactory condition to undergo the procedure.                           After obtaining informed consent, the endoscope was                            passed under direct  vision. Throughout the                            procedure, the patient's blood pressure, pulse, and                            oxygen saturations were monitored continuously. The                            [id number]) scope was introduced through                            the mouth, and advanced to the second part of                            duodenum. The upper EUS was accomplished without                            difficulty. The patient tolerated the procedure                            well. Scope In: Scope Out: Findings:      Endoscopic Finding :      The examined esophagus was endoscopically normal.      The entire examined stomach was endoscopically normal.      A single, soft 10 mm nodule was found in the second portion of the       duodenum.      Endosonographic Finding :      1. The dudoenal nodule described above correlated with an anechoic       (cystic) round mass. The mass measured 9 mm in maximal cross-sectional       diameter. The lesion appeared confined to the mucosa and deep mucosal       layers, without extension into deeper wall layers. The endosonographic       borders were well-defined. This was consistent with a small duplication       cyst. There were no adjacent or nearby solid masses.      2. No periportal adenopathy.      3. Limited views of pancreas, extrahepatic bile duct, liver, spleen were  all normal. 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 It is not causing                            any symptoms and does not require surveillance. Moderate Sedation:      N/A- Per Anesthesia Care Recommendation:           - Patient has a contact number available for                            emergencies. The signs and symptoms of potential                            delayed complications were discussed with the                            patient. Return  to normal activities tomorrow.                            Written discharge instructions were provided to the                            patient.                           - Discharge patient to home (ambulatory). Procedure Code(s):        --- Professional ---                           [phone], Esophagogastroduodenoscopy, flexible,                            transoral; with endoscopic ultrasound examination                            limited to the esophagus, stomach or duodenum, and                            adjacent structures Diagnosis Code(s):        --- Professional ---                           K31.89, Other diseases of stomach and duodenum CPT copyright 2016 American Medical Association. All rights reserved. The codes documented in this report are preliminary and upon coder review may  be revised to meet current compliance requirements. Milus Banister, MD 03/18/2016 7:53:18 AM This report has been signed electronically. Number of Addenda: 0

## 2016-03-18 NOTE — Discharge Instructions (Signed)

## 2016-03-18 NOTE — Anesthesia Postprocedure Evaluation (Signed)
Anesthesia Post Note  Patient: Emma Wilson  Procedure(s) Performed: Procedure(s) (LRB): UPPER ENDOSCOPIC ULTRASOUND (EUS) RADIAL (N/A)  Patient location during evaluation: PACU Anesthesia Type: MAC Level of consciousness: awake and alert Pain management: pain level controlled Vital Signs Assessment: post-procedure vital signs reviewed and stable Respiratory status: spontaneous breathing, nonlabored ventilation, respiratory function stable and patient connected to nasal cannula oxygen Cardiovascular status: stable and blood pressure returned to baseline Anesthetic complications: no    Last Vitals:  Filed Vitals:   03/18/16 0800 03/18/16 0805  BP: 128/78 128/78  Pulse: 57 52  Temp:    Resp: 18 15    Last Pain:  Filed Vitals:   03/18/16 0808  PainSc: 4                  Zenaida Deed

## 2016-03-18 NOTE — H&P (Signed)
HPI: This is a * woman found to have a nodule in duodenum on recent EGD (Dr. Carlean Purl)  Chief complaint is incidental duodenal nodule   Past Medical History  Diagnosis Date  . COPD (chronic obstructive pulmonary disease) ([place])   . GERD (gastroesophageal reflux disease)   . Sinus bradycardia   . Hypothyroidism   . Hematuria     microscopic hematuria (urol & nephrol w/u neg), multiple UTI`s, glomerular from Sickle Trait, Dr. Jeffie Pollock 2014  . Fibromyalgia   . Carpal tunnel syndrome     right wrist  . Hyperlipidemia     managed by Dr. Ouida Sills  . Pneumonia     "abuntantly as a child"  . Shortness of breath     "at rest" (12/05/2013)  . Migraine     "q 3-4 months" (12/05/2013)  . Chronic back pain   . Depression 2006    "after my son passed" (12/05/2013)  . Thyroid cancer ([place])     S/P radiation & OR  . Abnormality of gait 03/19/2014    one leg shorter than other"uses cane frequently"  . Osteoarthritis     Rheumatoid and OA?  sees Dr. Ouida Sills [facility] medical, rheumatology)  . Rheumatoid arthritis [facility])     Past Surgical History  Procedure Laterality Date  . Thyroidectomy, partial  11/2005; 11/2006  . Cystoscopy  02/26/2013    Dr. Jeffie Pollock (normal; follicular cystitis)  . Tubal ligation  1980's  . Colonoscopy  10/13/2010    external and internal hemorrhoids, repeat 2021; Dr. Clarene Essex  . Total abdominal hysterectomy  ~ 1990    due to bleeding    Current Facility-Administered Medications  Medication Dose Route Frequency Provider Last Rate Last Dose  . 0.9 %  sodium chloride infusion   Intravenous Continuous Milus Banister, MD      . lactated ringers infusion   Intravenous Continuous Milus Banister, MD 10 mL/hr at 03/18/16 0657 1,000 mL at 03/18/16 0657    Allergies as of 02/25/2016  . (No Known Allergies)    Family History  Problem Relation Age of Onset  . Stroke Mother   . Coronary artery disease Mother   . Kidney disease Mother     on dialysis  . Heart disease Mother    . Cancer Father     multiple myeloma  . Arthritis Sister     rheumatoid and osteoarthritis  . Fibromyalgia Sister   . Arthritis Sister     RA and OA  . Diabetes Son     Social History   Social History  . Marital Status: Divorced    Spouse Name: N/A  . Number of Children: 3  . 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 Smoking status: Current Every Day Smoker -- 1.00 packs/day for 34 years    Types: Cigarettes  . Smokeless tobacco: Never Used  . Alcohol Use: 0.0 oz/week    0 Standard drinks or equivalent per week     Comment: once to twice a week   . Drug Use: No  . Sexual Activity:    Partners: Male   Other Topics Concern  . Not on file   Social History Narrative   Lives alone.  2 living children (1 deceased), 1 in [place], 1 in [place], 6 grandchildren.  Works at [employer].   Exercise some.  As of 11/2015     Physical Exam: BP 135/87  mmHg  Pulse 57  Temp(Src) 98 F (36.7 C) (Oral)  Resp 10  Ht '5\' 6"'$  (1.676 m)  Wt 139 lb (63.05 kg)  BMI 22.45 kg/m2  SpO2 96% Constitutional: generally well-appearing Psychiatric: alert and oriented x3 Abdomen: soft, nontender, nondistended, no obvious ascites, no peritoneal signs, normal bowel sounds   Assessment and plan: 60 y.o. female with incidental duodenal nodule  For upper EUS evaluation today.   Owens Loffler, MD [facility] 03/18/2016, 7:18 AM

## 2016-03-18 NOTE — Transfer of Care (Signed)
Immediate Anesthesia Transfer of Care Note  Patient: Emma Wilson  Procedure(s) Performed: Procedure(s): UPPER ENDOSCOPIC ULTRASOUND (EUS) RADIAL (N/A)  Patient Location: PACU  Anesthesia Type:MAC  Level of Consciousness:  sedated, patient cooperative and responds to stimulation  Airway & Oxygen Therapy:Patient Spontanous Breathing and Patient connected to face mask oxgen  Post-op Assessment:  Report given to PACU RN and Post -op Vital signs reviewed and stable  Post vital signs:  Reviewed and stable  Last Vitals:  Filed Vitals:   03/18/16 0641  BP: 135/87  Pulse: 57  Temp: 36.7 C  Resp: 10    Complications: No apparent anesthesia complications

## 2016-03-21 ENCOUNTER — Encounter (HOSPITAL_COMMUNITY): Payer: Self-pay | Admitting: Gastroenterology

## 2016-06-08 ENCOUNTER — Encounter: Payer: Self-pay | Admitting: Medical

## 2016-06-08 ENCOUNTER — Ambulatory Visit (INDEPENDENT_AMBULATORY_CARE_PROVIDER_SITE_OTHER): Payer: 59 | Admitting: Medical

## 2016-06-08 VITALS — BP 102/62 | HR 56 | Wt 137.0 lb

## 2016-06-08 DIAGNOSIS — R319 Hematuria, unspecified: Secondary | ICD-10-CM | POA: Diagnosis not present

## 2016-06-08 DIAGNOSIS — R109 Unspecified abdominal pain: Secondary | ICD-10-CM

## 2016-06-08 DIAGNOSIS — M797 Fibromyalgia: Secondary | ICD-10-CM | POA: Diagnosis not present

## 2016-06-08 DIAGNOSIS — R8290 Unspecified abnormal findings in urine: Secondary | ICD-10-CM | POA: Diagnosis not present

## 2016-06-08 LAB — POCT URINALYSIS DIPSTICK
Bilirubin, UA: NEGATIVE
Glucose, UA: NEGATIVE
KETONES UA: NEGATIVE
NITRITE UA: NEGATIVE
PH UA: 7.5
PROTEIN UA: NEGATIVE
Spec Grav, UA: 1.015
Urobilinogen, UA: NEGATIVE

## 2016-06-08 MED ORDER — ATORVASTATIN CALCIUM 20 MG PO TABS: 20.0000 mg | ORAL_TABLET | Freq: Every day | ORAL | 0 refills | Status: DC

## 2016-06-08 MED ORDER — CIPROFLOXACIN HCL 500 MG PO TABS: 500.0000 mg | ORAL_TABLET | Freq: Two times a day (BID) | ORAL | 0 refills | Status: DC

## 2016-06-08 NOTE — Progress Notes (Signed)
Subjective: Chief Complaint  Patient presents with  . Hematuria    states her urine was dark "like i hadnt drunk water in years" lower abdominal pain.    Here for blood in urine.    She thinks she has blood in urine, having some pain in lower abdomen.  Urine looks dark yellow.  Not usually that color.   Been seeing this for a week.  Not urinating more than usual,no urgency.   Has back pain, attributes this to fibromyalgia.   No odor or cloudiness to the urine.   Last UTI unsure.   No vaginal discharge.    Thought it may be some hip pain, found out one leg is shorter than the other. No concern for STD.  No recent trauma, no strenuous exercise.   Still smoking.  Has hx/o chronic hematuria, prior eval urology 2014.   Denies blood in stool, no loose stool, does have some nausea.  No fever.     Not taking cholesterol pill.  Thinks one of [place] told her to stop this.  Wants to clarify.    Past Medical History:  Diagnosis Date  . Abnormality of gait 03/19/2014   one leg shorter than other"uses cane frequently"  . Carpal tunnel syndrome    right wrist  . Chronic back pain   . COPD (chronic obstructive pulmonary disease) ([place])   . Depression 2006   "after my son passed" (12/05/2013)  . Fibromyalgia   . GERD (gastroesophageal reflux disease)   . Hematuria    microscopic hematuria (urol & nephrol w/u neg), multiple UTI`s, glomerular from Sickle Trait, Dr. Jeffie Pollock 2014  . Hyperlipidemia    managed by Dr. Ouida Sills  . Hypothyroidism   . Migraine    "q 3-4 months" (12/05/2013)  . Osteoarthritis    Rheumatoid and OA?  sees Dr. Ouida Sills [facility] medical, rheumatology)  . Pneumonia    "abuntantly as a child"  . Rheumatoid arthritis ([place])   . Shortness of breath    "at rest" (12/05/2013)  . Sinus bradycardia   . Thyroid cancer ([place])    S/P radiation & OR   Past Surgical History:  Procedure Laterality Date  . COLONOSCOPY  10/13/2010   external and internal hemorrhoids, repeat 2021; Dr. Clarene Essex  .  CYSTOSCOPY  02/26/2013   Dr. Jeffie Pollock (normal; follicular cystitis)  . EUS N/A 03/18/2016   Procedure: UPPER ENDOSCOPIC ULTRASOUND (EUS) RADIAL;  Surgeon: Milus Banister, MD;  Location: [facility] ENDOSCOPY;  Service: Endoscopy;  Laterality: N/A;  . THYROIDECTOMY, PARTIAL  11/2005; 11/2006  . TOTAL ABDOMINAL HYSTERECTOMY  ~ 1990   due to bleeding  . TUBAL LIGATION  1980's    ROS as in subjective   Objective: BP 102/62   Pulse (!) 56   Wt 137 lb (62.1 kg)   BMI 22.11 kg/m   Gen: wd, wn, nad Lungs clear Heart RRR, normal s1, s2, no murmurs Abdomen: +bs, soft, mild to moderate tenderness suprapubic and LLQ, otherwise nonntender, no mass, no organomegaly Back: tender throughout No rash   Assessment: Encounter Diagnoses  Name Primary?  . Hematuria Yes  . Abdominal discomfort   . Fibromyalgia   . Abnormal urinalysis     Plan: Likely UTI given symptoms, UA.  Begin Cipro, urine sent for culture.   advised increased water intake, cranberry juice the next few days.   If worse or not improving in the next few days, call or return.  If loose stool or other new symptoms recheck.  Return soon for physical and fasting labs  Gracious was seen today for hematuria.  Diagnoses and all orders for this visit:  Hematuria -     POCT urinalysis dipstick -     Urine culture -     ciprofloxacin (CIPRO) 500 MG tablet; Take 1 tablet (500 mg total) by mouth 2 (two) times daily.  Abdominal discomfort -     Urine culture -     ciprofloxacin (CIPRO) 500 MG tablet; Take 1 tablet (500 mg total) by mouth 2 (two) times daily.  Fibromyalgia  Abnormal urinalysis -     Urine culture -     ciprofloxacin (CIPRO) 500 MG tablet; Take 1 tablet (500 mg total) by mouth 2 (two) times daily.

## 2016-06-08 NOTE — Addendum Note (Signed)
Addended by: Carlena Hurl on: 06/08/2016 12:53 PM   Modules accepted: Orders

## 2016-06-10 LAB — URINE CULTURE: ORGANISM ID, BACTERIA: NO GROWTH

## 2016-07-06 ENCOUNTER — Emergency Department (HOSPITAL_COMMUNITY): Payer: 59

## 2016-07-06 ENCOUNTER — Emergency Department (HOSPITAL_COMMUNITY)
Admission: EM | Admit: 2016-07-06 | Discharge: 2016-07-06 | Disposition: A | Discharge status: Home / Self Care | Payer: 59 | Attending: Emergency Medicine | Admitting: Emergency Medicine

## 2016-07-06 ENCOUNTER — Encounter (HOSPITAL_COMMUNITY): Payer: Self-pay | Admitting: *Deleted

## 2016-07-06 DIAGNOSIS — Z79899 Other long term (current) drug therapy: Secondary | ICD-10-CM | POA: Diagnosis not present

## 2016-07-06 DIAGNOSIS — F17210 Nicotine dependence, cigarettes, uncomplicated: Secondary | ICD-10-CM | POA: Insufficient documentation

## 2016-07-06 DIAGNOSIS — Z85850 Personal history of malignant neoplasm of thyroid: Secondary | ICD-10-CM | POA: Insufficient documentation

## 2016-07-06 DIAGNOSIS — J449 Chronic obstructive pulmonary disease, unspecified: Secondary | ICD-10-CM | POA: Insufficient documentation

## 2016-07-06 DIAGNOSIS — E039 Hypothyroidism, unspecified: Secondary | ICD-10-CM | POA: Diagnosis not present

## 2016-07-06 DIAGNOSIS — Z792 Long term (current) use of antibiotics: Secondary | ICD-10-CM | POA: Insufficient documentation

## 2016-07-06 DIAGNOSIS — M5440 Lumbago with sciatica, unspecified side: Secondary | ICD-10-CM | POA: Insufficient documentation

## 2016-07-06 DIAGNOSIS — M25551 Pain in right hip: Secondary | ICD-10-CM | POA: Diagnosis present

## 2016-07-06 DIAGNOSIS — R109 Unspecified abdominal pain: Secondary | ICD-10-CM | POA: Diagnosis not present

## 2016-07-06 DIAGNOSIS — M545 Low back pain: Secondary | ICD-10-CM

## 2016-07-06 MED ORDER — MELOXICAM 7.5 MG PO TABS: 7.5000 mg | ORAL_TABLET | Freq: Every day | ORAL | 0 refills | Status: DC

## 2016-07-06 MED ORDER — METHOCARBAMOL 500 MG PO TABS: 500.0000 mg | ORAL_TABLET | Freq: Three times a day (TID) | ORAL | 0 refills | Status: DC | PRN

## 2016-07-06 MED ORDER — KETOROLAC TROMETHAMINE 60 MG/2ML IM SOLN
60.0000 mg | Freq: Once | INTRAMUSCULAR | Status: AC
  Administered 2016-07-06: 60 mg via INTRAMUSCULAR
  Filled 2016-07-06: qty 2

## 2016-07-06 NOTE — Discharge Instructions (Signed)
Read the information below.  Use the prescribed medication as directed.  Please discuss all new medications with your pharmacist.  You may return to the Emergency Department at any time for worsening condition or any new symptoms that concern you.    If you develop fevers, loss of control of bowel or bladder, weakness or numbness in your legs, or are unable to walk, return to the ER for a recheck.  °

## 2016-07-06 NOTE — ED Triage Notes (Addendum)
Pt complains of pain in her right lower hip since 10AM this morning. Pt states the pain began while she was sitting down at work. Pt states she tried tramadol, lidocaine patch and biofreeze, which did not provide relief. Pt denies fall, states pain is worse with movement. Pt has taken a few steps since pain began.

## 2016-07-06 NOTE — ED Provider Notes (Signed)
[facility] DEPT Provider Note   CSN: [id number] Arrival date & time: 07/06/16  1156  By signing my name below, I, R[employer], attest that this documentation has been prepared under the direction and in the presence of [facility], PA-C. Electronically Signed: Judithe Modest, ER Scribe. 06/26/2016. 2:52 PM.   History   Chief Complaint Chief Complaint  Patient presents with  . Hip Pain    The history is provided by the patient. No language interpreter was used.    HPI Comments: Emma Wilson is a 60 y.o. female with a past hx of fibromyalgia who presents to [facility] complaining of severe right hip pain for the last 6 hours. She is also complaining of mild possibly referred pain in her right groin. The pain only hurts with movement and palpation.  Denies any injury, trauma, change in activity recently.  She was sitting in a chair when the pain began.  She denies recent fevers, abdominal pain, urinary, vaginal, or bowel symtpoms.  Denis weakness or numbness of the lower extremities.   Took tramadol and lidocaine patch that she has for fibromyalgia.   Has hx kidney stone, does not feel similar.     Past Medical History:  Diagnosis Date  . Abnormality of gait 03/19/2014   one leg shorter than other"uses cane frequently"  . Carpal tunnel syndrome    right wrist  . Chronic back pain   . COPD (chronic obstructive pulmonary disease) ([place])   . Depression 2006   "after my son passed" (12/05/2013)  . Fibromyalgia   . GERD (gastroesophageal reflux disease)   . Hematuria    microscopic hematuria (urol & nephrol w/u neg), multiple UTI`s, glomerular from [facility], Dr. Jeffie Pollock 2014  . Hyperlipidemia    managed by Dr. Ouida Sills  . Hypothyroidism   . Migraine    "q 3-4 months" (12/05/2013)  . Osteoarthritis    Rheumatoid and OA?  sees Dr. Ouida Sills [facility] medical, rheumatology)  . Pneumonia    "abuntantly as a child"  . Rheumatoid arthritis ([place])   . Shortness of breath    "at rest"  (12/05/2013)  . Sinus bradycardia   . Thyroid cancer ([place])    S/P radiation & OR    Patient Active Problem List   Diagnosis Date Noted  . History of smoking 12/04/2015  . Abnormality of gait 03/19/2014  . Chest pain 12/05/2013  . Pure hypercholesterolemia 01/24/2013  . Hematuria 01/24/2013  . Fibromyalgia 01/24/2013  . Bradycardia 03/25/2011  . TOBACCO ABUSE 02/12/2010  . THYROID CANCER 02/11/2010  . HYPOTHYROIDISM 02/11/2010  . DEPRESSION 02/11/2010  . SINUS BRADYCARDIA 02/11/2010  . COPD 02/11/2010  . GERD 02/11/2010  . DIZZINESS 02/11/2010  . CHEST PAIN 02/11/2010    Past Surgical History:  Procedure Laterality Date  . COLONOSCOPY  10/13/2010   external and internal hemorrhoids, repeat 2021; Dr. Clarene Essex  . CYSTOSCOPY  02/26/2013   Dr. Jeffie Pollock (normal; follicular cystitis)  . EUS N/A 03/18/2016   Procedure: UPPER ENDOSCOPIC ULTRASOUND (EUS) RADIAL;  Surgeon: Milus Banister, MD;  Location: [facility] ENDOSCOPY;  Service: Endoscopy;  Laterality: N/A;  . THYROIDECTOMY, PARTIAL  11/2005; 11/2006  . TOTAL ABDOMINAL HYSTERECTOMY  ~ 1990   due to bleeding  . TUBAL LIGATION  1980's    OB History    No data available       Home Medications    Prior to Admission medications   Medication Sig Start Date End Date Taking? Authorizing Provider  albuterol (  PROAIR HFA) 108 (90 Base) MCG/ACT inhaler Inhale 2 puffs into the lungs every 6 (six) hours as needed for wheezing or shortness of breath. Patient not taking: Reported on 06/08/2016 01/14/16   David S Tysinger, PA-C  Ascorbic Acid (VITAMIN C) 1000 MG tablet Take 1,000 mg by mouth daily.    Historical Provider, MD  atorvastatin (LIPITOR) 20 MG tablet Take 1 tablet (20 mg total) by mouth daily. 06/08/16   David S Tysinger, PA-C  calcium gluconate 500 MG tablet Take 500 mg by mouth daily.    Historical Provider, MD  cholecalciferol (VITAMIN D) 1000 UNITS tablet Take 2,000 Units by mouth daily.     Historical Provider, MD  ciprofloxacin  (CIPRO) 500 MG tablet Take 1 tablet (500 mg total) by mouth 2 (two) times daily. 06/08/16   David S Tysinger, PA-C  dexlansoprazole (DEXILANT) 60 MG capsule Take 1 capsule (60 mg total) by mouth daily. 12/04/15   David S Tysinger, PA-C  diclofenac sodium (VOLTAREN) 1 % GEL Apply 1 application topically 3 (three) times daily as needed (For pain.).     Historical Provider, MD  gabapentin (NEURONTIN) 100 MG capsule Take 100 mg by mouth 2 (two) times daily.     Historical Provider, MD  levothyroxine (SYNTHROID) 150 MCG tablet Take 1 tablet (150 mcg total) by mouth daily before breakfast. 08/05/15   David S Tysinger, PA-C  meloxicam (MOBIC) 7.5 MG tablet Take 1 tablet (7.5 mg total) by mouth daily. 07/06/16   Emily West, PA-C  methocarbamol (ROBAXIN) 500 MG tablet Take 1-2 tablets (500-1,000 mg total) by mouth every 8 (eight) hours as needed (pain). 07/06/16   Emily West, PA-C  Multiple Vitamin (MULTIVITAMIN) tablet Take 1 tablet by mouth daily.    Historical Provider, MD  traMADol (ULTRAM) 50 MG tablet Take 50 mg by mouth every 8 (eight) hours as needed for moderate pain. Reported on 01/14/2016    Historical Provider, MD    Family History Family History  Problem Relation Age of Onset  . Stroke Mother   . Coronary artery disease Mother   . Kidney disease Mother     on dialysis  . Heart disease Mother   . Cancer Father     multiple myeloma  . Arthritis Sister     rheumatoid and osteoarthritis  . Fibromyalgia Sister   . Arthritis Sister     RA and OA  . Diabetes Son     Social History Social History  Substance Use Topics  . Smoking status: Current Every Day Smoker    Packs/day: 1.00    Years: 34.00    Types: Cigarettes  . Smokeless tobacco: Never Used  . Alcohol use 0.0 oz/week     Comment: once to twice a week      Allergies   Review of patient's allergies indicates no known allergies.   Review of Systems Review of Systems  Constitutional: Negative for chills and fever.    Cardiovascular: Negative for leg swelling.  Gastrointestinal: Positive for abdominal pain. Negative for blood in stool, constipation, diarrhea, nausea and vomiting.  Genitourinary: Negative for dysuria, frequency and urgency.  Musculoskeletal: Positive for arthralgias, gait problem and myalgias.  Skin: Negative for color change.  Neurological: Positive for weakness. Negative for numbness.  Psychiatric/Behavioral: Negative for self-injury.   Physical Exam Updated Vital Signs BP 138/79 (BP Location: Right Arm)   Pulse 60   Temp 98.1 F (36.7 C) (Oral)   Resp 18   Ht 5' 7" (1.702 m)     Wt 64.4 kg   SpO2 95%   BMI 22.24 kg/m   Physical Exam  Constitutional: She is oriented to person, place, and time. She appears well-developed and well-nourished. No distress.  HENT:  Head: Normocephalic and atraumatic.  Neck: Neck supple.  Cardiovascular: Normal rate.   Pulmonary/Chest: Effort normal. No respiratory distress.  Musculoskeletal:       Back:  Mild diffuse tenderness throughout spine that pt states is her "normal pain."  Tenderness over right buttock and iliac crest.  Lidoderm patch in place.  No erythema, warmth, tenderness noted.   Lower extremities:  Strength 5/5, sensation intact, distal pulses intact.   No lower extremity edema.    Neurological: She is alert and oriented to person, place, and time. Coordination normal.  Skin: She is not diaphoretic.  Nursing note and vitals reviewed.  ED Treatments / Results  DIAGNOSTIC STUDIES: Oxygen Saturation is 95% on RA, adequate by my interpretation.    COORDINATION OF CARE: 4:22 PM Discussed treatment plan with pt at bedside which includes xray, pain medication, and pt agreed to plan.  Labs (all labs ordered are listed, but only abnormal results are displayed) Labs Reviewed - No data to display  EKG  EKG Interpretation None       Radiology Dg Hip Unilat W Or Wo Pelvis 2-3 Views Right  Result Date: 07/06/2016 CLINICAL  DATA:  Right hip pain, no known injury EXAM: DG HIP (WITH OR WITHOUT PELVIS) 2-3V RIGHT COMPARISON:  None. FINDINGS: Three views of the right hip submitted. No acute fracture or subluxation. Bilateral hip joints are symmetrical in appearance. Mild degenerative changes pubic symphysis. IMPRESSION: No acute fracture or subluxation. Mild degenerative changes pubic symphysis. Electronically Signed   By: Lahoma Crocker M.D.   On: 07/06/2016 15:09    Procedures Procedures (including critical care time)  Medications Ordered in ED Medications  ketorolac (TORADOL) injection 60 mg (60 mg Intramuscular Given 07/06/16 1533)     Initial Impression / Assessment and Plan / ED Course  I have reviewed the triage vital signs and the nursing notes.  Pertinent labs & imaging results that were available during my care of the patient were reviewed by me and considered in my medical decision making (see chart for details).  Clinical Course   Afebrile, nontoxic patient with fibromyalgia, chronic back pain p/w right hip pain that is atraumatic.  Clinically doubt septic joint.  Pt denies any systemic symptoms or abdominal, GI/GU symptoms that would be concerning for alternate etiology.  Neurovascularly intact.  No red flags.  Xray negative.  Given location of pain and pain with movement and palpation only, suspect low back muscular vs sciatic pain.   D/C home with symptomatic medications, advised close PCP follow up, also discussed return precautions.  Discussed result, findings, treatment, and follow up  with patient.  Pt given return precautions.  Pt verbalizes understanding and agrees with plan.       Final Clinical Impressions(s) / ED Diagnoses   Final diagnoses:  Right low back pain, with sciatica presence unspecified    New Prescriptions New Prescriptions   MELOXICAM (MOBIC) 7.5 MG TABLET    Take 1 tablet (7.5 mg total) by mouth daily.   METHOCARBAMOL (ROBAXIN) 500 MG TABLET    Take 1-2 tablets (500-1,000 mg  total) by mouth every 8 (eight) hours as needed (pain).    I personally performed the services described in this documentation, which was scribed in my presence. The recorded information has been reviewed and  is accurate.      Emily West, PA-C 07/06/16 1622    Kevin Steinl, MD 07/08/16 1008  

## 2016-07-26 ENCOUNTER — Telehealth: Payer: Self-pay | Admitting: Medical

## 2016-07-26 ENCOUNTER — Ambulatory Visit (INDEPENDENT_AMBULATORY_CARE_PROVIDER_SITE_OTHER): Payer: 59 | Admitting: Medical

## 2016-07-26 ENCOUNTER — Encounter: Payer: Self-pay | Admitting: Medical

## 2016-07-26 VITALS — BP 120/70 | HR 64 | Resp 16 | Ht 66.0 in | Wt 138.0 lb

## 2016-07-26 DIAGNOSIS — K219 Gastro-esophageal reflux disease without esophagitis: Secondary | ICD-10-CM | POA: Diagnosis not present

## 2016-07-26 DIAGNOSIS — E7800 Pure hypercholesterolemia, unspecified: Secondary | ICD-10-CM | POA: Diagnosis not present

## 2016-07-26 DIAGNOSIS — F17200 Nicotine dependence, unspecified, uncomplicated: Secondary | ICD-10-CM

## 2016-07-26 DIAGNOSIS — Z0000 Encounter for general adult medical examination without abnormal findings: Secondary | ICD-10-CM | POA: Insufficient documentation

## 2016-07-26 DIAGNOSIS — R001 Bradycardia, unspecified: Secondary | ICD-10-CM

## 2016-07-26 DIAGNOSIS — E890 Postprocedural hypothyroidism: Secondary | ICD-10-CM | POA: Diagnosis not present

## 2016-07-26 DIAGNOSIS — Z113 Encounter for screening for infections with a predominantly sexual mode of transmission: Secondary | ICD-10-CM

## 2016-07-26 DIAGNOSIS — R0989 Other specified symptoms and signs involving the circulatory and respiratory systems: Secondary | ICD-10-CM | POA: Diagnosis not present

## 2016-07-26 DIAGNOSIS — Z8709 Personal history of other diseases of the respiratory system: Secondary | ICD-10-CM

## 2016-07-26 DIAGNOSIS — R269 Unspecified abnormalities of gait and mobility: Secondary | ICD-10-CM

## 2016-07-26 DIAGNOSIS — G609 Hereditary and idiopathic neuropathy, unspecified: Secondary | ICD-10-CM | POA: Diagnosis not present

## 2016-07-26 DIAGNOSIS — Z1159 Encounter for screening for other viral diseases: Secondary | ICD-10-CM

## 2016-07-26 DIAGNOSIS — Z23 Encounter for immunization: Secondary | ICD-10-CM | POA: Diagnosis not present

## 2016-07-26 DIAGNOSIS — M797 Fibromyalgia: Secondary | ICD-10-CM

## 2016-07-26 LAB — CBC
HEMATOCRIT: 38.9 % (ref 35.0–45.0)
HEMOGLOBIN: 13.2 g/dL (ref 11.7–15.5)
MCH: 26.7 pg — ABNORMAL LOW (ref 27.0–33.0)
MCHC: 33.9 g/dL (ref 32.0–36.0)
MCV: 78.7 fL — ABNORMAL LOW (ref 80.0–100.0)
MPV: 10.3 fL (ref 7.5–12.5)
Platelets: 277 10*3/uL (ref 140–400)
RBC: 4.94 MIL/uL (ref 3.80–5.10)
RDW: 14.1 % (ref 11.0–15.0)
WBC: 4.6 10*3/uL (ref 4.0–10.5)

## 2016-07-26 LAB — COMPREHENSIVE METABOLIC PANEL
ALK PHOS: 89 U/L (ref 33–130)
ALT: 13 U/L (ref 6–29)
AST: 15 U/L (ref 10–35)
Albumin: 4 g/dL (ref 3.6–5.1)
BILIRUBIN TOTAL: 0.8 mg/dL (ref 0.2–1.2)
BUN: 12 mg/dL (ref 7–25)
CALCIUM: 9.5 mg/dL (ref 8.6–10.4)
CO2: 28 mmol/L (ref 20–31)
CREATININE: 0.66 mg/dL (ref 0.50–1.05)
Chloride: 107 mmol/L (ref 98–110)
GLUCOSE: 87 mg/dL (ref 65–99)
Potassium: 3.9 mmol/L (ref 3.5–5.3)
SODIUM: 141 mmol/L (ref 135–146)
Total Protein: 7.2 g/dL (ref 6.1–8.1)

## 2016-07-26 LAB — LIPID PANEL
Cholesterol: 235 mg/dL — ABNORMAL HIGH (ref 125–200)
HDL: 46 mg/dL (ref >=46)
LDL CALC: 168 mg/dL — AB (ref <130)
Total CHOL/HDL Ratio: 5.1 Ratio — ABNORMAL HIGH (ref <=5.0)
Triglycerides: 104 mg/dL (ref <150)
VLDL: 21 mg/dL (ref <30)

## 2016-07-26 LAB — POCT URINALYSIS DIPSTICK
BILIRUBIN UA: NEGATIVE
Glucose, UA: NEGATIVE
Ketones, UA: NEGATIVE
LEUKOCYTES UA: NEGATIVE
NITRITE UA: NEGATIVE
PH UA: 6
PROTEIN UA: NEGATIVE
Spec Grav, UA: >=1.03
UROBILINOGEN UA: NEGATIVE

## 2016-07-26 LAB — HIV ANTIBODY (ROUTINE TESTING W REFLEX): HIV 1&2 Ab, 4th Generation: NONREACTIVE

## 2016-07-26 LAB — HEPATITIS C ANTIBODY: HCV AB: NEGATIVE

## 2016-07-26 NOTE — Telephone Encounter (Signed)
Set her up for ABIs blood flow screening for decreased pulses in right arm, both legs, and smoker  Also, remind her to check insurance coverage/copay for lung cancer CT screen and bone density scan.  I do think she should have a bone density screen.

## 2016-07-26 NOTE — Patient Instructions (Addendum)
Recommendations:'  See your eye doctor yearly for routine vision care.  See your dentist yearly for routine dental care including hygiene visits twice yearly.  We updated your Tdap (tetanus, diptheria, and acellular pertussis) vaccine today  We updated your influenza virus vaccine today  We will refer you for ABI blood flow screen.  This is a screen for peripheral vascular disease.  Check insurance coverage for a lung cancer screen Chest CT  Go for mammogram every 1- 2 years  We will call wit lab results  STOP SMOKING!!     YOU CAN QUIT SMOKING!  Talk to your medical provider about using medicines to help you quit. These include nicotine replacement gum, lozenges, or skin patches.  Consider calling 1-800-QUIT-NOW, a toll free 24/7 hotline with free counseling to help you quit.  If you are ready to quit smoking or are thinking about it, congratulations! You have chosen to help yourself be healthier and live longer! There are lots of different ways to quit smoking. Nicotine gum, nicotine patches, a nicotine inhaler, or nicotine nasal spray can help with physical craving. Hypnosis, support groups, and medicines help break the habit of smoking. TIPS TO GET OFF AND STAY OFF CIGARETTES  Learn to predict your moods. Do not let a bad situation be your excuse to have a cigarette. Some situations in your life might tempt you to have a cigarette.   Ask friends and co-workers not to smoke around you.   Make your home smoke-free.   Never have "just one" cigarette. It leads to wanting another and another. Remind yourself of your decision to quit.   On a card, make a list of your reasons for not smoking. Read it at least the same number of times a day as you have a cigarette. Tell yourself everyday, "I do not want to smoke. I choose not to smoke."   Ask someone at home or work to help you with your plan to quit smoking.   Have something planned after you eat or have a cup of coffee. Take a  walk or get other exercise to perk you up. This will help to keep you from overeating.   Try a relaxation exercise to calm you down and decrease your stress. Remember, you may be tense and nervous the first two weeks after you quit. This will pass.   Find new activities to keep your hands busy. Play with a pen, coin, or rubber band. Doodle or draw things on paper.   Brush your teeth right after eating. This will help cut down the craving for the taste of tobacco after meals. You can try mouthwash too.   Try gum, breath mints, or diet candy to keep something in your mouth.  IF YOU SMOKE AND WANT TO QUIT:  Do not stock up on cigarettes. Never buy a carton. Wait until one pack is finished before you buy another.   Never carry cigarettes with you at work or at home.   Keep cigarettes as far away from you as possible. Leave them with someone else.   Never carry matches or a lighter with you.   Ask yourself, "Do I need this cigarette or is this just a reflex?"   Bet with someone that you can quit. Put cigarette money in a piggy bank every morning. If you smoke, you give up the money. If you do not smoke, by the end of the week, you keep the money.   Keep trying. It takes 21 days  to change a habit!  Document Released: 08/28/2009 Document Revised: 07/14/2011 Document Reviewed: 08/28/2009 [facility] Patient Information 2012 [place].

## 2016-07-26 NOTE — Progress Notes (Signed)
Subjective:   HPI  Emma Wilson is a 60 y.o. female who presents for a complete physical.  Medical care team includes:  Rheumatology, Dr. Titus Mould  Dentist, eye doctor,   GI - Dr. Alen Blew, [facility], PA-C here for primary care   Concerns: Body aches, chronic back pains.   Every years gets harder to work day to day  Reviewed their medical, surgical, family, social, medication, and allergy history and updated chart as appropriate.  Past Medical History:  Diagnosis Date  . Abnormality of gait 03/19/2014   one leg shorter than other"uses cane frequently"  . Carpal tunnel syndrome    right wrist  . Chronic back pain    DDD  . COPD (chronic obstructive pulmonary disease) ([place])   . Depression 2006   "after my son passed" (12/05/2013)  . Fibromyalgia   . GERD (gastroesophageal reflux disease)   . Hematuria    microscopic hematuria (urol & nephrol w/u neg), multiple UTI`s, glomerular from [facility], Dr. Jeffie Pollock 2014  . Hyperlipidemia    managed by Dr. Ouida Sills  . Hypothyroidism   . Leg length inequality    sees rheumatology  . Migraine    "q 3-4 months" (12/05/2013)  . Osteoarthritis    Rheumatoid and OA?  Dr. Titus Mould [facility], rheumatology)  . Pneumonia    "abuntantly as a child"  . Rheumatoid arthritis (HCC)    Dr. Titus Mould  . Shortness of breath    "at rest" (12/05/2013)  . Sinus bradycardia   . Thyroid cancer ([place])    S/P radiation & OR    Past Surgical History:  Procedure Laterality Date  . COLONOSCOPY  10/13/2010   external and internal hemorrhoids, repeat 2021; Dr. Clarene Essex  . CYSTOSCOPY  02/26/2013   Dr. Jeffie Pollock (normal; follicular cystitis)  . EUS N/A 03/18/2016   Procedure: UPPER ENDOSCOPIC ULTRASOUND (EUS) RADIAL;  Surgeon: Milus Banister, MD;  Location: [facility];  Service: Endoscopy;  Laterality: N/A;  . THYROIDECTOMY, PARTIAL  11/2005; 11/2006  . TOTAL ABDOMINAL HYSTERECTOMY  ~ 1990   due to bleeding  . TUBAL LIGATION  1980's    Social  History   Social History  . Marital status: Divorced    Spouse name: N/A  . Number of children: 3  . 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 Smoking status: Current Every Day Smoker    Packs/day: 1.00    Years: 35.00    Types: Cigarettes  . Smokeless tobacco: Never Used  . Alcohol use 0.0 oz/week     Comment: once to twice a week   . Drug use: No  . Sexual activity: Not Currently    Partners: Male   Other Topics Concern  . Not on file   Social History Narrative   Lives alone.  2 living children (1 deceased), 1 in [place], 1 in [place], 6 grandchildren.  Works at [employer].   Exercise some.  As of 07/2016    Family History  Problem Relation Age of Onset  . Stroke Mother   . Coronary artery disease Mother   . Kidney disease Mother     on dialysis  . Heart disease Mother   . Cancer Father     multiple myeloma  . Arthritis Sister     rheumatoid and osteoarthritis  . Fibromyalgia Sister   . Arthritis Sister     RA and  OA  . Diabetes Son      Current Outpatient Prescriptions:  .  Ascorbic Acid (VITAMIN C) 1000 MG tablet, Take 1,000 mg by mouth daily., Disp: , Rfl:  .  atorvastatin (LIPITOR) 20 MG tablet, Take 1 tablet (20 mg total) by mouth daily., Disp: 90 tablet, Rfl: 0 .  calcium gluconate 500 MG tablet, Take 500 mg by mouth daily., Disp: , Rfl:  .  cholecalciferol (VITAMIN D) 1000 UNITS tablet, Take 2,000 Units by mouth daily. , Disp: , Rfl:  .  dexlansoprazole (DEXILANT) 60 MG capsule, Take 1 capsule (60 mg total) by mouth daily., Disp: 30 capsule, Rfl: 0 .  diclofenac sodium (VOLTAREN) 1 % GEL, Apply 1 application topically 3 (three) times daily as needed (For pain.). , Disp: , Rfl:  .  gabapentin (NEURONTIN) 100 MG capsule, Take 300 mg by mouth 2 (two) times daily. , Disp: , Rfl:  .  levothyroxine (SYNTHROID) 150 MCG tablet, Take 1 tablet (150 mcg total) by mouth daily before breakfast.,  Disp: 90 tablet, Rfl: 3 .  Multiple Vitamin (MULTIVITAMIN) tablet, Take 1 tablet by mouth daily., Disp: , Rfl:  .  naproxen (NAPROSYN) 500 MG tablet, Take 500 mg by mouth 2 (two) times daily with a meal., Disp: , Rfl:  .  traMADol (ULTRAM) 50 MG tablet, Take 50 mg by mouth every 8 (eight) hours as needed for moderate pain. Reported on 01/14/2016, Disp: , Rfl:  .  albuterol (PROAIR HFA) 108 (90 Base) MCG/ACT inhaler, Inhale 2 puffs into the lungs every 6 (six) hours as needed for wheezing or shortness of breath. (Patient not taking: Reported on 06/08/2016), Disp: 18 g, Rfl: 0  No Known Allergies    Review of Systems Constitutional: -fever, -chills, -sweats, -unexpected weight change, -decreased appetite, +fatigue Allergy: -sneezing, -itching, -congestion Dermatology: -changing moles, --rash, -lumps ENT: -runny nose, -ear pain, -sore throat, -hoarseness, -sinus pain, -teeth pain, - ringing in ears, -hearing loss, -nosebleeds Cardiology: -chest pain, -palpitations, -swelling, -difficulty breathing when lying flat, -waking up short of breath Respiratory: -cough, -shortness of breath, -difficulty breathing with exercise or exertion, -wheezing, -coughing up blood Gastroenterology: -abdominal pain, -nausea, -vomiting, -diarrhea, -constipation, -blood in stool, -changes in bowel movement, -difficulty swallowing or eating Hematology: -bleeding, -bruising  Musculoskeletal: +joint aches, -muscle aches, -joint swelling, +back pain, -neck pain, -cramping, -changes in gait Ophthalmology: denies vision changes, eye redness, itching, discharge Urology: -burning with urination, -difficulty urinating, -blood in urine, -urinary frequency, -urgency, -incontinence Neurology: -headache, -weakness, -tingling, -numbness, -memory loss, -falls, -dizziness Psychology: -depressed mood, -agitation, -sleep problems     Objective:   Physical Exam  BP 120/70   Pulse 64   Resp 16   Ht _0  (1.676 m)   Wt 138 lb (62.6  kg)   BMI 22.27 kg/m   General appearance: alert, no distress, WD/WN, AA female Skin: few scattered macules, no worrisome lesions HEENT: normocephalic, conjunctiva/corneas normal, sclerae anicteric, PERRLA, EOMi, nares patent, no discharge or erythema, pharynx normal Oral cavity: MMM, tongue normal, teeth upper denture, lower teeth in good repair Neck: supple, no lymphadenopathy, no thyromegaly, no masses, normal ROM, no bruits, anterior faint surgical scar Chest: non tender, normal shape and expansion Heart: RRR, normal S1, S2, no murmurs Lungs: CTA bilaterally, no wheezes, rhonchi, or rales Abdomen: +bs, soft, non tender, non distended, no masses, no hepatomegaly, no splenomegaly, no bruits Back: mild generalized lumbar tenderness, normal ROM, no scoliosis Musculoskeletal: upper extremities non tender, no obvious deformity, normal ROM throughout, lower extremities non  tender, no obvious deformity, normal ROM throughout Extremities: no edema, no cyanosis, no clubbing Pulses: 2+ symmetric, upper and lower extremities, normal cap refill Neurological: alert, oriented x 3, CN2-12 intact, strength normal upper extremities and lower extremities, sensation normal throughout, DTRs 2+ throughout, no cerebellar signs, gait normal Psychiatric: normal affect, behavior normal, pleasant  Breast: nontender, no masses or lumps, no skin changes, no nipple discharge or inversion, no axillary lymphadenopathy Gyn: Normal external genitalia without lesions, vagina with normal mucosa, s/p hysterectomy, nontender, no abnormal vaginal discharge.  Adnexa not enlarged, nontender, no masses.   Exam chaperoned by nurse. Rectal: anus normal tone, occult negative stool   Assessment and Plan :    Encounter Diagnoses  Name Primary?  . Encounter for health maintenance examination in adult Yes  . Need for prophylactic vaccination and inoculation against influenza   . Need for Tdap vaccination   . Gastroesophageal  reflux disease, esophagitis presence not specified   . Pure hypercholesterolemia   . Bradycardia   . TOBACCO ABUSE   . Abnormality of gait   . Fibromyalgia   . Postoperative hypothyroidism   . History of COPD   . Hereditary and idiopathic peripheral neuropathy   . Decreased pulses in feet   . Decreased radial pulse   . Screen for STD (sexually transmitted disease)   . Need for hepatitis C screening test     Physical exam - discussed healthy lifestyle, diet, exercise, preventative care, vaccinations, and addressed their concerns.   see your eye doctor yearly for routine vision care. See your dentist yearly for routine dental care including hygiene visits twice yearly. Counseled on the Tdap (tetanus, diptheria, and acellular pertussis) vaccine.  Vaccine information sheet given. Tdap vaccine given after consent obtained. Counseled on the influenza virus vaccine.  Vaccine information sheet given.  Influenza vaccine given after consent obtained. Will send for ABIs given tobacco use, leg pains, decreased pulses strongly advised she stop tobacco COPD - reviewed 11/2015 CXR and PFTs Advised mammogram q1-2 years Advised she check insurance coverage for Bone Density scan and CT chest lung cancer screen Osteoporosis screen questions show 6 risk factors including smoker, Vit D deficiency, RA, hx/o thyroid cancer treatment, falls, on GERD medication. We will request recent rheumatology records.   Joye was seen today for annual exam.  Diagnoses and all orders for this visit:  Encounter for health maintenance examination in adult -     Comprehensive metabolic panel -     Lipid panel -     CBC -     TSH -     T4, Free -     POCT urinalysis dipstick -     VITAMIN D 25 Hydroxy (Vit-D Deficiency, Fractures)  Need for prophylactic vaccination and inoculation against influenza -     Flu Vaccine QUAD 36+ mos PF IM (Fluarix & Fluzone Quad PF)  Need for Tdap vaccination -     Tdap vaccine  greater than or equal to 7yo IM  Gastroesophageal reflux disease, esophagitis presence not specified  Pure hypercholesterolemia -     Lipid panel  Bradycardia  TOBACCO ABUSE -     VAS [place] ABI WITH/WO TBI; Future  Abnormality of gait  Fibromyalgia -     VITAMIN D 25 Hydroxy (Vit-D Deficiency, Fractures)  Postoperative hypothyroidism -     TSH -     T4, Free  History of COPD  Hereditary and idiopathic peripheral neuropathy  Decreased pulses in feet -  VAS [place] ABI WITH/WO TBI; Future  Decreased radial pulse -     VAS [place] ABI WITH/WO TBI; Future  Screen for STD (sexually transmitted disease) -     HIV antibody -     RPR -     GC/Chlamydia Probe Amp -     Hepatitis C antibody  Need for hepatitis C screening test -     Hepatitis C antibody

## 2016-07-26 NOTE — Telephone Encounter (Signed)
Order entered.  appt scheduled for 07/28/16 @ 12:00PM, arrive at 11:45AM. [place]

## 2016-07-27 ENCOUNTER — Other Ambulatory Visit: Payer: Self-pay | Admitting: Medical

## 2016-07-27 ENCOUNTER — Telehealth: Payer: Self-pay | Admitting: Medical

## 2016-07-27 LAB — VITAMIN D 25 HYDROXY (VIT D DEFICIENCY, FRACTURES): Vit D, 25-Hydroxy: 34 ng/mL (ref 30–100)

## 2016-07-27 LAB — T4, FREE: FREE T4: 1.8 ng/dL (ref 0.8–1.8)

## 2016-07-27 LAB — GC/CHLAMYDIA PROBE AMP
CT PROBE, AMP APTIMA: NOT DETECTED
GC PROBE AMP APTIMA: NOT DETECTED

## 2016-07-27 LAB — TSH: TSH: 0.07 m[IU]/L — AB

## 2016-07-27 LAB — RPR

## 2016-07-27 MED ORDER — SYNTHROID 137 MCG PO TABS: 137.0000 ug | ORAL_TABLET | Freq: Every day | ORAL | 1 refills | Status: AC

## 2016-07-27 MED ORDER — DEXLANSOPRAZOLE 60 MG PO CPDR: 60.0000 mg | DELAYED_RELEASE_CAPSULE | Freq: Every day | ORAL | 3 refills | Status: DC

## 2016-07-27 MED ORDER — ATORVASTATIN CALCIUM 20 MG PO TABS: 20.0000 mg | ORAL_TABLET | Freq: Every day | ORAL | 3 refills | Status: DC

## 2016-07-27 NOTE — Telephone Encounter (Signed)
Pt notified of appt and location. She will call us after she checks with her insurance company on the CT and dexa.

## 2016-07-27 NOTE — Telephone Encounter (Signed)
Rcvd office note from [facility]

## 2016-07-28 ENCOUNTER — Encounter (HOSPITAL_COMMUNITY): Payer: Self-pay

## 2016-07-29 ENCOUNTER — Encounter (HOSPITAL_COMMUNITY): Payer: Self-pay

## 2016-07-29 ENCOUNTER — Ambulatory Visit (HOSPITAL_COMMUNITY)
Admission: RE | Admit: 2016-07-29 | Discharge: 2016-07-29 | Disposition: A | Discharge status: Home / Self Care | Payer: 59 | Source: Ambulatory Visit | Attending: Medical | Admitting: Medical

## 2016-07-29 DIAGNOSIS — F17200 Nicotine dependence, unspecified, uncomplicated: Secondary | ICD-10-CM

## 2016-07-29 DIAGNOSIS — R0989 Other specified symptoms and signs involving the circulatory and respiratory systems: Secondary | ICD-10-CM | POA: Diagnosis not present

## 2016-08-05 ENCOUNTER — Telehealth: Payer: Self-pay

## 2016-08-05 NOTE — Telephone Encounter (Signed)
Scan of report under Media. Please let me know what to inform pt.

## 2016-08-05 NOTE — Telephone Encounter (Signed)
Wants results of ABI results.  Pt can be reached at [phone] or [phone]. Victorino December

## 2016-08-05 NOTE — Telephone Encounter (Signed)
The ABI screening was normal, no major obvious blockage.  This is good news.   I do recommend regular exercise daily, along with other recommendations from her recent physical.     See if she called insurance about the other screens we discussed?

## 2016-08-06 NOTE — Telephone Encounter (Signed)
Pt was notified of results. Pt will call insurance to find out if her insurance covers the 2 screenings

## 2016-10-01 ENCOUNTER — Other Ambulatory Visit: Payer: Self-pay | Admitting: Medical

## 2017-03-01 ENCOUNTER — Telehealth: Payer: Self-pay | Admitting: Student

## 2017-03-01 NOTE — Telephone Encounter (Signed)
Received records from [facility] for appointment on 03/09/17 with Bernerd Pho, [place].  Records put with Brittany's schedule for 03/09/17. lp

## 2017-03-08 NOTE — Progress Notes (Signed)
Cardiology Office Note    Date:  03/09/2017   ID:  Emma Wilson, DOB 04-03-1956, MRN [id number]  PCP:  Merrilee Seashore, MD  Cardiologist: Dr. Stanford Breed   Chief Complaint  Patient presents with  . Bradycardia    unable to determine if she is having any Sx  . Fall    PCP wanted to make sure heart condition didin't cause fall 2 weeks ago , chin was injured     History of Present Illness:    Emma Wilson is a 60 y.o. female with past medical history of chest pain (normal ETT in 01/2014), HLD, Hypothyroidism, Fibromyalgia, GERD, and tobacco use who presents to the office today for evaluation of a syncopal event.  She was last seen by Dr. Stanford Breed in 11/2015 and reported occasional pain along her left axilla which would occur at rest or with exertion, lasting for several minutes then spontaneously resolving. Her symptoms were thought to be atypical for a cardiac etiology and further ischemic testing was not pursued at that time.   Was examined by her PCP on 02/24/2017 and reported a recent syncopal episode, therefore Cardiology follow-up was recommended.    In talking with the patient today, she reports that approximately 2 weeks ago she awoke during the middle the night to go to the restroom. She awoke on the floor in her hallway and believes she lost consciousness for only a few minutes. This occurred prior to her going to the restroom. She denies any prodromal symptoms such as lightheadedness, dizziness, palpitations, chest pain, or dyspnea. She denies any bladder or bowel incontinence with the episode. She did scrape her chin on the carpet but did not sustain any significant injuries.  She denies any repeat episodes of syncope since. She had not experienced any syncopal episodes prior to this. She does report occasional palpitations and dizziness but reports this is rare.   Past Medical History:  Diagnosis Date  . Abnormality of gait 03/19/2014   one leg shorter than  other"uses cane frequently"  . Carpal tunnel syndrome    right wrist  . Chronic back pain    DDD  . COPD (chronic obstructive pulmonary disease) ([place])   . Depression 2006   "after my son passed" (12/05/2013)  . Fibromyalgia   . GERD (gastroesophageal reflux disease)   . Hematuria    microscopic hematuria (urol & nephrol w/u neg), multiple UTI`s, glomerular from Sickle Trait, Dr. Jeffie Pollock 2014  . Hyperlipidemia    managed by Dr. Ouida Sills  . Hypothyroidism   . Leg length inequality    sees rheumatology  . Migraine    "q 3-4 months" (12/05/2013)  . Osteoarthritis    Rheumatoid and OA?  Dr. Titus Mould [facility] medical, rheumatology)  . Pneumonia    "abuntantly as a child"  . Rheumatoid arthritis (HCC)    Dr. Titus Mould  . Shortness of breath    "at rest" (12/05/2013)  . Sinus bradycardia   . Thyroid cancer ([place])    S/P radiation & OR    Past Surgical History:  Procedure Laterality Date  . COLONOSCOPY  10/13/2010   external and internal hemorrhoids, repeat 2021; Dr. Clarene Essex  . CYSTOSCOPY  02/26/2013   Dr. Jeffie Pollock (normal; follicular cystitis)  . EUS N/A 03/18/2016   Procedure: UPPER ENDOSCOPIC ULTRASOUND (EUS) RADIAL;  Surgeon: Milus Banister, MD;  [facility];  Service: Endoscopy;  Laterality: N/A;  . THYROIDECTOMY, PARTIAL  11/2005; 11/2006  . TOTAL ABDOMINAL HYSTERECTOMY  ~ 1990  due to bleeding  . TUBAL LIGATION  1980's    Current Medications: Outpatient Medications Prior to Visit  Medication Sig Dispense Refill  . albuterol (PROAIR HFA) 108 (90 Base) MCG/ACT inhaler Inhale 2 puffs into the lungs every 6 (six) hours as needed for wheezing or shortness of breath. 18 g 0  . Ascorbic Acid (VITAMIN C) 1000 MG tablet Take 1,000 mg by mouth daily.    Marland Kitchen atorvastatin (LIPITOR) 20 MG tablet Take 1 tablet (20 mg total) by mouth daily. 90 tablet 3  . calcium gluconate 500 MG tablet Take 500 mg by mouth daily.    . cholecalciferol (VITAMIN D) 1000 UNITS tablet Take 2,000 Units by mouth  daily.     Marland Kitchen dexlansoprazole (DEXILANT) 60 MG capsule Take 1 capsule (60 mg total) by mouth daily. 90 capsule 3  . diclofenac sodium (VOLTAREN) 1 % GEL Apply 1 application topically 3 (three) times daily as needed (For pain.).     Marland Kitchen gabapentin (NEURONTIN) 100 MG capsule Take 300 mg by mouth 2 (two) times daily.     . Multiple Vitamin (MULTIVITAMIN) tablet Take 1 tablet by mouth daily.    . naproxen (NAPROSYN) 500 MG tablet Take 500 mg by mouth 2 (two) times daily with a meal.    . SYNTHROID 137 MCG tablet Take 1 tablet (137 mcg total) by mouth daily before breakfast. 90 tablet 1  . traMADol (ULTRAM) 50 MG tablet Take 50 mg by mouth every 8 (eight) hours as needed for moderate pain. Reported on 01/14/2016     No facility-administered medications prior to visit.      Allergies:   Patient has no known allergies.   Social History   Social History  . Marital status: Divorced    Spouse name: N/A  . Number of children: 3  . 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 Smoking status: Current Every Day Smoker    Packs/day: 1.00    Years: 35.00    Types: Cigarettes  . Smokeless tobacco: Never Used  . Alcohol use 0.0 oz/week     Comment: once to twice a week   . Drug use: No  . Sexual activity: Not Currently    Partners: Male   Other Topics Concern  . None   Social History Narrative   Lives alone.  2 living children (1 deceased), 1 in [place], 1 in [place], 6 grandchildren.  Works at [employer].   Exercise some.  As of 07/2016     Family History:  The patient's family history includes Arthritis in her sister and sister; Cancer in her father; Coronary artery disease in her mother; Diabetes in her son; Fibromyalgia in her sister; Heart disease in her mother; Kidney disease in her mother; Stroke in her mother.   Review of Systems:   Please see the history of present illness.     General:  No chills, fever, night  sweats or weight changes. Positive for syncope.  Cardiovascular:  No chest pain, dyspnea on exertion, edema, orthopnea, paroxysmal nocturnal dyspnea. Positive for palpitations.  Dermatological: No rash, lesions/masses Respiratory: No cough, dyspnea Urologic: No hematuria, dysuria Abdominal:   No nausea, vomiting, diarrhea, bright red blood per rectum, melena, or hematemesis Neurologic:  No visual changes, wkns, changes in mental status. All other systems reviewed and are otherwise negative except as noted above.   Physical Exam:    VS:  BP 118/76  Pulse (!) 58   Ht 5\' 7"  (1.702 m)   Wt 145 lb 4 oz (65.9 kg)   SpO2 97%   BMI 22.75 kg/m    General: Well developed, well nourished [place] American female appearing in no acute distress. Head: Normocephalic, atraumatic, sclera non-icteric, no xanthomas, nares are without discharge.  Neck: No carotid bruits. JVD not elevated.  Lungs: Respirations regular and unlabored, without wheezes or rales.  Heart: Regular rate and rhythm. No S3 or S4.  No murmur, no rubs, or gallops appreciated. Abdomen: Soft, non-tender, non-distended with normoactive bowel sounds. No hepatomegaly. No rebound/guarding. No obvious abdominal masses. Msk:  Strength and tone appear normal for age. No joint deformities or effusions. Extremities: No clubbing or cyanosis. No lower extremity edema.  Distal pedal pulses are 2+ bilaterally. Neuro: Alert and oriented X 3. Moves all extremities spontaneously. No focal deficits noted. Psych:  Responds to questions appropriately with a normal affect. Skin: No rashes or lesions noted  Wt Readings from Last 3 Encounters:  03/09/17 145 lb 4 oz (65.9 kg)  07/26/16 138 lb (62.6 kg)  07/06/16 142 lb (64.4 kg)    Studies/Labs Reviewed:   EKG:  EKG is not ordered today.  Obtained by PCP two weeks ago and "normal" per report. Patient declines repeat EKG.   Recent Labs: 07/26/2016: ALT 13; BUN 12; Creat 0.66; Hemoglobin 13.2;  Platelets 277; Potassium 3.9; Sodium 141; TSH 0.07   Lipid Panel    Component Value Date/Time   CHOL 235 (H) 07/26/2016 0751   TRIG 104 07/26/2016 0751   HDL 46 07/26/2016 0751   CHOLHDL 5.1 (H) 07/26/2016 0751   VLDL 21 07/26/2016 0751   LDLCALC 168 (H) 07/26/2016 0751    Additional studies/ records that were reviewed today include:   Echocardiogram: 12/06/2013 Study Conclusions  Left ventricle: The cavity size was normal. Wall thickness was increased in a pattern of mild LVH. Systolic function was normal. The estimated ejection fraction was in the range of 60% to 65%. Wall motion was normal; there were no regional wall motion abnormalities. Doppler parameters are consistent with abnormal left ventricular relaxation (grade 1 diastolic dysfunction).       Transthoracic echocardiography. M-mode, complete 2D, spectral Doppler, and color Doppler. Height: Height: 170.2cm. Height: 67in. Weight: Weight: 62.3kg. Weight: 137.1lb. Body mass index: BMI: 21.5kg/m^2. Body surface area:  BSA: 1.64m^2. Blood pressure:   131/77. Patient status: Inpatient. Location: Echo laboratory.  Assessment:    1. Syncope, unspecified syncope type   2. Heart palpitations   3. Postoperative hypothyroidism   4. TOBACCO ABUSE      Plan:   In order of problems listed above:  1. Syncope - approximately 2 weeks ago she experienced a syncopal episode while walking to the restroom.  Believes she lost consciousness for only a few minutes and denies any prodromal symptoms such as lightheadedness, dizziness, palpitations, chest pain, or dyspnea. No bowel or bladder incontinence. No history of syncope or repeat episodes since. Does experience occasional palpitations.  - will obtain a 30-day cardiac event monitor to rule out a cardiac etiology of her syncopal episode as she has a history of bradycardia and experiences palpitations multiple times per week.   2. Palpitations - can occur at  random and last for seconds to minutes - plan for 30-day cardiac event monitor as above.   3. Hypothyroidism - followed by PCP. Remains on Synthroid.   4. Tobacco Use - cessation advised.   Medication Adjustments/Labs and Tests Ordered: Current  medicines are reviewed at length with the patient today.  Concerns regarding medicines are outlined above.  Medication changes, Labs and Tests ordered today are listed in the Patient Instructions below. Patient Instructions  Medication Instructions:  Your physician recommends that you continue on your current medications as directed. Please refer to the Current Medication list given to you today.  Labwork: NONE  Testing/Procedures: Your physician has recommended that you wear a 30 DAY event monitor. Event monitors are medical devices that record the heart's electrical activity. Doctors most often [place] these monitors to diagnose arrhythmias. Arrhythmias are problems with the speed or rhythm of the heartbeat. The monitor is a small, portable device. You can wear one while you do your normal daily activities. This is usually used to diagnose what is causing palpitations/syncope (passing out). -This will be placed at [place] wants you to follow-up in: 1 YEAR with Dr. Stanford Breed unless event monitor is abnormal.. You will receive a reminder letter in the mail two months in advance. If you don't receive a letter, please call our office to schedule the follow-up appointment.  Any Other Special Instructions Will Be Listed Below (If Applicable).  If you need a refill on your cardiac medications before your next appointment, please call your pharmacy.  Signed, Erma Heritage, PA-C  03/09/2017 7:44 PM    [facility] [place], [place], [place]  [address] Phone: [phone]; Fax: [phone]  [address] [place], [place] [address] Phone:  [phone]

## 2017-03-09 ENCOUNTER — Encounter: Payer: Self-pay | Admitting: Podiatry

## 2017-03-09 ENCOUNTER — Encounter: Payer: Self-pay | Admitting: Student

## 2017-03-09 ENCOUNTER — Ambulatory Visit (INDEPENDENT_AMBULATORY_CARE_PROVIDER_SITE_OTHER): Payer: 59 | Admitting: Student

## 2017-03-09 ENCOUNTER — Ambulatory Visit (INDEPENDENT_AMBULATORY_CARE_PROVIDER_SITE_OTHER): Payer: 59 | Admitting: Podiatry

## 2017-03-09 VITALS — BP 118/76 | HR 58 | Ht 67.0 in | Wt 145.2 lb

## 2017-03-09 DIAGNOSIS — R55 Syncope and collapse: Secondary | ICD-10-CM | POA: Diagnosis not present

## 2017-03-09 DIAGNOSIS — E890 Postprocedural hypothyroidism: Secondary | ICD-10-CM | POA: Diagnosis not present

## 2017-03-09 DIAGNOSIS — L600 Ingrowing nail: Secondary | ICD-10-CM | POA: Diagnosis not present

## 2017-03-09 DIAGNOSIS — F17200 Nicotine dependence, unspecified, uncomplicated: Secondary | ICD-10-CM | POA: Diagnosis not present

## 2017-03-09 DIAGNOSIS — R002 Palpitations: Secondary | ICD-10-CM | POA: Diagnosis not present

## 2017-03-09 NOTE — Patient Instructions (Signed)
Medication Instructions:  Your physician recommends that you continue on your current medications as directed. Please refer to the Current Medication list given to you today.  Labwork: NONE  Testing/Procedures: Your physician has recommended that you wear a 30 DAY event monitor. Event monitors are medical devices that record the heart's electrical activity. Doctors most often [place] these monitors to diagnose arrhythmias. Arrhythmias are problems with the speed or rhythm of the heartbeat. The monitor is a small, portable device. You can wear one while you do your normal daily activities. This is usually used to diagnose what is causing palpitations/syncope (passing out). -This will be placed at [facility] wants you to follow-up in: 1 YEAR with Dr. Stanford Breed unless event monitor is abnormal.. You will receive a reminder letter in the mail two months in advance. If you don't receive a letter, please call our office to schedule the follow-up appointment.   Any Other Special Instructions Will Be Listed Below (If Applicable).     If you need a refill on your cardiac medications before your next appointment, please call your pharmacy.

## 2017-03-09 NOTE — Progress Notes (Signed)
Subjective:    Patient ID: Emma Wilson, female   DOB: 61 y.o.   MRN: [id number]   HPI patient presents with significant ingrown toenail deformity the hallux bilateral medial border with thickness and overlying damage of the entire nail plate    ROS      Objective:  Physical Exam Neurovascular status intact muscle strength was adequate incurvation and pain of the medial border of the hallux bilateral with soreness distal redness but no active drainage    Assessment:    Ingrown toenail deformity hallux bilateral with damage to the entire nail plate     Plan:    H&P condition reviewed. Work in a focus on the ingrown toenails today and I did explain there is no guarantee the rest the nail grow normally. Patient wants procedure understanding risk and today I infiltrated each hallux 60 Milligan times like Marcaine mixture remove the medial border exposed matrix applying phenol 3 applications 30 seconds bilateral followed by alcohol lavaged sterile dressing and gave instructions on soaks. Patient will be seen back

## 2017-03-09 NOTE — Patient Instructions (Addendum)

## 2017-03-10 ENCOUNTER — Telehealth: Payer: Self-pay | Admitting: *Deleted

## 2017-03-10 NOTE — Telephone Encounter (Signed)
Pt states she misjudged yesterday and thought she would be able to go to work today, but she can't get shoes on without pain. I spoke with pt and she states she feels she'll be able to go to work on Monday. 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 Pt states understanding.

## 2017-03-16 ENCOUNTER — Ambulatory Visit (INDEPENDENT_AMBULATORY_CARE_PROVIDER_SITE_OTHER): Payer: 59

## 2017-03-16 DIAGNOSIS — R55 Syncope and collapse: Secondary | ICD-10-CM

## 2017-03-17 ENCOUNTER — Telehealth: Payer: Self-pay | Admitting: *Deleted

## 2017-03-17 NOTE — Telephone Encounter (Signed)
Pt states had procedure 03/09/2017 and statethe area is still sore, and still has some blood on the bandaid. I informed pt that this was normal for this period of time in her recovery. I told pt to continue the soaks for 4-6 weeks, at the end of the 4th week, perform the last soak of the day leave off the neosporin dressing and allow to air dry, if the area got a dry hard scab then she could stop the soaks, but if symptoms of redness, tenderness, swelling or drainage continue, continue with the soaks and antibiotic ointment for another 2 weeks. I told pt that the symptoms she was having would gradually decrease, but if at anytime they increased or had a cloudy discharge she should contact our office for an appt. Pt states understanding.

## 2017-04-25 ENCOUNTER — Telehealth: Payer: Self-pay | Admitting: Cardiology

## 2017-04-25 NOTE — Telephone Encounter (Signed)
Left message of results for pt  

## 2017-04-25 NOTE — Telephone Encounter (Signed)
New message    Pt is calling asking for a call back about her monitor results. 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

## 2017-04-28 ENCOUNTER — Telehealth: Payer: Self-pay | Admitting: Cardiology

## 2017-04-28 NOTE — Telephone Encounter (Signed)
New message     Pt would like to clarify they information Fredia Beets gave her

## 2017-04-28 NOTE — Telephone Encounter (Signed)
Returned the call to the patient. She wanted to discuss healthy eating habits and lifestyle changes. She has been educated and verbalized her understanding.

## 2017-05-24 ENCOUNTER — Ambulatory Visit
Admission: RE | Admit: 2017-05-24 | Discharge: 2017-05-24 | Disposition: A | Discharge status: Home / Self Care | Payer: 59 | Source: Ambulatory Visit | Attending: Urology | Admitting: Urology

## 2017-05-24 ENCOUNTER — Other Ambulatory Visit: Payer: Self-pay | Admitting: Urology

## 2017-05-24 DIAGNOSIS — Z85850 Personal history of malignant neoplasm of thyroid: Secondary | ICD-10-CM | POA: Insufficient documentation

## 2017-05-24 DIAGNOSIS — N200 Calculus of kidney: Secondary | ICD-10-CM

## 2017-05-24 DIAGNOSIS — Z87442 Personal history of urinary calculi: Secondary | ICD-10-CM | POA: Insufficient documentation

## 2017-05-24 DIAGNOSIS — R3129 Other microscopic hematuria: Secondary | ICD-10-CM

## 2017-06-26 DIAGNOSIS — R3121 Asymptomatic microscopic hematuria: Secondary | ICD-10-CM | POA: Insufficient documentation

## 2017-11-04 ENCOUNTER — Encounter (HOSPITAL_COMMUNITY): Payer: Self-pay | Admitting: Emergency Medicine

## 2017-11-04 ENCOUNTER — Ambulatory Visit (HOSPITAL_COMMUNITY)
Admission: EM | Admit: 2017-11-04 | Discharge: 2017-11-04 | Disposition: A | Discharge status: Home / Self Care | Payer: 59 | Attending: Internal Medicine | Admitting: Internal Medicine

## 2017-11-04 DIAGNOSIS — R29898 Other symptoms and signs involving the musculoskeletal system: Secondary | ICD-10-CM

## 2017-11-04 DIAGNOSIS — M6281 Muscle weakness (generalized): Secondary | ICD-10-CM | POA: Diagnosis not present

## 2017-11-04 NOTE — ED Triage Notes (Signed)
PT C/O: left ankle pain .... Hurts to walk and bear wt  ONSET: yest  DENIES: inj/trauma.... Has cane upon arrival  TAKING MEDS: none   A&O x4... NAD... Ambulatory

## 2017-11-04 NOTE — Discharge Instructions (Signed)
We will place a brace for your ankle to be used with activity.  Continue with prescribed medications, may apply your voltaren cream to area of pain as needed. Please follow up with your primary care provider or orthopedist in the next 1-2 weeks if your symptoms persist. If worsening of weakness, develop numbness tingling, or worsening of pain return to be seen sooner or visit Ed.

## 2017-11-04 NOTE — ED Provider Notes (Signed)
[facility]    CSN: [id number] Arrival date & time: 11/04/17  1002     History   Chief Complaint Chief Complaint  Patient presents with  . Ankle Pain    HPI Emma Wilson is a 61 y.o. female.   Nikolina presents with complaints of left ankle "giving out" which became more persistent yesterday. She states she has had intermittent episodes of this over the past three months but states that it seemed worse since yesterday. Without fall. Without injury or trauma. With a history of neuropathy, fibromyalgia, rheumatoid arthritis and osteoarthritis. She uses a cane with ambulation. Without swelling redness or specific pain. States it feels "nagging." without redness swelling or tenderness to calf. She took naproxen and tramadol last night as prescribed. She applied CBD oil to it. Rates the discomfort 5/10.   ROS per HPI.       Past Medical History:  Diagnosis Date  . Abnormality of gait 03/19/2014   one leg shorter than other"uses cane frequently"  . Carpal tunnel syndrome    right wrist  . Chronic back pain    DDD  . COPD (chronic obstructive pulmonary disease) ([place])   . Depression 2006   "after my son passed" (12/05/2013)  . Fibromyalgia   . GERD (gastroesophageal reflux disease)   . Hematuria    microscopic hematuria (urol & nephrol w/u neg), multiple UTI`s, glomerular from [facility], Dr. Jeffie Pollock 2014  . Hyperlipidemia    managed by Dr. Ouida Sills  . Hypothyroidism   . Leg length inequality    sees rheumatology  . Migraine    "q 3-4 months" (12/05/2013)  . Osteoarthritis    Rheumatoid and OA?  Dr. Titus Mould [facility], rheumatology)  . Pneumonia    "abuntantly as a child"  . Rheumatoid arthritis (HCC)    Dr. Titus Mould  . Shortness of breath    "at rest" (12/05/2013)  . Sinus bradycardia   . Thyroid cancer ([place])    S/P radiation & OR    Patient Active Problem List   Diagnosis Date Noted  . Encounter for health maintenance examination in adult 07/26/2016  .  Need for prophylactic vaccination and inoculation against influenza 07/26/2016  . History of COPD 07/26/2016  . Hereditary and idiopathic peripheral neuropathy 07/26/2016  . Decreased radial pulse 07/26/2016  . Decreased pulses in feet 07/26/2016  . Abnormality of gait 03/19/2014  . Pure hypercholesterolemia 01/24/2013  . Hematuria, unspecified 01/24/2013  . Fibromyalgia 01/24/2013  . Bradycardia 03/25/2011  . TOBACCO ABUSE 02/12/2010  . Hypothyroidism 02/11/2010  . COPD 02/11/2010  . GERD 02/11/2010    Past Surgical History:  Procedure Laterality Date  . COLONOSCOPY  10/13/2010   external and internal hemorrhoids, repeat 2021; Dr. Clarene Essex  . CYSTOSCOPY  02/26/2013   Dr. Jeffie Pollock (normal; follicular cystitis)  . EUS N/A 03/18/2016   Procedure: UPPER ENDOSCOPIC ULTRASOUND (EUS) RADIAL;  Surgeon: Milus Banister, MD;  Location: [facility];  Service: Endoscopy;  Laterality: N/A;  . THYROIDECTOMY, PARTIAL  11/2005; 11/2006  . TOTAL ABDOMINAL HYSTERECTOMY  ~ 1990   due to bleeding  . TUBAL LIGATION  1980's    OB History    No data available       Home Medications    Prior to Admission medications   Medication Sig Start Date End Date Taking? Authorizing Provider  Ascorbic Acid (VITAMIN C) 1000 MG tablet Take 1,000 mg by mouth daily.   Yes [provider]  atorvastatin (LIPITOR) 20 MG  tablet Take 1 tablet (20 mg total) by mouth daily. 07/27/16  Yes Tysinger, Camelia Eng, PA-C  calcium gluconate 500 MG tablet Take 500 mg by mouth daily.   Yes [provider]  cholecalciferol (VITAMIN D) 1000 UNITS tablet Take 2,000 Units by mouth daily.    Yes [provider]  diclofenac sodium (VOLTAREN) 1 % GEL Apply 1 application topically 3 (three) times daily as needed (For pain.).    Yes [provider]  gabapentin (NEURONTIN) 100 MG capsule Take 300 mg by mouth 2 (two) times daily.    Yes [provider]  Multiple Vitamin (MULTIVITAMIN) tablet Take 1  tablet by mouth daily.   Yes [provider]  naproxen (NAPROSYN) 500 MG tablet Take 500 mg by mouth 2 (two) times daily with a meal.   Yes [provider]  SYNTHROID 137 MCG tablet Take 1 tablet (137 mcg total) by mouth daily before breakfast. 07/27/16  Yes Tysinger, Camelia Eng, PA-C  traMADol (ULTRAM) 50 MG tablet Take 50 mg by mouth every 8 (eight) hours as needed for moderate pain. Reported on 01/14/2016   Yes [provider]  albuterol (PROAIR HFA) 108 (90 Base) MCG/ACT inhaler Inhale 2 puffs into the lungs every 6 (six) hours as needed for wheezing or shortness of breath. 01/14/16   Tysinger, Camelia Eng, PA-C  dexlansoprazole (DEXILANT) 60 MG capsule Take 1 capsule (60 mg total) by mouth daily. 07/27/16   Tysinger, Camelia Eng, PA-C    Family History Family History  Problem Relation Age of Onset  . Stroke Mother   . Coronary artery disease Mother   . Kidney disease Mother        on dialysis  . Heart disease Mother   . Cancer Father        multiple myeloma  . Arthritis Sister        rheumatoid and osteoarthritis  . Fibromyalgia Sister   . Arthritis Sister        RA and OA  . Diabetes Son     Social History Social History   Tobacco Use  . Smoking status: Current Every Day Smoker    Packs/day: 1.00    Years: 35.00    Pack years: 35.00    Types: Cigarettes  . Smokeless tobacco: Never Used  Substance Use Topics  . Alcohol use: Yes    Alcohol/week: 0.0 oz    Comment: once to twice a week   . Drug use: No     Allergies   Patient has no known allergies.   Review of Systems Review of Systems   Physical Exam Triage Vital Signs ED Triage Vitals  Enc Vitals Group     BP 11/04/17 1013 130/88     Pulse Rate 11/04/17 1013 (!) 56     Resp 11/04/17 1013 20     Temp 11/04/17 1013 97.6 F (36.4 C)     Temp Source 11/04/17 1013 Oral     SpO2 11/04/17 1013 98 %     Weight --      Height --      Head Circumference --      Peak Flow --      Pain Score  11/04/17 1010 5     Pain Loc --      Pain Edu? --      Excl. in [place]? --    No data found.  Updated Vital Signs BP 130/88 (BP Location: Left Arm)   Pulse (!) 56  Temp 97.6 F (36.4 C) (Oral)   Resp 20   SpO2 98%   Visual Acuity Right Eye Distance:   Left Eye Distance:   Bilateral Distance:    Right Eye Near:   Left Eye Near:    Bilateral Near:     Physical Exam  Constitutional: She is oriented to person, place, and time. She appears well-developed and well-nourished. No distress.  Cardiovascular: Normal rate, regular rhythm and normal heart sounds.  Pulmonary/Chest: Effort normal and breath sounds normal.  Musculoskeletal:       Left ankle: She exhibits normal range of motion, no swelling, no ecchymosis, no deformity, no laceration and normal pulse. No tenderness.       Feet:  Mild generalized lateral ankle tenderness; gross sensation intact; strength to bilateral ankles equal with complete ROM; full rom to toes and knees. Ambulatory; equal palpable pedal pulses  Neurological: She is alert and oriented to person, place, and time.  Skin: Skin is warm and dry.     UC Treatments / Results  Labs (all labs ordered are listed, but only abnormal results are displayed) Labs Reviewed - No data to display  EKG  EKG Interpretation None       Radiology No results found.  Procedures Procedures (including critical care time)  Medications Ordered in UC Medications - No data to display   Initial Impression / Assessment and Plan / UC Course  I have reviewed the triage vital signs and the nursing notes.  Pertinent labs & imaging results that were available during my care of the patient were reviewed by me and considered in my medical decision making (see chart for details).     Without acute findings on exam at this time. Patient is ambulatory, without trauma, ankle ROM and strength normal on exam. Recommended brace for further support at this time with close follow up  with PCP and/or orthopedics if this persists or worsens concerning for possibly neurological concern? Patient states she has a physical with her PCP scheduled already in the next 2 weeks. Return precautions provided. Patient verbalized understanding and agreeable to plan.  Ambulatory out of clinic without difficulty with her cane.   Final Clinical Impressions(s) / UC Diagnoses   Final diagnoses:  Ankle weakness    ED Discharge Orders    None       Controlled Substance Prescriptions  Controlled Substance Registry consulted? Not Applicable   Zigmund Gottron, NP 11/04/17 1031

## 2018-04-23 ENCOUNTER — Encounter (HOSPITAL_COMMUNITY): Payer: Self-pay | Admitting: Emergency Medicine

## 2018-04-23 ENCOUNTER — Ambulatory Visit (HOSPITAL_COMMUNITY)
Admission: EM | Admit: 2018-04-23 | Discharge: 2018-04-23 | Disposition: A | Discharge status: Home / Self Care | Payer: 59 | Attending: Family Medicine | Admitting: Family Medicine

## 2018-04-23 DIAGNOSIS — M25552 Pain in left hip: Secondary | ICD-10-CM | POA: Diagnosis not present

## 2018-04-23 MED ORDER — LIDOCAINE 5 % EX PTCH: 1.0000 | MEDICATED_PATCH | CUTANEOUS | 0 refills | Status: DC

## 2018-04-23 MED ORDER — TRAMADOL HCL 50 MG PO TABS: 50.0000 mg | ORAL_TABLET | Freq: Two times a day (BID) | ORAL | 0 refills | Status: DC | PRN

## 2018-04-23 NOTE — Discharge Instructions (Signed)
Continue current medicines. You can add on lidoderm as directed. Tramadol as needed for any additional pain relief. Follow up with PCP for further evaluation and management needed.

## 2018-04-23 NOTE — ED Triage Notes (Signed)
Pt c/o L hip pain, pt has fibromyalgia, states the weather has been making her hip hurt worse.

## 2018-04-23 NOTE — ED Provider Notes (Signed)
[facility]    CSN: [id number] Arrival date & time: 04/23/18  1340     History   Chief Complaint Chief Complaint  Patient presents with  . Hip Pain    HPI Emma Wilson is a 62 y.o. female.   62 year old female comes in for acute on chronic left hip pain.  Denies injury/trauma.  States due to the weather, left hip has been more painful.  She is on gabapentin, meloxicam daily for the pain, and states usually it is controlled well.  She has tried Voltaren gel, other topical treatments without relief since pain increased.  Denies radiation of pain.  Denies numbness, tingling.  Denies saddle anesthesia, loss of bladder or bowel control.     Past Medical History:  Diagnosis Date  . Abnormality of gait 03/19/2014   one leg shorter than other"uses cane frequently"  . Carpal tunnel syndrome    right wrist  . Chronic back pain    DDD  . COPD (chronic obstructive pulmonary disease) ([place])   . Depression 2006   "after my son passed" (12/05/2013)  . Fibromyalgia   . GERD (gastroesophageal reflux disease)   . Hematuria    microscopic hematuria (urol & nephrol w/u neg), multiple UTI`s, glomerular from [facility], Dr. Jeffie Pollock 2014  . Hyperlipidemia    managed by Dr. Ouida Sills  . Hypothyroidism   . Leg length inequality    sees rheumatology  . Migraine    "q 3-4 months" (12/05/2013)  . Osteoarthritis    Rheumatoid and OA?  Dr. Titus Mould [facility], rheumatology)  . Pneumonia    "abuntantly as a child"  . Rheumatoid arthritis (HCC)    Dr. Titus Mould  . Shortness of breath    "at rest" (12/05/2013)  . Sinus bradycardia   . Thyroid cancer ([place])    S/P radiation & OR    Patient Active Problem List   Diagnosis Date Noted  . Encounter for health maintenance examination in adult 07/26/2016  . Need for prophylactic vaccination and inoculation against influenza 07/26/2016  . History of COPD 07/26/2016  . Hereditary and idiopathic peripheral neuropathy 07/26/2016  . Decreased  radial pulse 07/26/2016  . Decreased pulses in feet 07/26/2016  . Abnormality of gait 03/19/2014  . Pure hypercholesterolemia 01/24/2013  . Hematuria, unspecified 01/24/2013  . Fibromyalgia 01/24/2013  . Bradycardia 03/25/2011  . TOBACCO ABUSE 02/12/2010  . Hypothyroidism 02/11/2010  . COPD 02/11/2010  . GERD 02/11/2010    Past Surgical History:  Procedure Laterality Date  . COLONOSCOPY  10/13/2010   external and internal hemorrhoids, repeat 2021; Dr. Clarene Essex  . CYSTOSCOPY  02/26/2013   Dr. Jeffie Pollock (normal; follicular cystitis)  . EUS N/A 03/18/2016   Procedure: UPPER ENDOSCOPIC ULTRASOUND (EUS) RADIAL;  Surgeon: Milus Banister, MD;  Location: [facility];  Service: Endoscopy;  Laterality: N/A;  . THYROIDECTOMY, PARTIAL  11/2005; 11/2006  . TOTAL ABDOMINAL HYSTERECTOMY  ~ 1990   due to bleeding  . TUBAL LIGATION  1980's    OB History   None      Home Medications    Prior to Admission medications   Medication Sig Start Date End Date Taking? Authorizing Provider  MELOXICAM PO Take by mouth.   Yes [provider]  albuterol (PROAIR HFA) 108 (90 Base) MCG/ACT inhaler Inhale 2 puffs into the lungs every 6 (six) hours as needed for wheezing or shortness of breath. 01/14/16   Tysinger, Camelia Eng, PA-C  Ascorbic Acid (VITAMIN C) 1000 MG tablet  Take 1,000 mg by mouth daily.    [provider]  atorvastatin (LIPITOR) 20 MG tablet Take 1 tablet (20 mg total) by mouth daily. 07/27/16   Tysinger, Camelia Eng, PA-C  calcium gluconate 500 MG tablet Take 500 mg by mouth daily.    [provider]  cholecalciferol (VITAMIN D) 1000 UNITS tablet Take 2,000 Units by mouth daily.     [provider]  dexlansoprazole (DEXILANT) 60 MG capsule Take 1 capsule (60 mg total) by mouth daily. 07/27/16   Tysinger, Camelia Eng, PA-C  diclofenac sodium (VOLTAREN) 1 % GEL Apply 1 application topically 3 (three) times daily as needed (For pain.).     [provider]  gabapentin  (NEURONTIN) 100 MG capsule Take 300 mg by mouth 2 (two) times daily.     [provider]  lidocaine (LIDODERM) 5 % Place 1 patch onto the skin daily. Remove & Discard patch within 12 hours or as directed by MD 04/23/18   Ok Edwards, PA-C  Multiple Vitamin (MULTIVITAMIN) tablet Take 1 tablet by mouth daily.    [provider]  SYNTHROID 137 MCG tablet Take 1 tablet (137 mcg total) by mouth daily before breakfast. 07/27/16   Tysinger, Camelia Eng, PA-C  traMADol (ULTRAM) 50 MG tablet Take 1 tablet (50 mg total) by mouth every 12 (twelve) hours as needed for severe pain. 04/23/18   Ok Edwards, PA-C    Family History Family History  Problem Relation Age of Onset  . Stroke Mother   . Coronary artery disease Mother   . Kidney disease Mother        on dialysis  . Heart disease Mother   . Cancer Father        multiple myeloma  . Arthritis Sister        rheumatoid and osteoarthritis  . Fibromyalgia Sister   . Arthritis Sister        RA and OA  . Diabetes Son     Social History Social History   Tobacco Use  . Smoking status: Current Every Day Smoker    Packs/day: 1.00    Years: 35.00    Pack years: 35.00    Types: Cigarettes  . Smokeless tobacco: Never Used  Substance Use Topics  . Alcohol use: Yes    Alcohol/week: 0.0 oz    Comment: once to twice a week   . Drug use: No     Allergies   Patient has no known allergies.   Review of Systems Review of Systems  Reason unable to perform ROS: See HPI as above.     Physical Exam Triage Vital Signs ED Triage Vitals [04/23/18 1434]  Enc Vitals Group     BP (!) 162/92     Pulse Rate (!) 54     Resp 16     Temp 97.9 F (36.6 C)     Temp src      SpO2 99 %     Weight      Height      Head Circumference      Peak Flow      Pain Score      Pain Loc      Pain Edu?      Excl. in [place]?    No data found.  Updated Vital Signs BP (!) 162/92   Pulse (!) 54   Temp 97.9 F (36.6 C)   Resp 16   SpO2 99%   Physical  Exam  Constitutional: She is oriented to person, place, and time. She appears well-developed and well-nourished. No distress.  HENT:  Head: Normocephalic and atraumatic.  Eyes: Pupils are equal, round, and reactive to light. Conjunctivae are normal.  Cardiovascular: Normal rate, regular rhythm and normal heart sounds. Exam reveals no gallop and no friction rub.  No murmur heard. Pulmonary/Chest: Effort normal and breath sounds normal. No stridor. No respiratory distress. She has no wheezes. She has no rales.  Musculoskeletal:  No tenderness on palpation of the spinous processes.  Tenderness to palpation of left lumbar region, posterior and lateral hip. Decreased flexion of active hip ROM, which is at baseline for patient. Full passive range of motion of hip.  Strength deferred.  Sensation intact and equal bilaterally.  Negative straight leg raise.  Neurological: She is alert and oriented to person, place, and time.  Skin: Skin is warm and dry.     UC Treatments / Results  Labs (all labs ordered are listed, but only abnormal results are displayed) Labs Reviewed - No data to display  EKG None  Radiology No results found.  Procedures Procedures (including critical care time)  Medications Ordered in UC Medications - No data to display  Initial Impression / Assessment and Plan / UC Course  I have reviewed the triage vital signs and the nursing notes.  Pertinent labs & imaging results that were available during my care of the patient were reviewed by me and considered in my medical decision making (see chart for details).    Patient states with improvement in the past with Lidoderm, and willing to restart.  Will restart Lidoderm patches.  5 pills of tramadol provided for additional pain relief.  Patient will follow-up with PCP this upcoming week for further evaluation if symptoms not improving.  Final Clinical Impressions(s) / UC Diagnoses   Final diagnoses:  Left hip pain     ED Prescriptions    Medication Sig Dispense Auth. Provider   lidocaine (LIDODERM) 5 % Place 1 patch onto the skin daily. Remove & Discard patch within 12 hours or as directed by MD 15 patch Yu, Amy V, PA-C   traMADol (ULTRAM) 50 MG tablet Take 1 tablet (50 mg total) by mouth every 12 (twelve) hours as needed for severe pain. 5 tablet Ok Edwards, PA-C     Controlled Substance Prescriptions [place] Controlled Substance Registry consulted? Yes, I have consulted the [place] Controlled Substances Registry for this patient, and feel the risk/benefit ratio today is favorable for proceeding with this prescription for a controlled substance.   Ok Edwards, PA-C 04/23/18 [phone]

## 2018-05-12 ENCOUNTER — Encounter: Payer: Self-pay | Admitting: Podiatry

## 2018-05-12 ENCOUNTER — Ambulatory Visit (INDEPENDENT_AMBULATORY_CARE_PROVIDER_SITE_OTHER): Payer: 59 | Admitting: Podiatry

## 2018-05-12 DIAGNOSIS — M779 Enthesopathy, unspecified: Secondary | ICD-10-CM

## 2018-05-12 DIAGNOSIS — B070 Plantar wart: Secondary | ICD-10-CM

## 2018-05-12 MED ORDER — TRIAMCINOLONE ACETONIDE 10 MG/ML IJ SUSP: 10.0000 mg | Freq: Once | INTRAMUSCULAR | Status: DC

## 2018-05-12 NOTE — Progress Notes (Signed)
Subjective:   Patient ID: Emma Wilson, female   DOB: 62 y.o.   MRN: [id number]   HPI Patient presents with a painful lesion plantar aspect left foot and feels like it may be a wart also has fluid buildup around the joint of her left foot on the outside   ROS      Objective:  Physical Exam  Neurovascular status intact with inflammation base of fifth metatarsal left with fluid buildup and also noted to have a lesion plantar aspect left that has a core that has pinpoint bleeding and pain to lateral pressure     Assessment:  Inflammatory capsulitis base of fifth metatarsal left with lesion formation that has a core present and pinpoint bleeding upon debridement and measures approximately 6 x 6 mm     Plan:  Reviewed condition and sterile debridement of lesion accomplished I flushed the area out and applied chemical agent to create immune response with sterile dressing.  I then injected the base of the fifth metatarsal left 3 mg Kenalog 5 mg Xylocaine applied sterile dressing to the area

## 2018-07-11 ENCOUNTER — Telehealth: Payer: Self-pay | Admitting: Cardiology

## 2018-07-11 NOTE — Telephone Encounter (Signed)
New Message    Pt c/o of Chest Pain: STAT if CP now or developed within 24 hours  1. Are you having CP right now? no  2. Are you experiencing any other symptoms (ex. SOB, nausea, vomiting, sweating)? No symptoms   3. How long have you been experiencing CP? Just a few hours   4. Is your CP continuous or coming and going? Coming and going   5. Have you taken Nitroglycerin? No    Patient states that the pain occurred for a few hours yesterday and has since subsided. But she is not sure if it was a pulled muscle or not. But she is concerned. Patient has an appointment for 09/12 with Dr. Stanford Breed.

## 2018-07-14 NOTE — Telephone Encounter (Signed)
SPOKE TO PATIENT . SHE IS AWARE OF APPOINTMENT. NO ISSUE AT PRESENT TIME. AWARE IF SYMPTOMS BECOME WORSE -GO TO ER. PATIENT VERBALIZED UNDERSTANDING.

## 2018-07-25 NOTE — Progress Notes (Signed)
HPI: FU CP. Myoview August of 2011 revealed EF of 53 and no ischemia. Echocardiogram January 2015 showed normal LV function and grade 1 diastolic dysfunction. Exercise treadmill March 2015 normal. ABIs 9/17 normal.  Seen for a syncopal episode April 2018.  Event monitor 5/18 showed sinus with PVC. Since last seen, approximately 2 weeks ago patient had an episode of chest pain.  It was described as a tightness in the upper chest area.  No radiation.  Not pleuritic, positional.  Resolved spontaneously after 2 hours.  No associated symptoms.  She typically does not have exertional chest pain, dyspnea on exertion, orthopnea, PND or pedal edema.  Current Outpatient Medications  Medication Sig Dispense Refill  . albuterol (PROAIR HFA) 108 (90 Base) MCG/ACT inhaler Inhale 2 puffs into the lungs every 6 (six) hours as needed for wheezing or shortness of breath. 18 g 0  . Ascorbic Acid (VITAMIN C) 1000 MG tablet Take 1,000 mg by mouth daily.    Marland Kitchen atorvastatin (LIPITOR) 20 MG tablet Take 1 tablet (20 mg total) by mouth daily. 90 tablet 3  . calcium gluconate 500 MG tablet Take 500 mg by mouth daily.    . cholecalciferol (VITAMIN D) 1000 UNITS tablet Take 2,000 Units by mouth daily.     Marland Kitchen dexlansoprazole (DEXILANT) 60 MG capsule Take 1 capsule (60 mg total) by mouth daily. 90 capsule 3  . diclofenac sodium (VOLTAREN) 1 % GEL Apply 1 application topically 3 (three) times daily as needed (For pain.).     Marland Kitchen gabapentin (NEURONTIN) 100 MG capsule Take 300 mg by mouth 2 (two) times daily.     . MELOXICAM PO Take by mouth.    . Multiple Vitamin (MULTIVITAMIN) tablet Take 1 tablet by mouth daily.    Marland Kitchen SYNTHROID 137 MCG tablet Take 1 tablet (137 mcg total) by mouth daily before breakfast. 90 tablet 1   Current Facility-Administered Medications  Medication Dose Route Frequency Provider Last Rate Last Dose  . triamcinolone acetonide (KENALOG) 10 MG/ML injection 10 mg  10 mg Other Once Wallene Huh, DPM           Past Medical History:  Diagnosis Date  . Abnormality of gait 03/19/2014   one leg shorter than other"uses cane frequently"  . Carpal tunnel syndrome    right wrist  . Chronic back pain    DDD  . COPD (chronic obstructive pulmonary disease) ([place])   . Depression 2006   "after my son passed" (12/05/2013)  . Fibromyalgia   . GERD (gastroesophageal reflux disease)   . Hematuria    microscopic hematuria (urol & nephrol w/u neg), multiple UTI`s, glomerular from [facility], Dr. Jeffie Pollock 2014  . Hyperlipidemia    managed by Dr. Ouida Sills  . Hypothyroidism   . Leg length inequality    sees rheumatology  . Migraine    "q 3-4 months" (12/05/2013)  . Osteoarthritis    Rheumatoid and OA?  Dr. Titus Mould [facility], rheumatology)  . Pneumonia    "abuntantly as a child"  . Rheumatoid arthritis (HCC)    Dr. Titus Mould  . Shortness of breath    "at rest" (12/05/2013)  . Sinus bradycardia   . Thyroid cancer ([place])    S/P radiation & OR    Past Surgical History:  Procedure Laterality Date  . COLONOSCOPY  10/13/2010   external and internal hemorrhoids, repeat 2021; Dr. Clarene Essex  . CYSTOSCOPY  02/26/2013   Dr. Jeffie Pollock (normal; follicular cystitis)  .  EUS N/A 03/18/2016   Procedure: UPPER ENDOSCOPIC ULTRASOUND (EUS) RADIAL;  Surgeon: Milus Banister, MD;  Location: [facility];  Service: Endoscopy;  Laterality: N/A;  . THYROIDECTOMY, PARTIAL  11/2005; 11/2006  . TOTAL ABDOMINAL HYSTERECTOMY  ~ 1990   due to bleeding  . TUBAL LIGATION  1980's    Social History   Socioeconomic History  . Marital status: Divorced    Spouse name: Not on file  . Number of children: 3  . Years of education: college  . Highest education level: Not on file  Occupational History  . Occupation: Armed forces operational officer: [employer]  Social Needs  . Financial resource strain: Not on file  . Food insecurity:    Worry: Not on file    Inability: Not on file  . Transportation needs:    Medical: Not on file    Non-medical:  Not on file  Tobacco Use  . Smoking status: Current Every Day Smoker    Packs/day: 1.00    Years: 35.00    Pack years: 35.00    Types: Cigarettes  . Smokeless tobacco: Never Used  Substance and Sexual Activity  . Alcohol use: Yes    Alcohol/week: 0.0 standard drinks    Comment: once to twice a week   . Drug use: No  . Sexual activity: Not Currently    Partners: Male  Lifestyle  . Physical activity:    Days per week: Not on file    Minutes per session: Not on file  . Stress: Not on file  Relationships  . Social connections:    Talks on phone: Not on file    Gets together: Not on file    Attends religious service: Not on file    Active member of club or organization: Not on file    Attends meetings of clubs or organizations: Not on file    Relationship status: Not on file  . Intimate partner violence:    Fear of current or ex partner: Not on file    Emotionally abused: Not on file    Physically abused: Not on file    Forced sexual activity: Not on file  Other Topics Concern  . Not on file  Social History Narrative   Lives alone.  2 living children (1 deceased), 1 in [place], 1 in [place], 6 grandchildren.  Works at [employer].   Exercise some.  As of 07/2016    Family History  Problem Relation Age of Onset  . Stroke Mother   . Coronary artery disease Mother   . Kidney disease Mother        on dialysis  . Heart disease Mother   . Cancer Father        multiple myeloma  . Arthritis Sister        rheumatoid and osteoarthritis  . Fibromyalgia Sister   . Arthritis Sister        RA and OA  . Diabetes Son     ROS: Fibromyalgia pain but no fevers or chills, productive cough, hemoptysis, dysphasia, odynophagia, melena, hematochezia, dysuria, hematuria, rash, seizure activity, orthopnea, PND, pedal edema, claudication. Remaining systems are negative.  Physical Exam: Well-developed well-nourished in no acute distress.  Skin is warm and dry.  HEENT is normal.  Neck  is supple.  Chest is clear to auscultation with normal expansion.  Cardiovascular exam is regular rate and rhythm.  Abdominal exam nontender or distended. No masses palpated. Extremities show no edema. neuro grossly  intact  ECG-sinus bradycardia at a rate of 52.  Nonspecific ST changes.  Personally reviewed  A/P  1 chest pain-patient continues to have atypical chronic chest pain.  Previous functional study negative.  Electrocardiogram shows nonspecific ST changes.  We discussed repeating functional study but she would prefer observation at this point.  If she has more symptoms we will plan repeat study in the future.  2 hyperlipidemia-continue statin.  Lipids and liver monitored by primary care.  3 tobacco abuse-patient counseled on discontinuing.  4 palpitations-previous monitor showed sinus with PVC.  No symptoms at present.  We will consider a beta-blocker in the future if needed.  5 syncope-no recurrent episodes.  Kirk Ruths, MD

## 2018-07-27 ENCOUNTER — Ambulatory Visit (INDEPENDENT_AMBULATORY_CARE_PROVIDER_SITE_OTHER): Payer: 59 | Admitting: Cardiology

## 2018-07-27 ENCOUNTER — Encounter: Payer: Self-pay | Admitting: Cardiology

## 2018-07-27 VITALS — BP 114/84 | HR 52 | Ht 67.0 in | Wt 142.0 lb

## 2018-07-27 DIAGNOSIS — E7800 Pure hypercholesterolemia, unspecified: Secondary | ICD-10-CM

## 2018-07-27 DIAGNOSIS — R079 Chest pain, unspecified: Secondary | ICD-10-CM

## 2018-07-27 DIAGNOSIS — F17200 Nicotine dependence, unspecified, uncomplicated: Secondary | ICD-10-CM

## 2018-07-27 DIAGNOSIS — R002 Palpitations: Secondary | ICD-10-CM

## 2018-07-27 NOTE — Patient Instructions (Signed)
Medication Instructions: Your physician recommends that you continue on your current medications as directed.    If you need a refill on your cardiac medications before your next appointment, please call your pharmacy.   Labwork: None  Procedures/Testing: None  Follow-Up: Your physician wants you to follow-up in 1 year with Dr. Stanford Breed. You will receive a reminder letter in the mail two months in advance. If you don't receive a letter, please call our office at [phone] to schedule this follow-up appointment.   Special Instructions:    Thank you for choosing [facility] at [facility]!!

## 2018-09-14 ENCOUNTER — Encounter: Payer: Self-pay | Admitting: Podiatry

## 2018-09-14 ENCOUNTER — Ambulatory Visit (INDEPENDENT_AMBULATORY_CARE_PROVIDER_SITE_OTHER): Payer: 59 | Admitting: Podiatry

## 2018-09-14 ENCOUNTER — Ambulatory Visit (INDEPENDENT_AMBULATORY_CARE_PROVIDER_SITE_OTHER): Payer: 59

## 2018-09-14 ENCOUNTER — Other Ambulatory Visit: Payer: Self-pay | Admitting: Podiatry

## 2018-09-14 DIAGNOSIS — M779 Enthesopathy, unspecified: Secondary | ICD-10-CM | POA: Diagnosis not present

## 2018-09-14 DIAGNOSIS — M778 Other enthesopathies, not elsewhere classified: Secondary | ICD-10-CM

## 2018-09-14 DIAGNOSIS — B070 Plantar wart: Secondary | ICD-10-CM | POA: Diagnosis not present

## 2018-09-14 MED ORDER — TRIAMCINOLONE ACETONIDE 10 MG/ML IJ SUSP
10.0000 mg | Freq: Once | INTRAMUSCULAR | Status: AC
  Administered 2018-09-14: 10 mg

## 2018-09-14 NOTE — Progress Notes (Signed)
Subjective:   Patient ID: [name], female   DOB: 62 y.o.   MRN: [id number]   HPI Patient states she gets pins-and-needles in both feet despite the fact she does take gabapentin 600 mg twice daily and has a lot of pain bottom of the right foot fifth metatarsal base with fluid buildup   ROS      Objective:  Physical Exam  Inflammatory capsulitis fifth metatarsal base with keratotic lesion formation with pinpoint bleeding noted upon debridement of the lesion with patient also noted to have foot pain generalized bilateral that intensified     Assessment:  Inflammatory capsulitis fifth MPJ right with chronic foot pain bilateral is worsened over the last few months and possibility for verruca plantaris plantar right     Plan:  H&P x-rays reviewed and today him to focus on the area the bottom of the right foot.  I injected with 3 mg Kenalog 5 mg Xylocaine I then also took the lesion and debrided it with sterile instrumentation applied salicylic acid with sterile dressing and discussed both feet and possibly going to 3 gabapentin a day to 600 mg  X-rays taken bilateral did indicate elevation of the first metatarsal segment bilateral with mild functional hallux limitus but no further pathology noted

## 2019-01-17 ENCOUNTER — Encounter (HOSPITAL_COMMUNITY): Payer: Self-pay | Admitting: Emergency Medicine

## 2019-01-17 ENCOUNTER — Ambulatory Visit (HOSPITAL_COMMUNITY)
Admission: EM | Admit: 2019-01-17 | Discharge: 2019-01-17 | Disposition: A | Discharge status: Home / Self Care | Payer: 59 | Attending: Family Medicine | Admitting: Family Medicine

## 2019-01-17 DIAGNOSIS — M25552 Pain in left hip: Secondary | ICD-10-CM | POA: Diagnosis not present

## 2019-01-17 MED ORDER — TRAMADOL HCL 50 MG PO TABS: 50.0000 mg | ORAL_TABLET | Freq: Four times a day (QID) | ORAL | 0 refills | Status: DC | PRN

## 2019-01-17 NOTE — ED Triage Notes (Signed)
Pt c/o L hip pain x2 days, tried to call her rheumatology but no open today, states hx of sciatica. Denies injury, painful with movement.

## 2019-01-17 NOTE — Discharge Instructions (Signed)
Be aware, pain medications may cause drowsiness. Please do not drive, operate heavy machinery or make important decisions while on this medication, it can cloud your judgement.  

## 2019-01-17 NOTE — ED Provider Notes (Signed)
[place]   [id number] 01/17/19 Arrival Time: 0037  ASSESSMENT & PLAN:  1. Left hip pain   Acute on chronic.  Meds ordered this encounter  Medications  . traMADol (ULTRAM) 50 MG tablet    Sig: Take 1 tablet (50 mg total) by mouth every 6 (six) hours as needed.    Dispense:  12 tablet    Refill:  0   She plans to schedule prompt f/u with her rheumatologist. May f/u here as needed.  [place] Controlled Substances Registry consulted for this patient. I feel the risk/benefit ratio today is favorable for proceeding with this prescription for a controlled substance. Medication sedation precautions given.  Reviewed expectations re: course of current medical issues. Questions answered. Outlined signs and symptoms indicating need for more acute intervention. Patient verbalized understanding. After Visit Summary given.  SUBJECTIVE: History from: patient. Emma Wilson is a 63 y.o. female who reports intermittent moderate pain of her left hip; described as aching without radiation. Onset: gradual, over the past few days. This is a long-standing problem with occasional flare-ups. Followed by rheumatology. Injury/trama: no. Symptoms have progressed to a point and plateaued since beginning. Aggravating factors: movement. Alleviating factors: rest. Associated symptoms: none reported. Extremity sensation changes or weakness: none. Self treatment: tried OTCs without relief of pain. Normal bowel/bladder habits. History of similar: yes.  Past Surgical History:  Procedure Laterality Date  . COLONOSCOPY  10/13/2010   external and internal hemorrhoids, repeat 2021; Dr. Clarene Essex  . CYSTOSCOPY  02/26/2013   Dr. Jeffie Pollock (normal; follicular cystitis)  . EUS N/A 03/18/2016   Procedure: UPPER ENDOSCOPIC ULTRASOUND (EUS) RADIAL;  Surgeon: Milus Banister, MD;  [facility]: [facility];  Service: Endoscopy;  Laterality: N/A;  . THYROIDECTOMY, PARTIAL  11/2005; 11/2006  . TOTAL ABDOMINAL  HYSTERECTOMY  ~ 1990   due to bleeding  . TUBAL LIGATION  1980's     ROS: As per HPI. All other systems negative.    OBJECTIVE:  Vitals:   01/17/19 1319  BP: (!) 155/86  Pulse: (!) 54  Resp: 18  Temp: 97.9 F (36.6 C)  SpO2: 100%    General appearance: alert; no distress Back: no tenderness over lower paraspinal musculature Extremities: . LLE: warm and well perfused; poorly localized moderate tenderness over left lateral hip; without gross deformities; with no swelling; with no bruising; ROM: normal with reported discomfort CV: brisk extremity capillary refill of LLE; 2+ DP and PT pulse of LLE. Skin: warm and dry; no visible rashes Neurologic: gait normal; normal reflexes of RLE and LLE; normal sensation of RLE and LLE; normal strength of RLE and LLE Psychological: alert and cooperative; normal mood and affect  No Known Allergies  Past Medical History:  Diagnosis Date  . Abnormality of gait 03/19/2014   one leg shorter than other"uses cane frequently"  . Carpal tunnel syndrome    right wrist  . Chronic back pain    DDD  . COPD (chronic obstructive pulmonary disease) ([place])   . Depression 2006   "after my son passed" (12/05/2013)  . Fibromyalgia   . GERD (gastroesophageal reflux disease)   . Hematuria    microscopic hematuria (urol & nephrol w/u neg), multiple UTI`s, glomerular from [facility], Dr. Jeffie Pollock 2014  . Hyperlipidemia    managed by Dr. Ouida Sills  . Hypothyroidism   . Leg length inequality    sees rheumatology  . Migraine    "q 3-4 months" (12/05/2013)  . Osteoarthritis    Rheumatoid and OA?  Dr. Titus Mould [facility], rheumatology)  . Pneumonia    "abuntantly as a child"  . Rheumatoid arthritis (HCC)    Dr. Titus Mould  . Shortness of breath    "at rest" (12/05/2013)  . Sinus bradycardia   . Thyroid cancer ([place])    S/P radiation & OR   Social History   Socioeconomic History  . Marital status: Divorced    Spouse name: Not on file  . Number of children: 3    . Years of education: college  . Highest education level: Not on file  Occupational History  . Occupation: Armed forces operational officer: [employer]  Social Needs  . Financial resource strain: Not on file  . Food insecurity:    Worry: Not on file    Inability: Not on file  . Transportation needs:    Medical: Not on file    Non-medical: Not on file  Tobacco Use  . Smoking status: Current Every Day Smoker    Packs/day: 1.00    Years: 35.00    Pack years: 35.00    Types: Cigarettes  . Smokeless tobacco: Never Used  Substance and Sexual Activity  . Alcohol use: Yes    Alcohol/week: 0.0 standard drinks    Comment: once to twice a week   . Drug use: No  . Sexual activity: Not Currently    Partners: Male  Lifestyle  . Physical activity:    Days per week: Not on file    Minutes per session: Not on file  . Stress: Not on file  Relationships  . Social connections:    Talks on phone: Not on file    Gets together: Not on file    Attends religious service: Not on file    Active member of club or organization: Not on file    Attends meetings of clubs or organizations: Not on file    Relationship status: Not on file  Other Topics Concern  . Not on file  Social History Narrative   Lives alone.  2 living children (1 deceased), 1 in [place], 1 in [place], 6 grandchildren.  Works at [employer].   Exercise some.  As of 07/2016   Family History  Problem Relation Age of Onset  . Stroke Mother   . Coronary artery disease Mother   . Kidney disease Mother        on dialysis  . Heart disease Mother   . Cancer Father        multiple myeloma  . Arthritis Sister        rheumatoid and osteoarthritis  . Fibromyalgia Sister   . Arthritis Sister        RA and OA  . Diabetes Son    Past Surgical History:  Procedure Laterality Date  . COLONOSCOPY  10/13/2010   external and internal hemorrhoids, repeat 2021; Dr. Clarene Essex  . CYSTOSCOPY  02/26/2013   Dr. Jeffie Pollock (normal; follicular  cystitis)  . EUS N/A 03/18/2016   Procedure: UPPER ENDOSCOPIC ULTRASOUND (EUS) RADIAL;  Surgeon: Milus Banister, MD;  Location: [facility];  Service: Endoscopy;  Laterality: N/A;  . THYROIDECTOMY, PARTIAL  11/2005; 11/2006  . TOTAL ABDOMINAL HYSTERECTOMY  ~ 1990   due to bleeding  . TUBAL LIGATION  1980's      Vanessa Kick, MD 01/29/19 [phone]

## 2019-01-19 ENCOUNTER — Other Ambulatory Visit: Payer: Self-pay

## 2019-01-19 ENCOUNTER — Emergency Department (HOSPITAL_COMMUNITY)
Admission: EM | Admit: 2019-01-19 | Discharge: 2019-01-19 | Disposition: A | Discharge status: Home / Self Care | Payer: 59 | Attending: Emergency Medicine | Admitting: Emergency Medicine

## 2019-01-19 ENCOUNTER — Encounter (HOSPITAL_COMMUNITY): Payer: Self-pay | Admitting: Emergency Medicine

## 2019-01-19 ENCOUNTER — Ambulatory Visit (INDEPENDENT_AMBULATORY_CARE_PROVIDER_SITE_OTHER): Payer: 59 | Admitting: Podiatry

## 2019-01-19 ENCOUNTER — Encounter: Payer: Self-pay | Admitting: Podiatry

## 2019-01-19 DIAGNOSIS — F17210 Nicotine dependence, cigarettes, uncomplicated: Secondary | ICD-10-CM | POA: Diagnosis not present

## 2019-01-19 DIAGNOSIS — M779 Enthesopathy, unspecified: Secondary | ICD-10-CM

## 2019-01-19 DIAGNOSIS — B070 Plantar wart: Secondary | ICD-10-CM

## 2019-01-19 DIAGNOSIS — Z79899 Other long term (current) drug therapy: Secondary | ICD-10-CM | POA: Insufficient documentation

## 2019-01-19 DIAGNOSIS — E039 Hypothyroidism, unspecified: Secondary | ICD-10-CM | POA: Insufficient documentation

## 2019-01-19 DIAGNOSIS — M778 Other enthesopathies, not elsewhere classified: Secondary | ICD-10-CM

## 2019-01-19 DIAGNOSIS — G8929 Other chronic pain: Secondary | ICD-10-CM

## 2019-01-19 DIAGNOSIS — M5442 Lumbago with sciatica, left side: Secondary | ICD-10-CM | POA: Insufficient documentation

## 2019-01-19 DIAGNOSIS — M545 Low back pain: Secondary | ICD-10-CM | POA: Diagnosis present

## 2019-01-19 MED ORDER — LIDOCAINE 5 % EX PTCH
1.0000 | MEDICATED_PATCH | CUTANEOUS | Status: DC
  Administered 2019-01-19: 1 via TRANSDERMAL
  Filled 2019-01-19: qty 1

## 2019-01-19 MED ORDER — LIDOCAINE 5 % EX PTCH: 1.0000 | MEDICATED_PATCH | CUTANEOUS | 0 refills | Status: DC

## 2019-01-19 MED ORDER — TRIAMCINOLONE ACETONIDE 10 MG/ML IJ SUSP
10.0000 mg | Freq: Once | INTRAMUSCULAR | Status: AC
  Administered 2019-01-19: 10 mg

## 2019-01-19 NOTE — ED Provider Notes (Signed)
[facility]ERGENCY DEPARTMENT Provider Note   CSN: [id number] Arrival date & time: 01/19/19  1218    History   Chief Complaint Chief Complaint  Patient presents with  . Hip Pain    HPI Emma Wilson is a 63 y.o. female.     63 year old female presents with complaint of left lower back pain.  Complaint is acute on chronic, seen in urgent care 2 days ago and given tramadol which she is taking with limited relief.  Patient denies recent falls or injuries.  Patient was told to follow-up with her rheumatologist however her rheumatologist will be out of the country for the next 3 weeks and she is concerned that she will not be able to see her in a timely fashion.  Patient is followed by rheumatology for fibromyalgia.  Denies falls, injuries, groin numbness, abdominal pain, loss of bowel or bladder control or any other complaints or concerns.     Past Medical History:  Diagnosis Date  . Abnormality of gait 03/19/2014   one leg shorter than other"uses cane frequently"  . Carpal tunnel syndrome    right wrist  . Chronic back pain    DDD  . COPD (chronic obstructive pulmonary disease) ([place])   . Depression 2006   "after my son passed" (12/05/2013)  . Fibromyalgia   . GERD (gastroesophageal reflux disease)   . Hematuria    microscopic hematuria (urol & nephrol w/u neg), multiple UTI`s, glomerular from [facility], Dr. Jeffie Pollock 2014  . Hyperlipidemia    managed by Dr. Ouida Sills  . Hypothyroidism   . Leg length inequality    sees rheumatology  . Migraine    "q 3-4 months" (12/05/2013)  . Osteoarthritis    Rheumatoid and OA?  Dr. Titus Mould [facility] medical, rheumatology)  . Pneumonia    "abuntantly as a child"  . Rheumatoid arthritis (HCC)    Dr. Titus Mould  . Shortness of breath    "at rest" (12/05/2013)  . Sinus bradycardia   . Thyroid cancer ([place])    S/P radiation & OR    Patient Active Problem List   Diagnosis Date Noted  . Asymptomatic microscopic hematuria  06/26/2017  . History of kidney stones 05/24/2017  . History of thyroid cancer 05/24/2017  . Encounter for health maintenance examination in adult 07/26/2016  . Need for prophylactic vaccination and inoculation against influenza 07/26/2016  . History of COPD 07/26/2016  . Hereditary and idiopathic peripheral neuropathy 07/26/2016  . Decreased radial pulse 07/26/2016  . Decreased pulses in feet 07/26/2016  . Abnormality of gait 03/19/2014  . Pure hypercholesterolemia 01/24/2013  . Hematuria, unspecified 01/24/2013  . Fibromyalgia 01/24/2013  . Bradycardia 03/25/2011  . TOBACCO ABUSE 02/12/2010  . Hypothyroidism 02/11/2010  . COPD 02/11/2010  . GERD 02/11/2010    Past Surgical History:  Procedure Laterality Date  . COLONOSCOPY  10/13/2010   external and internal hemorrhoids, repeat 2021; Dr. Clarene Essex  . CYSTOSCOPY  02/26/2013   Dr. Jeffie Pollock (normal; follicular cystitis)  . EUS N/A 03/18/2016   Procedure: UPPER ENDOSCOPIC ULTRASOUND (EUS) RADIAL;  Surgeon: Milus Banister, MD;  Location: [facility];  Service: Endoscopy;  Laterality: N/A;  . THYROIDECTOMY, PARTIAL  11/2005; 11/2006  . TOTAL ABDOMINAL HYSTERECTOMY  ~ 1990   due to bleeding  . TUBAL LIGATION  1980's     OB History   No obstetric history on file.      Home Medications    Prior to Admission medications   Medication  Sig Start Date End Date Taking? Authorizing Provider  albuterol (PROAIR HFA) 108 (90 Base) MCG/ACT inhaler Inhale 2 puffs into the lungs every 6 (six) hours as needed for wheezing or shortness of breath. 01/14/16   Tysinger, Camelia Eng, PA-C  Ascorbic Acid (VITAMIN C) 1000 MG tablet Take 1,000 mg by mouth daily.    [provider]  atorvastatin (LIPITOR) 20 MG tablet Take 1 tablet (20 mg total) by mouth daily. 07/27/16   Tysinger, Camelia Eng, PA-C  BLACK ELDERBERRY PO Take by mouth.    [provider]  calcium gluconate 500 MG tablet Take 500 mg by mouth daily.    [provider]    cholecalciferol (VITAMIN D) 1000 UNITS tablet Take 2,000 Units by mouth daily.     [provider]  dexlansoprazole (DEXILANT) 60 MG capsule Take 1 capsule (60 mg total) by mouth daily. 07/27/16   Tysinger, Camelia Eng, PA-C  diclofenac sodium (VOLTAREN) 1 % GEL Apply 1 application topically 3 (three) times daily as needed (For pain.).     [provider]  gabapentin (NEURONTIN) 100 MG capsule Take 300 mg by mouth 2 (two) times daily.     [provider]  Ginger 500 MG CAPS Take by mouth.    [provider]  lidocaine (LIDODERM) 5 % Place 1 patch onto the skin daily. Remove & Discard patch within 12 hours or as directed by MD 01/19/19   Tacy Learn, PA-C  meloxicam (MOBIC) 7.5 MG tablet 7.5 mg daily.  08/17/18   [provider]  Multiple Vitamin (MULTIVITAMIN) tablet Take 1 tablet by mouth daily.    [provider]  Multiple Vitamins-Minerals (MULTIVITAMIN PO) Take by mouth.    [provider]  rosuvastatin (CRESTOR) 20 MG tablet Take 20 mg by mouth daily.    [provider]  SYNTHROID 137 MCG tablet Take 1 tablet (137 mcg total) by mouth daily before breakfast. 07/27/16   Tysinger, Camelia Eng, PA-C  tiZANidine (ZANAFLEX) 4 MG tablet 4 mg daily.  08/17/18   [provider]  traMADol (ULTRAM) 50 MG tablet Take 1 tablet (50 mg total) by mouth every 6 (six) hours as needed. 01/17/19   Vanessa Kick, MD  Turmeric 500 MG TABS Take by mouth.    [provider]    Family History Family History  Problem Relation Age of Onset  . Stroke Mother   . Coronary artery disease Mother   . Kidney disease Mother        on dialysis  . Heart disease Mother   . Cancer Father        multiple myeloma  . Arthritis Sister        rheumatoid and osteoarthritis  . Fibromyalgia Sister   . Arthritis Sister        RA and OA  . Diabetes Son     Social History Social History   Tobacco Use  . Smoking status: Current Every Day Smoker     Packs/day: 0.50    Years: 35.00    Pack years: 17.50    Types: Cigarettes  . Smokeless tobacco: Never Used  Substance Use Topics  . Alcohol use: Yes    Alcohol/week: 0.0 standard drinks    Comment: once to twice a week   . Drug use: No     Allergies   Patient has no known allergies.   Review of Systems Review of Systems  Constitutional: Negative for chills and fever.  Musculoskeletal: Positive  for arthralgias, back pain and myalgias.  Skin: Negative for color change, rash and wound.  Neurological: Negative for weakness and numbness.  Psychiatric/Behavioral: Negative for confusion.  All other systems reviewed and are negative.    Physical Exam Updated Vital Signs BP (!) 161/90 (BP Location: Right Arm)   Pulse 62   Temp 97.6 F (36.4 C) (Oral)   Resp 18   SpO2 100%   Physical Exam Vitals signs and nursing note reviewed.  Constitutional:      General: She is not in acute distress.    Appearance: She is well-developed. She is not diaphoretic.  HENT:     Head: Normocephalic and atraumatic.  Cardiovascular:     Pulses: Normal pulses.  Pulmonary:     Effort: Pulmonary effort is normal.  Abdominal:     Tenderness: There is no abdominal tenderness.  Musculoskeletal:        General: Tenderness present.     Lumbar back: She exhibits tenderness.       Back:       Legs:  Skin:    General: Skin is warm and dry.     Findings: No erythema or rash.  Neurological:     Mental Status: She is alert and oriented to person, place, and time.     GCS: GCS eye subscore is 4. GCS verbal subscore is 5. GCS motor subscore is 6.     Sensory: No sensory deficit.     Motor: No weakness.     Deep Tendon Reflexes: Reflexes normal. Babinski sign absent on the right side. Babinski sign absent on the left side.     Reflex Scores:      Patellar reflexes are 1+ on the right side and 1+ on the left side. Psychiatric:        Behavior: Behavior normal.      ED Treatments / Results   Labs (all labs ordered are listed, but only abnormal results are displayed) Labs Reviewed - No data to display  EKG None  Radiology No results found.  Procedures Procedures (including critical care time)  Medications Ordered in ED Medications  lidocaine (LIDODERM) 5 % 1 patch (1 patch Transdermal Patch Applied 01/19/19 1301)     Initial Impression / Assessment and Plan / ED Course  I have reviewed the triage vital signs and the nursing notes.  Pertinent labs & imaging results that were available during my care of the patient were reviewed by me and considered in my medical decision making (see chart for details).  Clinical Course as of Jan 19 1336  Fri Mar 06, 817  5491 63 year old female presents with complaint of pain in her left hip area without fall or injury.  Patient was seen in the urgent care 2 days ago and diagnosed with sciatica, given prescription for tramadol which she took with some relief.  She has a history of fibromyalgia, on exam she has general low back pain which she states is consistent with her fibromyalgia, she has tenderness to the lateral hip however no redness or swelling noted, negative logroll of left hip, leg reflexes symmetric and leg strength symmetric, straight leg raise negative.  Patient is taking tramadol, tizanidine, anti-inflammatory, applying diclofenac as well as her regular medications.  Patient was given Lidoderm patch and advised to follow-up with her PCP, may need physical therapy or other intervention.   [LM]    Clinical Course User Index [LM] Tacy Learn, PA-C   Final Clinical Impressions(s) / ED Diagnoses  Final diagnoses:  Chronic left-sided low back pain with left-sided sciatica    ED Discharge Orders         Ordered    lidocaine (LIDODERM) 5 %  Every 24 hours     01/19/19 1252           Tacy Learn, PA-C 01/19/19 1337    Quintella Reichert, MD 01/19/19 [phone]

## 2019-01-19 NOTE — Discharge Instructions (Addendum)
Continue with your regularly prescribed medications. Lidoderm patch applied today while in the emergency room, prescription for this is been sent to your pharmacy if this is helpful.  If this is not affordable or well covered by your insurance there are over-the-counter patches available.  Ask your pharmacist if you have questions. Follow-up with your doctor.

## 2019-01-19 NOTE — ED Triage Notes (Signed)
Patient reports different and worsening L hip pain that woke her up Wednesday morning. Patient reports she has been seen and treated for same at [facility] 2 days ago - told it was sciatica, but nothing additional done other than adding turmeric and ginger to prescribed medications. Patient took her own Tramadol at 2am this morning. Has cane, but "only uses it sometimes." Denies recent or new injury, ambulatory with steady gait in triage.

## 2019-01-19 NOTE — ED Notes (Signed)
Discharge instructions (including medications) discussed with and copy provided to patient/caregiver.  Pt verbalizes understanding of d/c instructions.  Armband removed, pt d/c from ED.

## 2019-01-21 NOTE — Progress Notes (Signed)
Subjective:   Patient ID: Emma Wilson, female   DOB: 63 y.o.   MRN: [id number]   HPI Patient presents with inflammation around the fifth MPJ right plantar with fluid buildup and also states that she has a lesion that is painful   ROS      Objective:  Physical Exam neurovascular status intact with patient's [place] inflammation fluid around the right fifth MPJ that is moderately painful when pressed with lesion formation that upon debridement shows pinpoint bleeding and pain to lateral pressure     Assessment:  Inflammatory capsulitis along with probable verruca plantaris plantar aspect right lateral foot measuring about 1 cm x 1 cm     Plan:  H&P condition reviewed and today I did sterile prep and then injected the plantar capsule 3 mg Dexasone Kenalog 5 mg Xylocaine and then for the lesion I debrided the lesion fully and applied medication to create immune response with instructions on usage along with sterile dressing.  Reappoint to recheck and instructed what to do if any blistering were to occur

## 2019-02-14 ENCOUNTER — Telehealth: Payer: Self-pay | Admitting: Cardiology

## 2019-02-14 NOTE — Telephone Encounter (Signed)
Follow up: ° ° °Patient returning call back °

## 2019-02-14 NOTE — Telephone Encounter (Signed)
°  Patient returning call  Please return call to patient.

## 2019-02-14 NOTE — Telephone Encounter (Signed)
Pt called to ask for advice re: her job at the [employer].. she says she is worried because they have not taken any precautions and since considered essential.. there has been a line of people out the door the past 2 days with it being the first of the month a typical busy time... she says people are standing in line and not keeping a safe distance.. I urged her to talk with her manager and request some added precautions and let them know her concerns. If not, talk with her PMD re: his recommendation for her going forward.. pt very appreciative for the call. Will forward to Dr. Stanford Breed.

## 2019-02-14 NOTE — Telephone Encounter (Signed)
New Message  Patient is calling with concerns of being in the high risk category what steps do she need to take to protect herself from [phone]. She said she took herself out of work. Please call to discuss.

## 2019-05-10 ENCOUNTER — Encounter (HOSPITAL_COMMUNITY): Payer: Self-pay | Admitting: Emergency Medicine

## 2019-05-10 ENCOUNTER — Ambulatory Visit (HOSPITAL_COMMUNITY)
Admission: EM | Admit: 2019-05-10 | Discharge: 2019-05-10 | Disposition: A | Discharge status: Home / Self Care | Payer: 59 | Attending: Emergency Medicine | Admitting: Emergency Medicine

## 2019-05-10 ENCOUNTER — Other Ambulatory Visit: Payer: Self-pay

## 2019-05-10 DIAGNOSIS — L089 Local infection of the skin and subcutaneous tissue, unspecified: Secondary | ICD-10-CM | POA: Insufficient documentation

## 2019-05-10 DIAGNOSIS — Z79899 Other long term (current) drug therapy: Secondary | ICD-10-CM | POA: Insufficient documentation

## 2019-05-10 DIAGNOSIS — S70361A Insect bite (nonvenomous), right thigh, initial encounter: Secondary | ICD-10-CM | POA: Insufficient documentation

## 2019-05-10 DIAGNOSIS — R269 Unspecified abnormalities of gait and mobility: Secondary | ICD-10-CM | POA: Insufficient documentation

## 2019-05-10 DIAGNOSIS — E039 Hypothyroidism, unspecified: Secondary | ICD-10-CM | POA: Diagnosis not present

## 2019-05-10 DIAGNOSIS — W57XXXA Bitten or stung by nonvenomous insect and other nonvenomous arthropods, initial encounter: Secondary | ICD-10-CM | POA: Insufficient documentation

## 2019-05-10 DIAGNOSIS — M549 Dorsalgia, unspecified: Secondary | ICD-10-CM | POA: Diagnosis not present

## 2019-05-10 DIAGNOSIS — F17210 Nicotine dependence, cigarettes, uncomplicated: Secondary | ICD-10-CM | POA: Insufficient documentation

## 2019-05-10 DIAGNOSIS — E785 Hyperlipidemia, unspecified: Secondary | ICD-10-CM | POA: Diagnosis not present

## 2019-05-10 DIAGNOSIS — S40862A Insect bite (nonvenomous) of left upper arm, initial encounter: Secondary | ICD-10-CM | POA: Diagnosis not present

## 2019-05-10 DIAGNOSIS — M797 Fibromyalgia: Secondary | ICD-10-CM | POA: Diagnosis not present

## 2019-05-10 DIAGNOSIS — M069 Rheumatoid arthritis, unspecified: Secondary | ICD-10-CM | POA: Insufficient documentation

## 2019-05-10 DIAGNOSIS — G8929 Other chronic pain: Secondary | ICD-10-CM | POA: Diagnosis not present

## 2019-05-10 MED ORDER — DOXYCYCLINE HYCLATE 100 MG PO CAPS: 100.0000 mg | ORAL_CAPSULE | Freq: Two times a day (BID) | ORAL | 0 refills | Status: AC

## 2019-05-10 NOTE — ED Triage Notes (Signed)
Pt removed a tick from her left upper arm on Sunday.  She has redness and swelling to the area.

## 2019-05-10 NOTE — ED Provider Notes (Signed)
HPI  SUBJECTIVE:  Emma Wilson is a 63 y.o. female who presents with a pruritic erythematous area on her left upper arm where she pulled an engorged tick off 6 days ago.  She thinks that it was on for at least 48 hours.  She is not sure what kind of tick it was. States that the erythema is getting bigger.  Denies pain.  She denies fevers, body aches, flulike symptoms, rash, neck stiffness, photophobia, chest pain, abdominal pain.  No crusting, drainage.  She is been applying Noxzema and an prescription antifungal cream without improvement in her symptoms.  No other aggravating or alleviating factors.  Second, she reports a similar itchy nonpainful "bug bite" on her right upper inner thigh also starting a week ago.  She did not pull a tick off from this area.  She reports some swelling.  No fluctuance, drainage.  Has also tried Noxzema and the antifungal cream without improvement in her symptoms.  No other aggravating or alleviating factors.  She states that the erythema seems to be getting larger.  No contacts with MRSA.  She has a past medical history of fibromyalgia, COPD, rheumatoid arthritis.  She is not on any chronic steroids.  No history of abscess, MRSA.  No history of tickborne disease.  [id number], Mauro Kaufmann, MD   Past Medical History:  Diagnosis Date  . Abnormality of gait 03/19/2014   one leg shorter than other"uses cane frequently"  . Carpal tunnel syndrome    right wrist  . Chronic back pain    DDD  . COPD (chronic obstructive pulmonary disease) ([place])   . Depression 2006   "after my son passed" (12/05/2013)  . Fibromyalgia   . GERD (gastroesophageal reflux disease)   . Hematuria    microscopic hematuria (urol & nephrol w/u neg), multiple UTI`s, glomerular from [facility], Dr. Jeffie Pollock 2014  . Hyperlipidemia    managed by Dr. Ouida Sills  . Hypothyroidism   . Leg length inequality    sees rheumatology  . Migraine    "q 3-4 months" (12/05/2013)  . Osteoarthritis    Rheumatoid  and OA?  Dr. Titus Mould [facility] medical, rheumatology)  . Pneumonia    "abuntantly as a child"  . Rheumatoid arthritis (HCC)    Dr. Titus Mould  . Shortness of breath    "at rest" (12/05/2013)  . Sinus bradycardia   . Thyroid cancer ([place])    S/P radiation & OR    Past Surgical History:  Procedure Laterality Date  . COLONOSCOPY  10/13/2010   external and internal hemorrhoids, repeat 2021; Dr. Clarene Essex  . CYSTOSCOPY  02/26/2013   Dr. Jeffie Pollock (normal; follicular cystitis)  . EUS N/A 03/18/2016   Procedure: UPPER ENDOSCOPIC ULTRASOUND (EUS) RADIAL;  Surgeon: Milus Banister, MD;  Location: [facility];  Service: Endoscopy;  Laterality: N/A;  . THYROIDECTOMY, PARTIAL  11/2005; 11/2006  . TOTAL ABDOMINAL HYSTERECTOMY  ~ 1990   due to bleeding  . TUBAL LIGATION  1980's    Family History  Problem Relation Age of Onset  . Stroke Mother   . Coronary artery disease Mother   . Kidney disease Mother        on dialysis  . Heart disease Mother   . Cancer Father        multiple myeloma  . Arthritis Sister        rheumatoid and osteoarthritis  . Fibromyalgia Sister   . Arthritis Sister        RA and OA  .  Diabetes Son     Social History   Tobacco Use  . Smoking status: Current Every Day Smoker    Packs/day: 0.50    Years: 35.00    Pack years: 17.50    Types: Cigarettes  . Smokeless tobacco: Never Used  Substance Use Topics  . Alcohol use: Yes    Alcohol/week: 0.0 standard drinks    Comment: once to twice a week   . Drug use: No     Current Facility-Administered Medications:  .  triamcinolone acetonide (KENALOG) 10 MG/ML injection 10 mg, 10 mg, Other, Once, Regal, Tamala Fothergill, DPM  Current Outpatient Medications:  .  Ascorbic Acid (VITAMIN C) 1000 MG tablet, Take 1,000 mg by mouth daily., Disp: , Rfl:  .  atorvastatin (LIPITOR) 20 MG tablet, Take 1 tablet (20 mg total) by mouth daily., Disp: 90 tablet, Rfl: 3 .  BLACK ELDERBERRY PO, Take by mouth., Disp: , Rfl:  .  calcium gluconate 500 MG  tablet, Take 500 mg by mouth daily., Disp: , Rfl:  .  cholecalciferol (VITAMIN D) 1000 UNITS tablet, Take 2,000 Units by mouth daily. , Disp: , Rfl:  .  dexlansoprazole (DEXILANT) 60 MG capsule, Take 1 capsule (60 mg total) by mouth daily., Disp: 90 capsule, Rfl: 3 .  diclofenac sodium (VOLTAREN) 1 % GEL, Apply 1 application topically 3 (three) times daily as needed (For pain.). , Disp: , Rfl:  .  gabapentin (NEURONTIN) 100 MG capsule, Take 300 mg by mouth 2 (two) times daily. , Disp: , Rfl:  .  Ginger 500 MG CAPS, Take by mouth., Disp: , Rfl:  .  lidocaine (LIDODERM) 5 %, Place 1 patch onto the skin daily. Remove & Discard patch within 12 hours or as directed by MD, Disp: 30 patch, Rfl: 0 .  meloxicam (MOBIC) 7.5 MG tablet, 7.5 mg daily. , Disp: , Rfl: 2 .  Multiple Vitamin (MULTIVITAMIN) tablet, Take 1 tablet by mouth daily., Disp: , Rfl:  .  Multiple Vitamins-Minerals (MULTIVITAMIN PO), Take by mouth., Disp: , Rfl:  .  rosuvastatin (CRESTOR) 20 MG tablet, Take 20 mg by mouth daily., Disp: , Rfl:  .  SYNTHROID 137 MCG tablet, Take 1 tablet (137 mcg total) by mouth daily before breakfast., Disp: 90 tablet, Rfl: 1 .  tiZANidine (ZANAFLEX) 4 MG tablet, 4 mg daily. , Disp: , Rfl: 2 .  traMADol (ULTRAM) 50 MG tablet, Take 1 tablet (50 mg total) by mouth every 6 (six) hours as needed., Disp: 12 tablet, Rfl: 0 .  Turmeric 500 MG TABS, Take by mouth., Disp: , Rfl:  .  albuterol (PROAIR HFA) 108 (90 Base) MCG/ACT inhaler, Inhale 2 puffs into the lungs every 6 (six) hours as needed for wheezing or shortness of breath., Disp: 18 g, Rfl: 0 .  doxycycline (VIBRAMYCIN) 100 MG capsule, Take 1 capsule (100 mg total) by mouth 2 (two) times daily for 14 days., Disp: 28 capsule, Rfl: 0  No Known Allergies   ROS  As noted in HPI.   Physical Exam  BP (!) 151/90 (BP Location: Left Arm)   Pulse 61   Temp 98 F (36.7 C) (Oral)   Resp 16   SpO2 99%  Constitutional: Well developed, well nourished, no acute  distress Eyes:  EOMI, conjunctiva normal bilaterally HENT: Normocephalic, atraumatic,mucus membranes moist Respiratory: Normal inspiratory effort Cardiovascular: Normal rate GI: nondistended skin: Left lateral arm.  Nontender area of blanchable erythema with central clearing with some induration.  No central fluctuance, expressible  purulent drainage.  Area of erythema measures 7.5 x 4 cm.  Marked area of erythema with a marker for reference   Right medial upper thigh: Nontender blanchable area of erythema without induration, with a central lesion measuring 4 x 2.5 cm.  No fluctuance or expressible purulent drainage.  Marked area of erythema with a permanent marker for reference     Musculoskeletal: no deformities Neurologic: Alert & oriented x 3, no focal neuro deficits Psychiatric: Speech and behavior appropriate   ED Course   Medications - No data to display  Orders Placed This Encounter  Procedures  . [place] spotted fvr abs pnl(IgG+IgM)    Standing Status:   Standing    Number of Occurrences:   1  . B. burgdorfi antibodies    Standing Status:   Standing    Number of Occurrences:   1    No results found for this or any previous visit (from the past 24 hour(s)). No results found.  ED Clinical Impression  1. Infected tick bite, initial encounter   2. Infected insect bite of right thigh, initial encounter      ED Assessment/Plan  Patient with an infected tick bite, and another sort of insect bite on her thigh.  There is nothing to I&D.  Will send home with doxycycline 100 mg p.o. twice daily for 14 days to cover tickborne disease.  We will send off [facility] spotted fever and Lyme titers.  Will extend doxycycline to 3 weeks if Lyme titers come back positive.  Warm compresses as needed.  Follow-up with PMD as scheduled next week, to the ER if she gets worse.  Discussed  MDM, treatment plan, and plan for follow-up with patient. Discussed sn/sx that should prompt  return to the ED. patient agrees with plan.   Meds ordered this encounter  Medications  . doxycycline (VIBRAMYCIN) 100 MG capsule    Sig: Take 1 capsule (100 mg total) by mouth 2 (two) times daily for 14 days.    Dispense:  28 capsule    Refill:  0    *This clinic note was created using [employer]. Therefore, there may be occasional mistakes despite careful proofreading.   ?   Melynda Ripple, MD 05/10/19 [phone]

## 2019-05-10 NOTE — Discharge Instructions (Addendum)
I am sending off [facility] spotted fever and Lyme disease blood work today.  We will contact you if the results come back positive.  You are covered for [facility] spotted fever, however if your lab work comes back positive for Lyme disease, I will extend your antibiotic course another week that you will be taking it for 3 weeks total.  You may do warm compresses to the area.

## 2019-05-11 LAB — B. BURGDORFI ANTIBODIES: B burgdorferi Ab IgG+IgM: <0.91 {ISR} (ref 0.00–0.90)

## 2019-05-12 LAB — ROCKY MTN SPOTTED FVR ABS PNL(IGG+IGM)
RMSF IgG: NEGATIVE
RMSF IgM: 0.4 index (ref 0.00–0.89)

## 2019-05-14 ENCOUNTER — Telehealth (HOSPITAL_COMMUNITY): Payer: Self-pay | Admitting: Emergency Medicine

## 2019-05-14 NOTE — Telephone Encounter (Signed)
Negative blood, attempted to reach patient, no answer

## 2019-05-16 ENCOUNTER — Telehealth (HOSPITAL_COMMUNITY): Payer: Self-pay | Admitting: Emergency Medicine

## 2019-05-16 NOTE — Telephone Encounter (Signed)
Pt called and left vm stating it was okay to leave results on her voicemail. Called pt back and left vm stating blood tests were negative.

## 2019-05-28 ENCOUNTER — Other Ambulatory Visit: Payer: Self-pay

## 2019-05-28 ENCOUNTER — Ambulatory Visit (INDEPENDENT_AMBULATORY_CARE_PROVIDER_SITE_OTHER): Payer: 59 | Admitting: Podiatry

## 2019-05-28 ENCOUNTER — Encounter: Payer: Self-pay | Admitting: Podiatry

## 2019-05-28 VITALS — Temp 97.7°F

## 2019-05-28 DIAGNOSIS — B070 Plantar wart: Secondary | ICD-10-CM

## 2019-05-28 DIAGNOSIS — M7751 Other enthesopathy of right foot: Secondary | ICD-10-CM | POA: Diagnosis not present

## 2019-05-28 DIAGNOSIS — M778 Other enthesopathies, not elsewhere classified: Secondary | ICD-10-CM

## 2019-05-28 NOTE — Progress Notes (Signed)
Subjective:   Patient ID: Emma Wilson, female   DOB: 63 y.o.   MRN: [id number]   HPI Patient states that the area on the bottom of her right foot has come back after about 5 months and there is also inflammation in the right plantar foot that makes it hard to wear shoe gear comfortably   ROS      Objective:  Physical Exam  Neurovascular status intact with inflammation pain of the fifth metatarsal base right with fluid buildup noted and keratotic lesion plantar right that upon debridement shows pinpoint bleeding     Assessment:  Inflammatory capsulitis of the fifth metatarsal base with patient having a separate lesion plantar right that appears to be verruca plantaris     Plan:  H&P discussed both conditions and today I did inject the plantar capsule 3 mg Kenalog 5 mg Xylocaine to reduce the inflammation and I then used sterile sharp instrumentation debrided lesion and applied chemical agent to create immune response along with sterile dressing.  Reappoint to recheck

## 2019-06-11 ENCOUNTER — Telehealth: Payer: Self-pay | Admitting: *Deleted

## 2019-06-11 NOTE — Telephone Encounter (Signed)
A message was left, re: follow up visit. 

## 2019-10-01 NOTE — Progress Notes (Deleted)
HPI: FU CP.Myoview August of 2011 revealed EF of 53 and no ischemia. Echocardiogram January 2015 showed normal LV function and grade 1 diastolic dysfunction. Exercise treadmill March 2015 normal. ABIs 9/17 normal.  Seen for a syncopal episode April 2018.  Event monitor 5/18 showed sinus with PVC. Since last seen,   Current Outpatient Medications  Medication Sig Dispense Refill  . albuterol (PROAIR HFA) 108 (90 Base) MCG/ACT inhaler Inhale 2 puffs into the lungs every 6 (six) hours as needed for wheezing or shortness of breath. 18 g 0  . Ascorbic Acid (VITAMIN C) 1000 MG tablet Take 1,000 mg by mouth daily.    Marland Kitchen atorvastatin (LIPITOR) 20 MG tablet Take 1 tablet (20 mg total) by mouth daily. 90 tablet 3  . BLACK ELDERBERRY PO Take by mouth.    . calcium gluconate 500 MG tablet Take 500 mg by mouth daily.    . cholecalciferol (VITAMIN D) 1000 UNITS tablet Take 2,000 Units by mouth daily.     Marland Kitchen dexlansoprazole (DEXILANT) 60 MG capsule Take 1 capsule (60 mg total) by mouth daily. 90 capsule 3  . diclofenac sodium (VOLTAREN) 1 % GEL Apply 1 application topically 3 (three) times daily as needed (For pain.).     Marland Kitchen gabapentin (NEURONTIN) 100 MG capsule Take 300 mg by mouth 2 (two) times daily.     Marland Kitchen gabapentin (NEURONTIN) 300 MG capsule TK 1 C PO BID    . Ginger 500 MG CAPS Take by mouth.    . lidocaine (LIDODERM) 5 % Place 1 patch onto the skin daily. Remove & Discard patch within 12 hours or as directed by MD 30 patch 0  . meloxicam (MOBIC) 7.5 MG tablet 7.5 mg daily.   2  . Multiple Vitamin (MULTIVITAMIN) tablet Take 1 tablet by mouth daily.    . Multiple Vitamins-Minerals (MULTIVITAMIN PO) Take by mouth.    . rosuvastatin (CRESTOR) 20 MG tablet Take 20 mg by mouth daily.    Marland Kitchen SYNTHROID 137 MCG tablet Take 1 tablet (137 mcg total) by mouth daily before breakfast. 90 tablet 1  . tiZANidine (ZANAFLEX) 4 MG tablet 4 mg daily.   2  . traMADol (ULTRAM) 50 MG tablet Take 1 tablet (50 mg total)  by mouth every 6 (six) hours as needed. 12 tablet 0  . Turmeric 500 MG TABS Take by mouth.     Current Facility-Administered Medications  Medication Dose Route Frequency Provider Last Rate Last Dose  . triamcinolone acetonide (KENALOG) 10 MG/ML injection 10 mg  10 mg Other Once Wallene Huh, DPM         Past Medical History:  Diagnosis Date  . Abnormality of gait 03/19/2014   one leg shorter than other"uses cane frequently"  . Carpal tunnel syndrome    right wrist  . Chronic back pain    DDD  . COPD (chronic obstructive pulmonary disease) ([place])   . Depression 2006   "after my son passed" (12/05/2013)  . Fibromyalgia   . GERD (gastroesophageal reflux disease)   . Hematuria    microscopic hematuria (urol & nephrol w/u neg), multiple UTI`s, glomerular from [facility], Dr. Jeffie Pollock 2014  . Hyperlipidemia    managed by Dr. Ouida Sills  . Hypothyroidism   . Leg length inequality    sees rheumatology  . Migraine    "q 3-4 months" (12/05/2013)  . Osteoarthritis    Rheumatoid and OA?  Dr. Titus Mould [facility] medical, rheumatology)  . Pneumonia    "  abuntantly as a child"  . Rheumatoid arthritis (HCC)    Dr. Titus Mould  . Shortness of breath    "at rest" (12/05/2013)  . Sinus bradycardia   . Thyroid cancer ([place])    S/P radiation & OR    Past Surgical History:  Procedure Laterality Date  . COLONOSCOPY  10/13/2010   external and internal hemorrhoids, repeat 2021; Dr. Clarene Essex  . CYSTOSCOPY  02/26/2013   Dr. Jeffie Pollock (normal; follicular cystitis)  . EUS N/A 03/18/2016   Procedure: UPPER ENDOSCOPIC ULTRASOUND (EUS) RADIAL;  Surgeon: Milus Banister, MD;  Location: [facility];  Service: Endoscopy;  Laterality: N/A;  . THYROIDECTOMY, PARTIAL  11/2005; 11/2006  . TOTAL ABDOMINAL HYSTERECTOMY  ~ 1990   due to bleeding  . TUBAL LIGATION  1980's    Social History   Socioeconomic History  . Marital status: Divorced    Spouse name: Not on file  . Number of children: 3  . Years of education: college   . Highest education level: Not on file  Occupational History  . Occupation: Armed forces operational officer: [employer]  Social Needs  . Financial resource strain: Not on file  . Food insecurity    Worry: Not on file    Inability: Not on file  . Transportation needs    Medical: Not on file    Non-medical: Not on file  Tobacco Use  . Smoking status: Current Every Day Smoker    Packs/day: 0.50    Years: 35.00    Pack years: 17.50    Types: Cigarettes  . Smokeless tobacco: Never Used  Substance and Sexual Activity  . Alcohol use: Yes    Alcohol/week: 0.0 standard drinks    Comment: once to twice a week   . Drug use: No  . Sexual activity: Not Currently    Partners: Male  Lifestyle  . Physical activity    Days per week: Not on file    Minutes per session: Not on file  . Stress: Not on file  Relationships  . Social Herbalist on phone: Not on file    Gets together: Not on file    Attends religious service: Not on file    Active member of club or organization: Not on file    Attends meetings of clubs or organizations: Not on file    Relationship status: Not on file  . Intimate partner violence    Fear of current or ex partner: Not on file    Emotionally abused: Not on file    Physically abused: Not on file    Forced sexual activity: Not on file  Other Topics Concern  . Not on file  Social History Narrative   Lives alone.  2 living children (1 deceased), 1 in [place], 1 in [place], 6 grandchildren.  Works at [employer].   Exercise some.  As of 07/2016    Family History  Problem Relation Age of Onset  . Stroke Mother   . Coronary artery disease Mother   . Kidney disease Mother        on dialysis  . Heart disease Mother   . Cancer Father        multiple myeloma  . Arthritis Sister        rheumatoid and osteoarthritis  . Fibromyalgia Sister   . Arthritis Sister        RA and OA  . Diabetes Son     ROS: no fevers  or chills, productive cough, hemoptysis,  dysphasia, odynophagia, melena, hematochezia, dysuria, hematuria, rash, seizure activity, orthopnea, PND, pedal edema, claudication. Remaining systems are negative.  Physical Exam: Well-developed well-nourished in no acute distress.  Skin is warm and dry.  HEENT is normal.  Neck is supple.  Chest is clear to auscultation with normal expansion.  Cardiovascular exam is regular rate and rhythm.  Abdominal exam nontender or distended. No masses palpated. Extremities show no edema. neuro grossly intact  ECG- personally reviewed  A/P  1 chronic chest pain-patient continues with chronic atypical chest pain.  We will arrange cardiac CTA to rule out significant coronary disease.   2 tobacco abuse-patient counseled on discontinuing.  3 palpitations-symptoms well controlled.  We will add a beta-blocker in the future if needed.  4 hyperlipidemia-continue statin.  5 syncope-no recurrent episodes.  Kirk Ruths, MD

## 2019-10-04 ENCOUNTER — Ambulatory Visit: Payer: 59 | Admitting: Cardiology

## 2019-12-07 NOTE — Progress Notes (Signed)
HPI: FU CP.Myoview August of 2011 revealed EF of 53 and no ischemia. Echocardiogram January 2015 showed normal LV function and grade 1 diastolic dysfunction. Exercise treadmill March 2015 normal. ABIs 9/17 normal.  Seen for a syncopal episode April 2018.  Event monitor 5/18 showed sinus with PVC. Since last seen,  there is no increased dyspnea, chest pain or syncope.  Occasional brief palpitations not sustained.  Current Outpatient Medications  Medication Sig Dispense Refill  . Ascorbic Acid (VITAMIN C) 1000 MG tablet Take 1,000 mg by mouth daily.    Marland Kitchen atorvastatin (LIPITOR) 20 MG tablet Take 1 tablet (20 mg total) by mouth daily. 90 tablet 3  . BLACK ELDERBERRY PO Take by mouth.    . calcium gluconate 500 MG tablet Take 500 mg by mouth daily.    . cholecalciferol (VITAMIN D) 1000 UNITS tablet Take 2,000 Units by mouth daily.     . diclofenac sodium (VOLTAREN) 1 % GEL Apply 1 application topically 3 (three) times daily as needed (For pain.).     Marland Kitchen gabapentin (NEURONTIN) 300 MG capsule Take 300 mg by mouth 3 (three) times daily.     . meloxicam (MOBIC) 7.5 MG tablet 7.5 mg daily.   2  . Multiple Vitamin (MULTIVITAMIN) tablet Take 1 tablet by mouth daily.    . rosuvastatin (CRESTOR) 20 MG tablet Take 20 mg by mouth daily.    Marland Kitchen SYNTHROID 137 MCG tablet Take 1 tablet (137 mcg total) by mouth daily before breakfast. 90 tablet 1  . tiZANidine (ZANAFLEX) 4 MG tablet 4 mg daily.   2  . Turmeric 500 MG TABS Take by mouth.     No current facility-administered medications for this visit.     Past Medical History:  Diagnosis Date  . Abnormality of gait 03/19/2014   one leg shorter than other"uses cane frequently"  . Carpal tunnel syndrome    right wrist  . Chronic back pain    DDD  . COPD (chronic obstructive pulmonary disease) ([place])   . Depression 2006   "after my son passed" (12/05/2013)  . Fibromyalgia   . GERD (gastroesophageal reflux disease)   . Hematuria    microscopic  hematuria (urol & nephrol w/u neg), multiple UTI`s, glomerular from [facility], Dr. Jeffie Pollock 2014  . Hyperlipidemia    managed by Dr. Ouida Sills  . Hypothyroidism   . Leg length inequality    sees rheumatology  . Migraine    "q 3-4 months" (12/05/2013)  . Osteoarthritis    Rheumatoid and OA?  Dr. Titus Mould [facility] medical, rheumatology)  . Pneumonia    "abuntantly as a child"  . Rheumatoid arthritis (HCC)    Dr. Titus Mould  . Shortness of breath    "at rest" (12/05/2013)  . Sinus bradycardia   . Thyroid cancer ([place])    S/P radiation & OR    Past Surgical History:  Procedure Laterality Date  . COLONOSCOPY  10/13/2010   external and internal hemorrhoids, repeat 2021; Dr. Clarene Essex  . CYSTOSCOPY  02/26/2013   Dr. Jeffie Pollock (normal; follicular cystitis)  . EUS N/A 03/18/2016   Procedure: UPPER ENDOSCOPIC ULTRASOUND (EUS) RADIAL;  Surgeon: Milus Banister, MD;  Location: [facility];  Service: Endoscopy;  Laterality: N/A;  . THYROIDECTOMY, PARTIAL  11/2005; 11/2006  . TOTAL ABDOMINAL HYSTERECTOMY  ~ 1990   due to bleeding  . TUBAL LIGATION  1980's    Social History   Socioeconomic History  . Marital status: Divorced  Spouse name: Not on file  . Number of children: 3  . Years of education: college  . Highest education level: Not on file  Occupational History  . Occupation: Armed forces operational officer: [employer]  Tobacco Use  . Smoking status: Current Every Day Smoker    Packs/day: 0.50    Years: 35.00    Pack years: 17.50    Types: Cigarettes  . Smokeless tobacco: Never Used  Substance and Sexual Activity  . Alcohol use: Yes    Alcohol/week: 0.0 standard drinks    Comment: once to twice a week   . Drug use: No  . Sexual activity: Not Currently    Partners: Male  Other Topics Concern  . Not on file  Social History Narrative   Lives alone.  2 living children (1 deceased), 1 in [place], 1 in [place], 6 grandchildren.  Works at [employer].   Exercise some.  As of 07/2016   Social  Determinants of Health   Financial Resource Strain:   . Difficulty of Paying Living Expenses: Not on file  Food Insecurity:   . Worried About Charity fundraiser in the Last Year: Not on file  . Ran Out of Food in the Last Year: Not on file  Transportation Needs:   . Lack of Transportation (Medical): Not on file  . Lack of Transportation (Non-Medical): Not on file  Physical Activity:   . Days of Exercise per Week: Not on file  . Minutes of Exercise per Session: Not on file  Stress:   . Feeling of Stress : Not on file  Social Connections:   . Frequency of Communication with Friends and Family: Not on file  . Frequency of Social Gatherings with Friends and Family: Not on file  . Attends Religious Services: Not on file  . Active Member of Clubs or Organizations: Not on file  . Attends Archivist Meetings: Not on file  . Marital Status: Not on file  Intimate Partner Violence:   . Fear of Current or Ex-Partner: Not on file  . Emotionally Abused: Not on file  . Physically Abused: Not on file  . Sexually Abused: Not on file    Family History  Problem Relation Age of Onset  . Stroke Mother   . Coronary artery disease Mother   . Kidney disease Mother        on dialysis  . Heart disease Mother   . Cancer Father        multiple myeloma  . Arthritis Sister        rheumatoid and osteoarthritis  . Fibromyalgia Sister   . Arthritis Sister        RA and OA  . Diabetes Son     ROS: no fevers or chills, productive cough, hemoptysis, dysphasia, odynophagia, melena, hematochezia, dysuria, hematuria, rash, seizure activity, orthopnea, PND, pedal edema, claudication. Remaining systems are negative.  Physical Exam: Well-developed well-nourished in no acute distress.  Skin is warm and dry.  HEENT is normal.  Neck is supple.  Chest is clear to auscultation with normal expansion.  Cardiovascular exam is regular rate and rhythm.  Abdominal exam nontender or distended. No masses  palpated. Extremities show no edema. neuro grossly intact  ECG-sinus bradycardia at a rate of 58, normal axis, nonspecific ST changes.  Personally reviewed  A/P  1 Chest pain-no recent symptoms.  No further evaluation at this time.  2 hyperlipidemia-continue statin; monitored by primary care.  3 palpitations-symptoms  well controlled; will consider beta blocker in the future if needed.  4 tobacco abuse-counseled on discontinuing.  5 h/o syncope-no recurrences.  Kirk Ruths, MD

## 2019-12-11 ENCOUNTER — Other Ambulatory Visit: Payer: Self-pay

## 2019-12-11 ENCOUNTER — Encounter: Payer: Self-pay | Admitting: *Deleted

## 2019-12-11 ENCOUNTER — Ambulatory Visit (INDEPENDENT_AMBULATORY_CARE_PROVIDER_SITE_OTHER): Payer: 59 | Admitting: Cardiology

## 2019-12-11 ENCOUNTER — Encounter: Payer: Self-pay | Admitting: Cardiology

## 2019-12-11 VITALS — BP 126/84 | HR 58 | Ht 67.0 in | Wt 139.2 lb

## 2019-12-11 DIAGNOSIS — R002 Palpitations: Secondary | ICD-10-CM

## 2019-12-11 DIAGNOSIS — F17200 Nicotine dependence, unspecified, uncomplicated: Secondary | ICD-10-CM

## 2019-12-11 DIAGNOSIS — E7800 Pure hypercholesterolemia, unspecified: Secondary | ICD-10-CM

## 2019-12-11 DIAGNOSIS — R079 Chest pain, unspecified: Secondary | ICD-10-CM

## 2019-12-11 NOTE — Patient Instructions (Signed)
Medication Instructions:  NO CHANGE *If you need a refill on your cardiac medications before your next appointment, please call your pharmacy*  Lab Work: If you have labs (blood work) drawn today and your tests are completely normal, you will receive your results only by: . MyChart Message (if you have MyChart) OR . A paper copy in the mail If you have any lab test that is abnormal or we need to change your treatment, we will call you to review the results.  Follow-Up: At [facility], you and your health needs are our priority.  As part of our continuing mission to provide you with exceptional heart care, we have created designated Provider Care Teams.  These Care Teams include your primary Cardiologist (physician) and Advanced Practice Providers (APPs -  Physician Assistants and Nurse Practitioners) who all work together to provide you with the care you need, when you need it.  Your next appointment:   12 month(s)  The format for your next appointment:   Either In Person or Virtual  Provider:   You may see Brian Crenshaw, MD or one of the following Advanced Practice Providers on your designated Care Team:    Luke Kilroy, PA-C  Callie Goodrich, PA-C  Jesse Cleaver, FNP    

## 2020-04-23 ENCOUNTER — Encounter (HOSPITAL_COMMUNITY): Payer: Self-pay | Admitting: Emergency Medicine

## 2020-04-23 ENCOUNTER — Other Ambulatory Visit: Payer: Self-pay

## 2020-04-23 ENCOUNTER — Telehealth: Payer: Self-pay | Admitting: Cardiology

## 2020-04-23 ENCOUNTER — Emergency Department (HOSPITAL_COMMUNITY): Payer: 59

## 2020-04-23 ENCOUNTER — Emergency Department (HOSPITAL_COMMUNITY)
Admission: EM | Admit: 2020-04-23 | Discharge: 2020-04-23 | Disposition: A | Discharge status: Home / Self Care | Payer: 59 | Attending: Emergency Medicine | Admitting: Emergency Medicine

## 2020-04-23 DIAGNOSIS — F17210 Nicotine dependence, cigarettes, uncomplicated: Secondary | ICD-10-CM | POA: Diagnosis not present

## 2020-04-23 DIAGNOSIS — E039 Hypothyroidism, unspecified: Secondary | ICD-10-CM | POA: Insufficient documentation

## 2020-04-23 DIAGNOSIS — M069 Rheumatoid arthritis, unspecified: Secondary | ICD-10-CM | POA: Diagnosis not present

## 2020-04-23 DIAGNOSIS — J441 Chronic obstructive pulmonary disease with (acute) exacerbation: Secondary | ICD-10-CM | POA: Diagnosis not present

## 2020-04-23 DIAGNOSIS — Z79899 Other long term (current) drug therapy: Secondary | ICD-10-CM | POA: Insufficient documentation

## 2020-04-23 DIAGNOSIS — R0602 Shortness of breath: Secondary | ICD-10-CM | POA: Diagnosis present

## 2020-04-23 LAB — CBC
HCT: 34.8 % — ABNORMAL LOW (ref 36.0–46.0)
Hemoglobin: 12 g/dL (ref 12.0–15.0)
MCH: 29.3 pg (ref 26.0–34.0)
MCHC: 34.5 g/dL (ref 30.0–36.0)
MCV: 85.1 fL (ref 80.0–100.0)
Platelets: 239 10*3/uL (ref 150–400)
RBC: 4.09 MIL/uL (ref 3.87–5.11)
RDW: 14.6 % (ref 11.5–15.5)
WBC: 4.3 10*3/uL (ref 4.0–10.5)
nRBC: 0 % (ref 0.0–0.2)

## 2020-04-23 LAB — BASIC METABOLIC PANEL
Anion gap: 8 (ref 5–15)
BUN: 11 mg/dL (ref 8–23)
CO2: 23 mmol/L (ref 22–32)
Calcium: 8.7 mg/dL — ABNORMAL LOW (ref 8.9–10.3)
Chloride: 105 mmol/L (ref 98–111)
Creatinine, Ser: 0.92 mg/dL (ref 0.44–1.00)
GFR calc Af Amer: >60 mL/min (ref >60)
GFR calc non Af Amer: >60 mL/min (ref >60)
Glucose, Bld: 94 mg/dL (ref 70–99)
Potassium: 3.6 mmol/L (ref 3.5–5.1)
Sodium: 136 mmol/L (ref 135–145)

## 2020-04-23 LAB — TROPONIN I (HIGH SENSITIVITY)
Troponin I (High Sensitivity): 3 ng/L (ref <18)
Troponin I (High Sensitivity): 5 ng/L (ref <18)

## 2020-04-23 MED ORDER — IPRATROPIUM BROMIDE HFA 17 MCG/ACT IN AERS
2.0000 | INHALATION_SPRAY | Freq: Once | RESPIRATORY_TRACT | Status: AC
  Administered 2020-04-23: 2 via RESPIRATORY_TRACT
  Filled 2020-04-23: qty 12.9

## 2020-04-23 MED ORDER — PREDNISONE 20 MG PO TABS: ORAL_TABLET | ORAL | 0 refills | Status: DC

## 2020-04-23 MED ORDER — ALBUTEROL SULFATE HFA 108 (90 BASE) MCG/ACT IN AERS
2.0000 | INHALATION_SPRAY | Freq: Once | RESPIRATORY_TRACT | Status: AC
  Administered 2020-04-23: 2 via RESPIRATORY_TRACT
  Filled 2020-04-23: qty 6.7

## 2020-04-23 MED ORDER — IPRATROPIUM-ALBUTEROL 20-100 MCG/ACT IN AERS: 1.0000 | INHALATION_SPRAY | Freq: Four times a day (QID) | RESPIRATORY_TRACT | 0 refills | Status: DC | PRN

## 2020-04-23 MED ORDER — SODIUM CHLORIDE 0.9% FLUSH: 3.0000 mL | Freq: Once | INTRAVENOUS | Status: DC

## 2020-04-23 NOTE — ED Triage Notes (Signed)
Pt reports she started to experience chest pressure and SOB tonight.  She has had a cough for a few days.

## 2020-04-23 NOTE — Telephone Encounter (Signed)
Spoke with pt, referred her to her medical doctor for patches. She has an appointment with them in July and will probably not be able to get prior to that but she will give them a call. She will let me know if not able to get patches from PCP.

## 2020-04-23 NOTE — ED Provider Notes (Signed)
[facility] EMERGENCY DEPARTMENT Provider Note   CSN: [id number] Arrival date & time: 04/23/20  0215     History Chief Complaint  Patient presents with  . Shortness of Breath  . Chest Pain    Emma Wilson is a 64 y.o. female.  The history is provided by the patient.  Shortness of Breath Severity:  Moderate Onset quality:  Gradual Timing:  Constant Progression:  Unchanged Chronicity:  New Context: not strong odors and not weather changes   Relieved by:  Nothing Worsened by:  Nothing Ineffective treatments:  None tried Associated symptoms: cough and wheezing   Associated symptoms: no abdominal pain, no fever, no hemoptysis and no sputum production   Risk factors: tobacco use   Risk factors: no recent alcohol use and no hx of PE/DVT   Patient with COPD presents with SOB and chest tightness x 2 days with dry cough.  No f/c/r.  No leg pain or swelling.  No exertional symptoms.       Past Medical History:  Diagnosis Date  . Abnormality of gait 03/19/2014   one leg shorter than other"uses cane frequently"  . Carpal tunnel syndrome    right wrist  . Chronic back pain    DDD  . COPD (chronic obstructive pulmonary disease) ([place])   . Depression 2006   "after my son passed" (12/05/2013)  . Fibromyalgia   . GERD (gastroesophageal reflux disease)   . Hematuria    microscopic hematuria (urol & nephrol w/u neg), multiple UTI`s, glomerular from [facility], Dr. Jeffie Pollock 2014  . Hyperlipidemia    managed by Dr. Ouida Sills  . Hypothyroidism   . Leg length inequality    sees rheumatology  . Migraine    "q 3-4 months" (12/05/2013)  . Osteoarthritis    Rheumatoid and OA?  Dr. Titus Mould [facility], rheumatology)  . Pneumonia    "abuntantly as a child"  . Rheumatoid arthritis (HCC)    Dr. Titus Mould  . Shortness of breath    "at rest" (12/05/2013)  . Sinus bradycardia   . Thyroid cancer ([place])    S/P radiation & OR    Patient Active Problem List   Diagnosis Date  Noted  . Asymptomatic microscopic hematuria 06/26/2017  . History of kidney stones 05/24/2017  . History of thyroid cancer 05/24/2017  . Encounter for health maintenance examination in adult 07/26/2016  . Need for prophylactic vaccination and inoculation against influenza 07/26/2016  . History of COPD 07/26/2016  . Hereditary and idiopathic peripheral neuropathy 07/26/2016  . Decreased radial pulse 07/26/2016  . Decreased pulses in feet 07/26/2016  . Abnormality of gait 03/19/2014  . Pure hypercholesterolemia 01/24/2013  . Hematuria, unspecified 01/24/2013  . Fibromyalgia 01/24/2013  . Bradycardia 03/25/2011  . TOBACCO ABUSE 02/12/2010  . Hypothyroidism 02/11/2010  . COPD 02/11/2010  . GERD 02/11/2010    Past Surgical History:  Procedure Laterality Date  . COLONOSCOPY  10/13/2010   external and internal hemorrhoids, repeat 2021; Dr. Clarene Essex  . CYSTOSCOPY  02/26/2013   Dr. Jeffie Pollock (normal; follicular cystitis)  . EUS N/A 03/18/2016   Procedure: UPPER ENDOSCOPIC ULTRASOUND (EUS) RADIAL;  Surgeon: Milus Banister, MD;  Location: [facility] ENDOS[facility];  Service: Endoscopy;  Laterality: N/A;  . THYROIDECTOMY, PARTIAL  11/2005; 11/2006  . TOTAL ABDOMINAL HYSTERECTOMY  ~ 1990   due to bleeding  . TUBAL LIGATION  1980's     OB History   No obstetric history on file.     Family History  Problem Relation Age of Onset  . Stroke Mother   . Coronary artery disease Mother   . Kidney disease Mother        on dialysis  . Heart disease Mother   . Cancer Father        multiple myeloma  . Arthritis Sister        rheumatoid and osteoarthritis  . Fibromyalgia Sister   . Arthritis Sister        RA and OA  . Diabetes Son     Social History   Tobacco Use  . Smoking status: Current Every Day Smoker    Packs/day: 0.50    Years: 35.00    Pack years: 17.50    Types: Cigarettes  . Smokeless tobacco: Never Used  Substance Use Topics  . Alcohol use: Yes    Alcohol/week: 0.0 standard drinks     Comment: once to twice a week   . Drug use: No    Home Medications Prior to Admission medications   Medication Sig Start Date End Date Taking? Authorizing Provider  Ascorbic Acid (VITAMIN C) 1000 MG tablet Take 1,000 mg by mouth daily.   Yes [provider]  BLACK ELDERBERRY PO Take 15 mLs by mouth daily.    Yes [provider]  calcium gluconate 500 MG tablet Take 500 mg by mouth daily.   Yes [provider]  cholecalciferol (VITAMIN D) 1000 UNITS tablet Take 2,000 Units by mouth daily.    Yes [provider]  diclofenac sodium (VOLTAREN) 1 % GEL Apply 1 application topically 3 (three) times daily as needed (For pain.).    Yes [provider]  gabapentin (NEURONTIN) 300 MG capsule Take 300 mg by mouth 3 (three) times daily.  04/24/19  Yes [provider]  OVER THE COUNTER MEDICATION Take 1 each by mouth daily. CBD Oil, CBD tea with tumeric and ginger, and CBD honey   Yes [provider]  OVER THE COUNTER MEDICATION Take 1 each by mouth daily. Pochai herb used for nausea   Yes [provider]  SYNTHROID 137 MCG tablet Take 1 tablet (137 mcg total) by mouth daily before breakfast. 07/27/16  Yes Tysinger, Camelia Eng, PA-C  tiZANidine (ZANAFLEX) 4 MG tablet Take 4 mg by mouth daily.  08/17/18  Yes [provider]  atorvastatin (LIPITOR) 20 MG tablet Take 1 tablet (20 mg total) by mouth daily. 07/27/16   Tysinger, Camelia Eng, PA-C  rosuvastatin (CRESTOR) 20 MG tablet Take 20 mg by mouth daily.    [provider]    Allergies    Patient has no known allergies.  Review of Systems   Review of Systems  Constitutional: Negative for fever.  HENT: Negative for congestion.   Eyes: Negative for visual disturbance.  Respiratory: Positive for cough, chest tightness, shortness of breath and wheezing. Negative for hemoptysis and sputum production.   Cardiovascular: Negative for palpitations and leg swelling.    Gastrointestinal: Negative for abdominal pain.  Genitourinary: Negative for difficulty urinating.  Musculoskeletal: Negative for arthralgias.  Skin: Negative for pallor.  Neurological: Negative for dizziness.  Psychiatric/Behavioral: Negative for agitation.  All other systems reviewed and are negative.   Physical Exam Updated Vital Signs BP (!) 148/92   Pulse (!) 51   Temp 98 F (36.7 C) (Oral)   Resp 20   Ht '5\' 7"'$  (1.702 m)   Wt 63.1 kg   SpO2 96%   BMI 21.79 kg/m   Physical Exam Vitals and  nursing note reviewed.  Constitutional:      General: She is not in acute distress.    Appearance: Normal appearance.  HENT:     Head: Normocephalic and atraumatic.     Nose: Nose normal.  Eyes:     Conjunctiva/sclera: Conjunctivae normal.     Pupils: Pupils are equal, round, and reactive to light.  Cardiovascular:     Rate and Rhythm: Normal rate and regular rhythm.     Pulses: Normal pulses.     Heart sounds: Normal heart sounds.  Pulmonary:     Breath sounds: Wheezing present.  Abdominal:     General: Abdomen is flat. Bowel sounds are normal.     Tenderness: There is no abdominal tenderness. There is no guarding.  Musculoskeletal:        General: Normal range of motion.     Cervical back: Normal range of motion and neck supple.  Skin:    General: Skin is warm and dry.     Capillary Refill: Capillary refill takes less than 2 seconds.     Comments: Clubbing of the nails   Neurological:     General: No focal deficit present.     Mental Status: She is alert and oriented to person, place, and time.     Deep Tendon Reflexes: Reflexes normal.  Psychiatric:        Mood and Affect: Mood normal.        Behavior: Behavior normal.     ED Results / Procedures / Treatments   Labs (all labs ordered are listed, but only abnormal results are displayed) Results for orders placed or performed during the hospital encounter of 32/35/57  Basic metabolic panel  Result Value Ref Range    Sodium 136 135 - 145 mmol/L   Potassium 3.6 3.5 - 5.1 mmol/L   Chloride 105 98 - 111 mmol/L   CO2 23 22 - 32 mmol/L   Glucose, Bld 94 70 - 99 mg/dL   BUN 11 8 - 23 mg/dL   Creatinine, Ser 0.92 0.44 - 1.00 mg/dL   Calcium 8.7 (L) 8.9 - 10.3 mg/dL   GFR calc non Af Amer >60 >60 mL/min   GFR calc Af Amer >60 >60 mL/min   Anion gap 8 5 - 15  CBC  Result Value Ref Range   WBC 4.3 4.0 - 10.5 K/uL   RBC 4.09 3.87 - 5.11 MIL/uL   Hemoglobin 12.0 12.0 - 15.0 g/dL   HCT 34.8 (L) 36.0 - 46.0 %   MCV 85.1 80.0 - 100.0 fL   MCH 29.3 26.0 - 34.0 pg   MCHC 34.5 30.0 - 36.0 g/dL   RDW 14.6 11.5 - 15.5 %   Platelets 239 150 - 400 K/uL   nRBC 0.0 0.0 - 0.2 %  Troponin I (High Sensitivity)  Result Value Ref Range   Troponin I (High Sensitivity) 3 <18 ng/L  Troponin I (High Sensitivity)  Result Value Ref Range   Troponin I (High Sensitivity) 5 <18 ng/L   DG Chest 2 View  Result Date: 04/23/2020 CLINICAL DATA:  Chest pain EXAM: CHEST - 2 VIEW COMPARISON:  None. FINDINGS: The heart size and mediastinal contours are within normal limits. Both lungs are clear. The visualized skeletal structures are unremarkable. IMPRESSION: No active cardiopulmonary disease. Electronically Signed   By: Ulyses Jarred M.D.   On: 04/23/2020 03:02    EKG EKG Interpretation  Date/Time:  Wednesday April 23 2020 02:15:51 EDT Ventricular Rate:  53 PR  Interval:  182 QRS Duration: 68 QT Interval:  410 QTC Calculation: 384 R Axis:   70 Text Interpretation: Sinus bradycardia Nonspecific ST and T wave abnormality old Confirmed by Randal Buba, Ramonte Mena ([id number]) on 04/23/2020 4:49:53 AM   Radiology DG Chest 2 View  Result Date: 04/23/2020 CLINICAL DATA:  Chest pain EXAM: CHEST - 2 VIEW COMPARISON:  None. FINDINGS: The heart size and mediastinal contours are within normal limits. Both lungs are clear. The visualized skeletal structures are unremarkable. IMPRESSION: No active cardiopulmonary disease. Electronically Signed   By:  Ulyses Jarred M.D.   On: 04/23/2020 03:02    Procedures Procedures (including critical care time)  Medications Ordered in ED Medications  sodium chloride flush (NS) 0.9 % injection 3 mL (3 mLs Intravenous Not Given 04/23/20 0459)  albuterol (VENTOLIN HFA) 108 (90 Base) MCG/ACT inhaler 2 puff (2 puffs Inhalation Given 04/23/20 0457)  ipratropium (ATROVENT HFA) inhaler 2 puff (2 puffs Inhalation Given 04/23/20 0506)    ED Course  I have reviewed the triage vital signs and the nursing notes.  Pertinent labs & imaging results that were available during my care of the patient were reviewed by me and considered in my medical decision making (see chart for details).  Symptoms are consistent with COPD exacerbation.  Treated in the ED.  Will start steroids.  Ruled out for MI in the ED.   [name] was evaluated in Emergency Department on 04/23/2020 for the symptoms described in the history of present illness. She was evaluated in the context of the global COVID-19 pandemic, which necessitated consideration that the patient might be at risk for infection with the SARS-CoV-2 virus that causes COVID-19. Institutional protocols and algorithms that pertain to the evaluation of patients at risk for COVID-19 are in a state of rapid change based on information released by regulatory bodies including the CDC and federal and state organizations. These policies and algorithms were followed during the patient's care in the ED.  Final Clinical Impression(s) / ED Diagnoses Return for intractable cough, coughing up blood,fevers >100.4 unrelieved by medication, shortness of breath, intractable vomiting, chest pain, shortness of breath, weakness,numbness, changes in speech, facial asymmetry,abdominal pain, passing out,Inability to tolerate liquids or food, cough, altered mental status or any concerns. No signs of systemic illness or infection. The patient is nontoxic-appearing on exam and vital signs are within  normal limits.   I have reviewed the triage vital signs and the nursing notes. Pertinent labs &imaging results that were available during my care of the patient were reviewed by me and considered in my medical decision making (see chart for details).After history, exam, and medical workup I feel the patient has beenappropriately medically screened and is safe for discharge home. Pertinent diagnoses were discussed with the patient. Patient was given return precautions.    Unknown Schleyer, MD 04/23/20 [id number]

## 2020-04-23 NOTE — Telephone Encounter (Signed)
New Message  Pt c/o medication issue:  1. Name of Medication: Patch to stop smoking   2. How are you currently taking this medication (dosage and times per day)? n/a  3. Are you having a reaction (difficulty breathing--STAT)? n/a  4. What is your medication issue? Patient is trying to quit smoking and is wanting a prescription for the patch to assist with this. Please call to discuss.

## 2020-05-09 ENCOUNTER — Telehealth: Payer: Self-pay | Admitting: Cardiology

## 2020-05-09 MED ORDER — NICOTINE 14 MG/24HR TD PT24: 14.0000 mg | MEDICATED_PATCH | Freq: Every day | TRANSDERMAL | 0 refills | Status: DC

## 2020-05-09 MED ORDER — NICOTINE 21 MG/24HR TD PT24: 21.0000 mg | MEDICATED_PATCH | Freq: Every day | TRANSDERMAL | 0 refills | Status: DC

## 2020-05-09 MED ORDER — NICOTINE 7 MG/24HR TD PT24: 7.0000 mg | MEDICATED_PATCH | Freq: Every day | TRANSDERMAL | 0 refills | Status: DC

## 2020-05-09 NOTE — Telephone Encounter (Signed)
Give pt prescription for nicotine patch Emma Wilson

## 2020-05-09 NOTE — Telephone Encounter (Signed)
Spoke with pt, aware prescription sent to [facility] for the 3 size patches. Patient voiced understanding not to smoke while wearing the patch.

## 2020-05-09 NOTE — Telephone Encounter (Signed)
    Pt c/o medication issue:  1. Name of Medication: stop smoking patch  2. How are you currently taking this medication (dosage and times per day)?   3. Are you having a reaction (difficulty breathing--STAT)?   4. What is your medication issue? Pt said she and Dr. Stanford Breed talk about it during her last visit and she would like to try to help her quit smoking. She is wondering if Dr. Stanford Breed can call a prescription for her

## 2020-11-25 NOTE — Progress Notes (Signed)
Virtual Visit via Telephone Note   This visit type was conducted due to national recommendations for restrictions regarding the COVID-19 Pandemic (e.g. social distancing) in an effort to limit this patient's exposure and mitigate transmission in our community.  Due to her co-morbid illnesses, this patient is at least at moderate risk for complications without adequate follow up.  This format is felt to be most appropriate for this patient at this time.  The patient did not have access to video technology/had technical difficulties with video requiring transitioning to audio format only (telephone).  All issues noted in this document were discussed and addressed.  No physical exam could be performed with this format.  Please refer to the patient's chart for her  consent to telehealth for [facility].  Evaluation Performed:  Follow-up visit  This visit type was conducted due to national recommendations for restrictions regarding the COVID-19 Pandemic (e.g. social distancing).  This format is felt to be most appropriate for this patient at this time.  All issues noted in this document were discussed and addressed.  No physical exam was performed (except for noted visual exam findings with Video Visits).  Please refer to the patient's chart (MyChart message for video visits and phone note for telephone visits) for the patient's consent to telehealth for [place]  Date:  11/26/2020   ID:  [name], DOB 18-Sep-1956, MRN [id number]  Patient Location:  [address] [place] [address]   Provider location:     [address] Office [phone] Fax [phone]   PCP:  Merrilee Seashore, MD  Cardiologist:  Kirk Ruths, MD  Electrophysiologist:  None   Chief Complaint: Follow-up for bradycardia  History of Present Illness:    Emma Wilson is a 65 y.o. female who presents via audio/video conferencing  for a telehealth visit today.  Patient verified DOB and address.  She has a PMH of COPD, GERD, hypothyroidism, thyroid cancer, kidney stones, tobacco abuse, bradycardia, HLD, decreased radial pulse, and fibromyalgia.  Her nuclear stress test 8/11 showed an EF of 53% no ischemia.  Echocardiogram 1/15 showed normal LV function with grade 1 diastolic dysfunction.  An exercise stress test 3/15 was normal.  ABIs 9/17 were normal.  She was seen for an episode of syncope 4/18.  Cardiac event monitor 5/18 showed sinus rhythm with PVCs.  She was last seen by Dr. Stanford Breed on 12/11/2019.  During that time she reported no increased dyspnea.  She denied chest pain and syncope.  She did report occasional brief episodes of nonsustained palpitations.  She is seen virtually today in follow-up and states she feels well.  She continues to work 40+ hours per week at [employer].  She routinely walks greater than 20,000 steps a day.  She does report some occasional chest discomfort.  She describes this as dull type pain that she ignores.  She denies chest discomfort with activity.  She states that she does not add salt to her food and stays well-hydrated.  When asked about dizziness she reports that she has occasional episodes of dizziness that last for 15 to 20 minutes.  She has not taken her blood pressure during this time and does not check her pulse during these episodes either.  I recommended that she keep a blood pressure log and monitor her heart rate if she has further episodes of dizziness.  I have encouraged her to increase the sodium  in her diet and maintain her hydration.  I will have her maintain her physical activity and request her most recent labs from her PCP.  We will have her follow-up in 1 year.  Today she denies chest pain, shortness of breath, lower extremity edema, fatigue, palpitations, melena, hematuria, hemoptysis, diaphoresis, weakness, presyncope, syncope, orthopnea, and PND.    The  patient does not symptoms concerning for COVID-19 infection (fever, chills, cough, or new SHORTNESS OF BREATH).    Prior CV studies:   The following studies were reviewed today:  EKG 04/23/2020 Sinus bradycardia nonspecific ST and T wave abnormality 53 bpm  Echocardiogram 12/06/2013 Study Conclusions   Left ventricle: The cavity size was normal. Wall thickness  was increased in a pattern of mild LVH. Systolic function  was normal. The estimated ejection fraction was in the range  of 60% to 65%. Wall motion was normal; there were no  regional wall motion abnormalities. Doppler parameters are  consistent with abnormal left ventricular relaxation (grade  1 diastolic dysfunction).       Transthoracic  echocardiography. M-mode, complete 2D, spectral Doppler,  and color Doppler. Height: Height: 170.2cm. Height: 67in.  Weight: Weight: 62.3kg. Weight: 137.1lb. Body mass index:  BMI: 21.5kg/m^2. Body surface area:  BSA: 1.87m^2. Blood  pressure:   131/77. Patient status: Inpatient.  Location: [facility].    Cardiac event monitor 03/16/17 Sinus rhythm with occasional PVCs  Past Medical History:  Diagnosis Date  . Abnormality of gait 03/19/2014   one leg shorter than other"uses cane frequently"  . Carpal tunnel syndrome    right wrist  . Chronic back pain    DDD  . COPD (chronic obstructive pulmonary disease) ([place])   . Depression 2006   "after my son passed" (12/05/2013)  . Fibromyalgia   . GERD (gastroesophageal reflux disease)   . Hematuria    microscopic hematuria (urol & nephrol w/u neg), multiple UTI`s, glomerular from [facility], Dr. Jeffie Pollock 2014  . Hyperlipidemia    managed by Dr. Ouida Sills  . Hypothyroidism   . Leg length inequality    sees rheumatology  . Migraine    "q 3-4 months" (12/05/2013)  . Osteoarthritis    Rheumatoid and OA?  Dr. Titus Mould [facility], rheumatology)  . Pneumonia    "abuntantly as a child"  . Rheumatoid arthritis (HCC)    Dr.  Titus Mould  . Shortness of breath    "at rest" (12/05/2013)  . Sinus bradycardia   . Thyroid cancer ([place])    S/P radiation & OR   Past Surgical History:  Procedure Laterality Date  . COLONOSCOPY  10/13/2010   external and internal hemorrhoids, repeat 2021; Dr. Clarene Essex  . CYSTOSCOPY  02/26/2013   Dr. Jeffie Pollock (normal; follicular cystitis)  . EUS N/A 03/18/2016   Procedure: UPPER ENDOSCOPIC ULTRASOUND (EUS) RADIAL;  Surgeon: Milus Banister, MD;  Location: [facility];  Service: Endoscopy;  Laterality: N/A;  . THYROIDECTOMY, PARTIAL  11/2005; 11/2006  . TOTAL ABDOMINAL HYSTERECTOMY  ~ 1990   due to bleeding  . TUBAL LIGATION  1980's     Current Meds  Medication Sig  . Ascorbic Acid (VITAMIN C) 1000 MG tablet Take 1,000 mg by mouth daily.  Marland Kitchen BLACK ELDERBERRY PO Take 15 mLs by mouth daily.   . calcium gluconate 500 MG tablet Take 500 mg by mouth daily.  . diclofenac sodium (VOLTAREN) 1 % GEL Apply 1 application topically 3 (three) times daily as needed (For pain.).   Marland Kitchen gabapentin (NEURONTIN)  300 MG capsule Take 300 mg by mouth 3 (three) times daily.   . nicotine (NICODERM CQ) 21 mg/24hr patch Place 1 patch (21 mg total) onto the skin daily.  Marland Kitchen OVER THE COUNTER MEDICATION Take 1 each by mouth daily. CBD Oil, CBD tea with tumeric and ginger, and CBD honey  . OVER THE COUNTER MEDICATION Take 1 each by mouth daily. Pochai herb used for nausea  . SYNTHROID 137 MCG tablet Take 1 tablet (137 mcg total) by mouth daily before breakfast.  . tiZANidine (ZANAFLEX) 4 MG tablet Take 4 mg by mouth daily.      Allergies:   Patient has no known allergies.   Social History   Tobacco Use  . Smoking status: Current Every Day Smoker    Packs/day: 0.50    Years: 35.00    Pack years: 17.50    Types: Cigarettes  . Smokeless tobacco: Never Used  Substance Use Topics  . Alcohol use: Yes    Alcohol/week: 0.0 standard drinks    Comment: once to twice a week   . Drug use: No     Family Hx: The patient's  family history includes Arthritis in her sister and sister; Cancer in her father; Coronary artery disease in her mother; Diabetes in her son; Fibromyalgia in her sister; Heart disease in her mother; Kidney disease in her mother; Stroke in her mother.  ROS:   Please see the history of present illness.     All other systems reviewed and are negative.   Labs/Other Tests and Data Reviewed:    Recent Labs: 04/23/2020: BUN 11; Creatinine, Ser 0.92; Hemoglobin 12.0; Platelets 239; Potassium 3.6; Sodium 136   Recent Lipid Panel Lab Results  Component Value Date/Time   CHOL 235 (H) 07/26/2016 07:51 AM   TRIG 104 07/26/2016 07:51 AM   HDL 46 07/26/2016 07:51 AM   CHOLHDL 5.1 (H) 07/26/2016 07:51 AM   LDLCALC 168 (H) 07/26/2016 07:51 AM    Wt Readings from Last 3 Encounters:  11/26/20 138 lb (62.6 kg)  04/23/20 139 lb 1.8 oz (63.1 kg)  12/11/19 139 lb 3.2 oz (63.1 kg)     Exam:    Vital Signs:  BP 120/83   Pulse 63   Wt 138 lb (62.6 kg)   BMI 21.61 kg/m    Well nourished, well developed female in no  acute distress.   ASSESSMENT & PLAN:    1. Palpitations- continue to be well controlled.  No recent events with increased heart rate or irregular heartbeats. Avoid triggers caffeine, chocolate, EtOH, dehydration etc. Heart healthy low-sodium diet Increase physical activity as tolerated  Chest pain- no recent chest arm neck or throat pain. No plans for ischemic evaluation  Hyperlipidemia-LDL 168 07/26/2016. Continue atorvastatin Heart healthy low-sodium high-fiber diet Increase physical activity as tolerated Followed by PCP  Tobacco abuse- has stopped smoking. Congratulated on smoking cessation  History of syncope- denies history of recurrence. Continue to monitor  Disposition: Follow-up with Dr. Stanford Breed on the in 1 year.  COVID-19 Education: The signs and symptoms of COVID-19 were discussed with the patient and how to seek care for testing (follow up with PCP or  arrange E-visit).  The importance of social distancing was discussed today.  Patient Risk:   After full review of this patients clinical status, I feel that they are at least moderate risk at this time.  Time:   Today, I have spent 13 minutes with the patient with telehealth technology discussing exercise, diet, hydration,  blood pressure, chest pain.  I spent greater than 20 minutes reviewing the patient's medications, past medical history, and prior cardiac testing.   Medication Adjustments/Labs and Tests Ordered: Current medicines are reviewed at length with the patient today.  Concerns regarding medicines are outlined above.   Tests Ordered: No orders of the defined types were placed in this encounter.  Medication Changes: No orders of the defined types were placed in this encounter.   Disposition:  in 1 year(s)  Signed, Jossie Ng. Blaine Hari NP-C    06/19/2019 11:58 AM    [address] Office [phone] Fax [phone]

## 2020-11-26 ENCOUNTER — Encounter: Payer: Self-pay | Admitting: General Practice

## 2020-11-26 ENCOUNTER — Other Ambulatory Visit: Payer: Self-pay

## 2020-11-26 ENCOUNTER — Telehealth: Payer: 59 | Admitting: Medical

## 2020-11-26 ENCOUNTER — Telehealth (INDEPENDENT_AMBULATORY_CARE_PROVIDER_SITE_OTHER): Payer: 59 | Admitting: General Practice

## 2020-11-26 VITALS — BP 120/83 | HR 63 | Wt 138.0 lb

## 2020-11-26 DIAGNOSIS — R002 Palpitations: Secondary | ICD-10-CM | POA: Diagnosis not present

## 2020-11-26 DIAGNOSIS — R55 Syncope and collapse: Secondary | ICD-10-CM

## 2020-11-26 DIAGNOSIS — E7800 Pure hypercholesterolemia, unspecified: Secondary | ICD-10-CM | POA: Diagnosis not present

## 2020-11-26 DIAGNOSIS — R079 Chest pain, unspecified: Secondary | ICD-10-CM

## 2020-11-26 DIAGNOSIS — F17200 Nicotine dependence, unspecified, uncomplicated: Secondary | ICD-10-CM | POA: Diagnosis not present

## 2020-11-26 NOTE — Patient Instructions (Signed)
Medication Instructions:  The current medical regimen is effective;  continue present plan and medications as directed. Please refer to the Current Medication list given to you today.  *If you need a refill on your cardiac medications before your next appointment, please call your pharmacy*  Lab Work:   Testing/Procedures:  NONE    NONE  Special Instructions CONTINUE HEART HEALTHY DIET   INCREASE HYDRATION  PLEASE MAINTAIN PHYSICAL ACTIVITY AS TOLERATED  MONITOR AND LOG BLOOD PRESSURE WHEN DIZZY  Follow-Up: Your next appointment:  65 MONTHS In Person with You may see Kirk Ruths, MD OR IF UNAVAILABLE JESSE CLEAVER, FNP-C or one of the following Advanced Practice Providers on your designated Care Team:  Kerin Ransom, PA-C  Sande Rives, [place]  Please call our office 2 months in advance(NOVEMBER) to schedule this (JAN 2023) appointment   At [facility], you and your health needs are our priority.  As part of our continuing mission to provide you with exceptional heart care, we have created designated Provider Care Teams.  These Care Teams include your primary Cardiologist (physician) and Advanced Practice Providers (APPs -  Physician Assistants and Nurse Practitioners) who all work together to provide you with the care you need, when you need it.  We recommend signing up for the patient portal called "MyChart".  Sign up information is provided on this After Visit Summary.  MyChart is used to connect with patients for Virtual Visits (Telemedicine).  Patients are able to view lab/test results, encounter notes, upcoming appointments, etc.  Non-urgent messages can be sent to your provider as well.   To learn more about what you can do with MyChart, go to [email or web address].

## 2020-12-24 ENCOUNTER — Telehealth: Payer: Self-pay | Admitting: Cardiology

## 2020-12-24 NOTE — Telephone Encounter (Signed)
Spoke with patient who is complaining of SOB with exertion. Patient states she has been experiencing increased SOB for the past few days. She denies additional sodium in her diet. Reports drinking two cups of coffee per day. Patient denies any CP, swelling or weigh gain at this time. She denies SOB at rest.   She would like to be seen in office by Dr. Stanford Breed of [facility] this week if possible. Patient aware to call the office back with any new or worsening concerns.

## 2020-12-24 NOTE — Telephone Encounter (Signed)
Pt c/o Shortness Of Breath: STAT if SOB developed within the last 24 hours or pt is noticeably SOB on the phone  1. Are you currently SOB (can you hear that pt is SOB on the phone)? No.  2. How long have you been experiencing SOB? For the past couple of days.  3. Are you SOB when sitting or when up moving around? Moving around.  4. Are you currently experiencing any other symptoms? Patient states that she's been SOB and extremely tired. She states that she would like to see Dr. Stanford Breed about this matter within the next few days. Please advise.

## 2020-12-26 ENCOUNTER — Ambulatory Visit (INDEPENDENT_AMBULATORY_CARE_PROVIDER_SITE_OTHER): Payer: 59 | Admitting: Cardiology

## 2020-12-26 ENCOUNTER — Other Ambulatory Visit: Payer: Self-pay

## 2020-12-26 ENCOUNTER — Encounter: Payer: Self-pay | Admitting: Cardiology

## 2020-12-26 VITALS — BP 118/78 | HR 57 | Ht 67.0 in | Wt 132.0 lb

## 2020-12-26 DIAGNOSIS — R079 Chest pain, unspecified: Secondary | ICD-10-CM | POA: Diagnosis not present

## 2020-12-26 DIAGNOSIS — F17200 Nicotine dependence, unspecified, uncomplicated: Secondary | ICD-10-CM

## 2020-12-26 DIAGNOSIS — R002 Palpitations: Secondary | ICD-10-CM | POA: Diagnosis not present

## 2020-12-26 NOTE — Patient Instructions (Signed)

## 2020-12-26 NOTE — Progress Notes (Signed)
HPI: FU CP.Myoview August of 2011 revealed EF of 53 and no ischemia. Echocardiogram January 2015 showed normal LV function and grade 1 diastolic dysfunction. Exercise treadmill March 2015 normal.ABIs 9/17 normal.Seen for a syncopal episode April 2018. Event monitor 5/18 showed sinus with PVC. Since last seen,she has some dyspnea on exertion.  No orthopnea, PND, pedal edema, exertional chest pain or syncope.  Current Outpatient Medications  Medication Sig Dispense Refill  . Ascorbic Acid (VITAMIN C) 1000 MG tablet Take 1,000 mg by mouth daily.    Marland Kitchen BLACK ELDERBERRY PO Take 15 mLs by mouth daily.     . calcium gluconate 500 MG tablet Take 500 mg by mouth daily.    . diclofenac sodium (VOLTAREN) 1 % GEL Apply 1 application topically 3 (three) times daily as needed (For pain.).     Marland Kitchen gabapentin (NEURONTIN) 300 MG capsule Take 300 mg by mouth 3 (three) times daily.     . nicotine (NICODERM CQ) 14 mg/24hr patch Place 1 patch (14 mg total) onto the skin daily. 28 patch 0  . nicotine (NICODERM CQ) 21 mg/24hr patch Place 1 patch (21 mg total) onto the skin daily. 28 patch 0  . nicotine (NICODERM CQ) 7 mg/24hr patch Place 1 patch (7 mg total) onto the skin daily. 28 patch 0  . OVER THE COUNTER MEDICATION Take 1 each by mouth daily. CBD Oil, CBD tea with tumeric and ginger, and CBD honey    . OVER THE COUNTER MEDICATION Take 1 each by mouth daily. Pochai herb used for nausea    . SYNTHROID 137 MCG tablet Take 1 tablet (137 mcg total) by mouth daily before breakfast. 90 tablet 1  . tiZANidine (ZANAFLEX) 4 MG tablet Take 4 mg by mouth daily.   2   No current facility-administered medications for this visit.     Past Medical History:  Diagnosis Date  . Abnormality of gait 03/19/2014   one leg shorter than other"uses cane frequently"  . Carpal tunnel syndrome    right wrist  . Chronic back pain    DDD  . COPD (chronic obstructive pulmonary disease) ([place])   . Depression 2006   "after my  son passed" (12/05/2013)  . Fibromyalgia   . GERD (gastroesophageal reflux disease)   . Hematuria    microscopic hematuria (urol & nephrol w/u neg), multiple UTI`s, glomerular from [facility], Dr. Jeffie Pollock 2014  . Hyperlipidemia    managed by Dr. Ouida Sills  . Hypothyroidism   . Leg length inequality    sees rheumatology  . Migraine    "q 3-4 months" (12/05/2013)  . Osteoarthritis    Rheumatoid and OA?  Dr. Titus Mould [facility] medical, rheumatology)  . Pneumonia    "abuntantly as a child"  . Rheumatoid arthritis (HCC)    Dr. Titus Mould  . Shortness of breath    "at rest" (12/05/2013)  . Sinus bradycardia   . Thyroid cancer ([place])    S/P radiation & OR    Past Surgical History:  Procedure Laterality Date  . COLONOSCOPY  10/13/2010   external and internal hemorrhoids, repeat 2021; Dr. Clarene Essex  . CYSTOSCOPY  02/26/2013   Dr. Jeffie Pollock (normal; follicular cystitis)  . EUS N/A 03/18/2016   Procedure: UPPER ENDOSCOPIC ULTRASOUND (EUS) RADIAL;  Surgeon: Milus Banister, MD;  Location: [facility];  Service: Endoscopy;  Laterality: N/A;  . THYROIDECTOMY, PARTIAL  11/2005; 11/2006  . TOTAL ABDOMINAL HYSTERECTOMY  ~ 1990   due to bleeding  .  TUBAL LIGATION  1980's    Social History   Socioeconomic History  . Marital status: Divorced    Spouse name: Not on file  . Number of children: 3  . Years of education: college  . Highest education level: Not on file  Occupational History  . Occupation: Armed forces operational officer: [employer]  Tobacco Use  . Smoking status: Current Every Day Smoker    Packs/day: 0.50    Years: 35.00    Pack years: 17.50    Types: Cigarettes  . Smokeless tobacco: Never Used  Substance and Sexual Activity  . Alcohol use: Yes    Alcohol/week: 0.0 standard drinks    Comment: once to twice a week   . Drug use: No  . Sexual activity: Not Currently    Partners: Male  Other Topics Concern  . Not on file  Social History Narrative   Lives alone.  2 living children (1 deceased), 1 in  [place], 1 in [place], 6 grandchildren.  Works at [employer].   Exercise some.  As of 07/2016   Social Determinants of Health   Financial Resource Strain: Not on file  Food Insecurity: Not on file  Transportation Needs: Not on file  Physical Activity: Not on file  Stress: Not on file  Social Connections: Not on file  Intimate Partner Violence: Not on file    Family History  Problem Relation Age of Onset  . Stroke Mother   . Coronary artery disease Mother   . Kidney disease Mother        on dialysis  . Heart disease Mother   . Cancer Father        multiple myeloma  . Arthritis Sister        rheumatoid and osteoarthritis  . Fibromyalgia Sister   . Arthritis Sister        RA and OA  . Diabetes Son     ROS: no fevers or chills, productive cough, hemoptysis, dysphasia, odynophagia, melena, hematochezia, dysuria, hematuria, rash, seizure activity, orthopnea, PND, pedal edema, claudication. Remaining systems are negative.  Physical Exam: Well-developed well-nourished in no acute distress.  Skin is warm and dry.  HEENT is normal.  Neck is supple.  Chest is clear to auscultation with normal expansion.  Cardiovascular exam is regular rate and rhythm.  Abdominal exam nontender or distended. No masses palpated. Extremities show no edema. neuro grossly intact  ECG-sinus bradycardia, nonspecific ST changes.  Personally reviewed  A/P : 1 chest pain-patient denies recent symptoms.  We will continue to follow.  2 palpitations-we will consider addition of beta-blocker in the future if needed.    3 hyperlipidemia-continue statin.  Lipids and liver are followed by primary care.  4 history of syncope-patient has had no recurrences.  5 tobacco abuse-patient counseled on discontinuing.  6 dyspnea-not volume overloaded on examination.  Patient does not appear to be significantly distressed.  We will plan further evaluation if her symptoms worsen.  Kirk Ruths, MD

## 2021-01-26 ENCOUNTER — Ambulatory Visit: Payer: Self-pay

## 2021-01-26 ENCOUNTER — Other Ambulatory Visit: Payer: Self-pay

## 2021-01-26 ENCOUNTER — Ambulatory Visit
Admission: EM | Admit: 2021-01-26 | Discharge: 2021-01-26 | Disposition: A | Discharge status: Home / Self Care | Payer: 59 | Attending: Family Medicine | Admitting: Family Medicine

## 2021-01-26 DIAGNOSIS — R350 Frequency of micturition: Secondary | ICD-10-CM

## 2021-01-26 LAB — POCT URINALYSIS DIP (MANUAL ENTRY)
Bilirubin, UA: NEGATIVE
Glucose, UA: NEGATIVE mg/dL
Ketones, POC UA: NEGATIVE mg/dL
Nitrite, UA: POSITIVE — AB
Protein Ur, POC: NEGATIVE mg/dL
Spec Grav, UA: 1.025 (ref 1.010–1.025)
Urobilinogen, UA: 0.2 E.U./dL
pH, UA: 7 (ref 5.0–8.0)

## 2021-01-26 MED ORDER — SULFAMETHOXAZOLE-TRIMETHOPRIM 800-160 MG PO TABS: 1.0000 | ORAL_TABLET | Freq: Two times a day (BID) | ORAL | 0 refills | Status: AC

## 2021-01-26 NOTE — Discharge Instructions (Addendum)
Treating you for a urinary tract infection Take the antibiotics as prescribed Increase water.  Follow up as needed for continued or worsening symptoms

## 2021-01-26 NOTE — ED Triage Notes (Addendum)
Pt c/o pressure with urination, frequency, urgency for approx 3 days. Nausea and right flank pain yesterday.   Report h/o micro hematuria.  Denies fever, v/d.  No OTC meds for symptoms Pt attributes recurrent UTI symptoms to "possibly being from the neurontin".

## 2021-01-26 NOTE — ED Provider Notes (Signed)
[facility]    CSN: [id number] Arrival date & time: 01/26/21  0850      History   Chief Complaint Chief Complaint  Patient presents with  . Dysuria    HPI SRIHITHA TAGLIAFERRI is a 65 y.o. female.   Patient is a 65 year old female that presents today with pressure with urination, frequency, urgency for approx 3 days. Nausea and right flank pain yesterday.  Denies fever, v/d. No OTC meds for symptoms. Tried cranberry juice.    Dysuria   Past Medical History:  Diagnosis Date  . Abnormality of gait 03/19/2014   one leg shorter than other"uses cane frequently"  . Carpal tunnel syndrome    right wrist  . Chronic back pain    DDD  . COPD (chronic obstructive pulmonary disease) ([place])   . Depression 2006   "after my son passed" (12/05/2013)  . Fibromyalgia   . GERD (gastroesophageal reflux disease)   . Hematuria    microscopic hematuria (urol & nephrol w/u neg), multiple UTI`s, glomerular from [facility], Dr. Jeffie Pollock 2014  . Hyperlipidemia    managed by Dr. Ouida Sills  . Hypothyroidism   . Leg length inequality    sees rheumatology  . Migraine    "q 3-4 months" (12/05/2013)  . Osteoarthritis    Rheumatoid and OA?  Dr. Titus Mould [facility] medical, rheumatology)  . Pneumonia    "abuntantly as a child"  . Rheumatoid arthritis (HCC)    Dr. Titus Mould  . Shortness of breath    "at rest" (12/05/2013)  . Sinus bradycardia   . Thyroid cancer ([place])    S/P radiation & OR    Patient Active Problem List   Diagnosis Date Noted  . Asymptomatic microscopic hematuria 06/26/2017  . History of kidney stones 05/24/2017  . History of thyroid cancer 05/24/2017  . Encounter for health maintenance examination in adult 07/26/2016  . Need for prophylactic vaccination and inoculation against influenza 07/26/2016  . History of COPD 07/26/2016  . Hereditary and idiopathic peripheral neuropathy 07/26/2016  . Decreased radial pulse 07/26/2016  . Decreased pulses in feet 07/26/2016  . Abnormality of  gait 03/19/2014  . Pure hypercholesterolemia 01/24/2013  . Hematuria, unspecified 01/24/2013  . Fibromyalgia 01/24/2013  . Bradycardia 03/25/2011  . TOBACCO ABUSE 02/12/2010  . Hypothyroidism 02/11/2010  . COPD 02/11/2010  . GERD 02/11/2010    Past Surgical History:  Procedure Laterality Date  . COLONOSCOPY  10/13/2010   external and internal hemorrhoids, repeat 2021; Dr. Clarene Essex  . CYSTOSCOPY  02/26/2013   Dr. Jeffie Pollock (normal; follicular cystitis)  . EUS N/A 03/18/2016   Procedure: UPPER ENDOSCOPIC ULTRASOUND (EUS) RADIAL;  Surgeon: Milus Banister, MD;  Location: [facility];  Service: Endoscopy;  Laterality: N/A;  . THYROIDECTOMY, PARTIAL  11/2005; 11/2006  . TOTAL ABDOMINAL HYSTERECTOMY  ~ 1990   due to bleeding  . TUBAL LIGATION  1980's    OB History   No obstetric history on file.      Home Medications    Prior to Admission medications   Medication Sig Start Date End Date Taking? Authorizing Provider  Ascorbic Acid (VITAMIN C) 1000 MG tablet Take 1,000 mg by mouth daily.   Yes [provider]  BLACK ELDERBERRY PO Take 15 mLs by mouth daily.    Yes [provider]  calcium gluconate 500 MG tablet Take 500 mg by mouth daily.   Yes [provider]  gabapentin (NEURONTIN) 300 MG capsule Take 300 mg by mouth 3 (three)  times daily.  04/24/19  Yes [provider]  sulfamethoxazole-trimethoprim (BACTRIM DS) 800-160 MG tablet Take 1 tablet by mouth 2 (two) times daily for 7 days. 01/26/21 02/02/21 Yes Lemya Greenwell, Linus Galas, NP  SYNTHROID 137 MCG tablet Take 1 tablet (137 mcg total) by mouth daily before breakfast. 07/27/16  Yes Tysinger, Camelia Eng, PA-C  tiZANidine (ZANAFLEX) 4 MG tablet Take 4 mg by mouth daily.  08/17/18  Yes [provider]  diclofenac sodium (VOLTAREN) 1 % GEL Apply 1 application topically 3 (three) times daily as needed (For pain.).     [provider]  nicotine (NICODERM CQ) 14 mg/24hr patch Place 1 patch (14 mg total)  onto the skin daily. 05/09/20   Lelon Perla, MD  nicotine (NICODERM CQ) 21 mg/24hr patch Place 1 patch (21 mg total) onto the skin daily. 05/09/20   Lelon Perla, MD  nicotine (NICODERM CQ) 7 mg/24hr patch Place 1 patch (7 mg total) onto the skin daily. 05/09/20   Lelon Perla, MD  OVER THE COUNTER MEDICATION Take 1 each by mouth daily. CBD Oil, CBD tea with tumeric and ginger, and CBD honey    [provider]  OVER THE COUNTER MEDICATION Take 1 each by mouth daily. Pochai herb used for nausea    [provider]    Family History Family History  Problem Relation Age of Onset  . Stroke Mother   . Coronary artery disease Mother   . Kidney disease Mother        on dialysis  . Heart disease Mother   . Cancer Father        multiple myeloma  . Arthritis Sister        rheumatoid and osteoarthritis  . Fibromyalgia Sister   . Arthritis Sister        RA and OA  . Diabetes Son     Social History Social History   Tobacco Use  . Smoking status: Current Every Day Smoker    Packs/day: 0.50    Years: 35.00    Pack years: 17.50    Types: Cigarettes  . Smokeless tobacco: Never Used  Substance Use Topics  . Alcohol use: Yes    Alcohol/week: 0.0 standard drinks    Comment: once to twice a week   . Drug use: No     Allergies   Patient has no known allergies.   Review of Systems Review of Systems  Genitourinary: Positive for dysuria.     Physical Exam Triage Vital Signs ED Triage Vitals  Enc Vitals Group     BP 01/26/21 0914 (!) 152/90     Pulse Rate 01/26/21 0914 (!) 50     Resp 01/26/21 0914 18     Temp 01/26/21 0914 98.1 F (36.7 C)     Temp Source 01/26/21 0914 Oral     SpO2 01/26/21 0914 98 %     Weight --      Height --      Head Circumference --      Peak Flow --      Pain Score 01/26/21 0904 4     Pain Loc --      Pain Edu? --      Excl. in [place]? --    No data found.  Updated Vital Signs BP (!) 152/90 (BP Location: Left Arm)    Pulse (!) 50   Temp 98.1 F (36.7 C) (Oral)   Resp 18   SpO2 98%  Visual Acuity Right Eye Distance:   Left Eye Distance:   Bilateral Distance:    Right Eye Near:   Left Eye Near:    Bilateral Near:     Physical Exam Vitals and nursing note reviewed.  Constitutional:      General: She is not in acute distress.    Appearance: Normal appearance. She is not ill-appearing, toxic-appearing or diaphoretic.  HENT:     Head: Normocephalic.  Eyes:     Conjunctiva/sclera: Conjunctivae normal.  Pulmonary:     Effort: Pulmonary effort is normal.  Abdominal:     Tenderness: There is no right CVA tenderness or left CVA tenderness.  Musculoskeletal:        General: Normal range of motion.     Cervical back: Normal range of motion.  Skin:    General: Skin is warm and dry.     Findings: No rash.  Neurological:     Mental Status: She is alert.  Psychiatric:        Mood and Affect: Mood normal.      UC Treatments / Results  Labs (all labs ordered are listed, but only abnormal results are displayed) Labs Reviewed  POCT URINALYSIS DIP (MANUAL ENTRY) - Abnormal; Notable for the following components:      Result Value   Clarity, UA cloudy (*)    Blood, UA moderate (*)    Nitrite, UA Positive (*)    Leukocytes, UA Large (3+) (*)    All other components within normal limits  URINE CULTURE    EKG   Radiology No results found.  Procedures Procedures (including critical care time)  Medications Ordered in UC Medications - No data to display  Initial Impression / Assessment and Plan / UC Course  I have reviewed the triage vital signs and the nursing notes.  Pertinent labs & imaging results that were available during my care of the patient were reviewed by me and considered in my medical decision making (see chart for details).     UTI Treated with Bactrim. culture pending.  Antibiotics as prescribed.  Recommend push fluids.  Follow up as needed for continued or  worsening symptoms  Final Clinical Impressions(s) / UC Diagnoses   Final diagnoses:  Urinary frequency     Discharge Instructions     Treating you for a urinary tract infection Take the antibiotics as prescribed Increase water.  Follow up as needed for continued or worsening symptoms     ED Prescriptions    Medication Sig Dispense Auth. Provider   sulfamethoxazole-trimethoprim (BACTRIM DS) 800-160 MG tablet Take 1 tablet by mouth 2 (two) times daily for 7 days. 14 tablet Shina Wass A, NP     PDMP not reviewed this encounter.   Orvan July, NP 01/26/21 [phone]

## 2021-01-28 ENCOUNTER — Telehealth (HOSPITAL_COMMUNITY): Payer: Self-pay | Admitting: Emergency Medicine

## 2021-01-28 LAB — URINE CULTURE: Culture: >=100000 — AB

## 2021-01-28 MED ORDER — NITROFURANTOIN MONOHYD MACRO 100 MG PO CAPS: 100.0000 mg | ORAL_CAPSULE | Freq: Two times a day (BID) | ORAL | 0 refills | Status: DC

## 2021-05-27 ENCOUNTER — Other Ambulatory Visit: Payer: Self-pay | Admitting: Physician Assistant

## 2021-05-27 ENCOUNTER — Ambulatory Visit
Admission: RE | Admit: 2021-05-27 | Discharge: 2021-05-27 | Disposition: A | Discharge status: Home / Self Care | Payer: 59 | Source: Ambulatory Visit | Attending: Physician Assistant | Admitting: Physician Assistant

## 2021-05-27 DIAGNOSIS — M545 Low back pain, unspecified: Secondary | ICD-10-CM

## 2021-05-27 DIAGNOSIS — G8929 Other chronic pain: Secondary | ICD-10-CM

## 2021-07-16 ENCOUNTER — Other Ambulatory Visit: Payer: Self-pay | Admitting: Physician Assistant

## 2021-07-16 DIAGNOSIS — M48061 Spinal stenosis, lumbar region without neurogenic claudication: Secondary | ICD-10-CM

## 2021-07-31 ENCOUNTER — Other Ambulatory Visit: Payer: Self-pay

## 2021-07-31 ENCOUNTER — Ambulatory Visit
Admission: RE | Admit: 2021-07-31 | Discharge: 2021-07-31 | Disposition: A | Discharge status: Home / Self Care | Payer: 59 | Source: Ambulatory Visit | Attending: Physician Assistant | Admitting: Physician Assistant

## 2021-07-31 DIAGNOSIS — M48061 Spinal stenosis, lumbar region without neurogenic claudication: Secondary | ICD-10-CM

## 2021-08-03 ENCOUNTER — Telehealth: Payer: Self-pay | Admitting: Cardiology

## 2021-08-03 ENCOUNTER — Ambulatory Visit (INDEPENDENT_AMBULATORY_CARE_PROVIDER_SITE_OTHER): Payer: 59 | Admitting: Internal Medicine

## 2021-08-03 ENCOUNTER — Other Ambulatory Visit: Payer: Self-pay

## 2021-08-03 ENCOUNTER — Encounter: Payer: Self-pay | Admitting: Internal Medicine

## 2021-08-03 VITALS — BP 134/87 | HR 56 | Ht 67.0 in | Wt 128.4 lb

## 2021-08-03 DIAGNOSIS — E7800 Pure hypercholesterolemia, unspecified: Secondary | ICD-10-CM

## 2021-08-03 DIAGNOSIS — I200 Unstable angina: Secondary | ICD-10-CM

## 2021-08-03 DIAGNOSIS — F17200 Nicotine dependence, unspecified, uncomplicated: Secondary | ICD-10-CM

## 2021-08-03 NOTE — Telephone Encounter (Signed)
Spoke to patient . She states the has pain off and on since Friday .    She would rate scale as a 4 out of 10. No shortness of breath, no diaphoresis ,no nausea , no radiation . Patient states she does have dx of fibromyalgia    Patient is not having discomfort at  present , aware  she will need to go ER for any symptoms.  Appointment  schedule for  08/03/21 at 11:30 am. Patient verbalized understanding.

## 2021-08-03 NOTE — Telephone Encounter (Signed)
Pt c/o of Chest Pain: STAT if CP now or developed within 24 hours  1. Are you having CP right now?  Yes, pain states she is experiencing mild discomfort right now.   2. Are you experiencing any other symptoms (ex. SOB, nausea, vomiting, sweating)?  No   3. How long have you been experiencing CP?  Since 07/31/21. She states she initially assumes it was heart burn, but it never fully went away. She states has been taking Aspirin 81 MG because she doesn't want to go to urgent care.   4. Is your CP continuous or coming and going?  Coming and going  5. Have you taken Nitroglycerin?  No  ?

## 2021-08-03 NOTE — H&P (View-Only) (Signed)
Cardiology Office Note:    Date:  08/03/2021   ID:  Emma Wilson, DOB [birth year], MRN [id number]  PCP:  Merrilee Seashore, MD  Cardiologist:  Kirk Ruths, MD  Electrophysiologist:  None   Referring MD: Merrilee Seashore, MD   Chief Complaint/Reason for Referral: Acute care visit: Chest pain  History of Present Illness:    Emma Wilson is a 65 y.o. female with a history of active smoking, hyperlipidemia history of COPD, hypothyroidism.  Presents for an acute care visit after calling into the nursing triage line this morning with chest pain for the last 4 days.  Thought it was heartburn.  Drank a Coke to see if it would help her belch and feel better, this did not help. Got hot on Friday afternoon going home from work, pain in chest - felt like indigestion. Stopped to get something to eat.  She ate at [facility] but still did not feel better.  She works for [employer] and is on her feet all day. Worked on Friday, works at [employer]. Pressure on chest. Lower substernal pain. Hours of pain, on Friday she went to sleep after taking tizanidine and pain went away. Chest pain recurred on Saturday morning, hours of pain.  She was organizing a birthday party and forgot about her chest pain that day after getting busy.  Sunday chest pain recurred, comes and goes, but was severe enough that she thought about going to the ER, 9 out of 10. Thought about going to ER on Friday and Sunday. Friday pain 9/10, Saturday 4/10.   Smoker.   Mother had MI.   Been taking ASA 81 mg daily given concerns about cardiovascular health over the last several days.  Does not have a known history of hypertension.  Does have hyperlipidemia, not currently on therapy.  LDL 126.  The patient denies dyspnea at rest or with exertion, palpitations, PND, orthopnea, or leg swelling. Denies cough, fever, chills. Denies nausea, vomiting. Denies syncope or presyncope. Denies dizziness or lightheadedness.  She  is experiencing fatigue with her chest pain.   Past Medical History:  Diagnosis Date   Abnormality of gait 03/19/2014   one leg shorter than other"uses cane frequently"   Carpal tunnel syndrome    right wrist   Chronic back pain    DDD   COPD (chronic obstructive pulmonary disease) ([place])    Depression 2006   "after my son passed" (12/05/2013)   Fibromyalgia    GERD (gastroesophageal reflux disease)    Hematuria    microscopic hematuria (urol & nephrol w/u neg), multiple UTI`s, glomerular from [facility], Dr. Jeffie Pollock 2014   Hyperlipidemia    managed by Dr. Ouida Sills   Hypothyroidism    Leg length inequality    sees rheumatology   Migraine    "q 3-4 months" (12/05/2013)   Osteoarthritis    Rheumatoid and OA?  Dr. Titus Mould ([facility], rheumatology)   Pneumonia    "abuntantly as a child"   Rheumatoid arthritis ([place])    Dr. Titus Mould   Shortness of breath    "at rest" (12/05/2013)   Sinus bradycardia    Thyroid cancer ([place])    S/P radiation & OR    Past Surgical History:  Procedure Laterality Date   COLONOSCOPY  10/13/2010   external and internal hemorrhoids, repeat 2021; Dr. Clarene Essex   CYSTOSCOPY  02/26/2013   Dr. Jeffie Pollock (normal; follicular cystitis)   EUS N/A 03/18/2016   Procedure: UPPER ENDOSCOPIC ULTRASOUND (EUS) RADIAL;  Surgeon: Milus Banister, MD;  Location: [facility];  Service: Endoscopy;  Laterality: N/A;   THYROIDECTOMY, PARTIAL  11/2005; 11/2006   TOTAL ABDOMINAL HYSTERECTOMY  ~ 1990   due to bleeding   TUBAL LIGATION  1980's    Current Medications: Current Meds  Medication Sig   Ascorbic Acid (VITAMIN C) 1000 MG tablet Take 1,000 mg by mouth daily.   BLACK ELDERBERRY PO Take 15 mLs by mouth daily.    calcium gluconate 500 MG tablet Take 500 mg by mouth daily.   diclofenac sodium (VOLTAREN) 1 % GEL Apply 1 application topically 3 (three) times daily as needed (For pain.).    gabapentin (NEURONTIN) 300 MG capsule Take 300 mg by mouth 3 (three) times daily.     nicotine (NICODERM CQ) 14 mg/24hr patch Place 1 patch (14 mg total) onto the skin daily.   nicotine (NICODERM CQ) 21 mg/24hr patch Place 1 patch (21 mg total) onto the skin daily.   nicotine (NICODERM CQ) 7 mg/24hr patch Place 1 patch (7 mg total) onto the skin daily.   nitrofurantoin, macrocrystal-monohydrate, (MACROBID) 100 MG capsule Take 1 capsule (100 mg total) by mouth 2 (two) times daily.   OVER THE COUNTER MEDICATION Take 1 each by mouth daily. CBD Oil, CBD tea with tumeric and ginger, and CBD honey   OVER THE COUNTER MEDICATION Take 1 each by mouth daily. Pochai herb used for nausea   pregabalin (LYRICA) 50 MG capsule 1 capsule   SYNTHROID 137 MCG tablet Take 1 tablet (137 mcg total) by mouth daily before breakfast.   tiZANidine (ZANAFLEX) 4 MG tablet Take 4 mg by mouth daily.    traMADol (ULTRAM) 50 MG tablet Take 50 mg by mouth 3 (three) times daily as needed.     Allergies:   Patient has no known allergies.   Social History   Tobacco Use   Smoking status: Every Day    Packs/day: 0.50    Years: 35.00    Pack years: 17.50    Types: Cigarettes   Smokeless tobacco: Never  Substance Use Topics   Alcohol use: Yes    Alcohol/week: 0.0 standard drinks    Comment: once to twice a week    Drug use: No     Family History: The patient's family history includes Arthritis in her sister and sister; Cancer in her father; Coronary artery disease in her mother; Diabetes in her son; Fibromyalgia in her sister; Heart disease in her mother; Kidney disease in her mother; Stroke in her mother.  ROS:   Please see the history of present illness.    All other systems reviewed and are negative.  EKGs/Labs/Other Studies Reviewed:    The following studies were reviewed today:  EKG:  NSR, minimal LVH criteria, no ST segment deviation.  Imaging studies that I have independently reviewed today: CT abdomen pelvis 03/12/2014, no obvious coronary artery calcifications are seen.  Recent Labs: No  results found for requested labs within last 8760 hours.  Recent Lipid Panel    Component Value Date/Time   CHOL 235 (H) 07/26/2016 0751   TRIG 104 07/26/2016 0751   HDL 46 07/26/2016 0751   CHOLHDL 5.1 (H) 07/26/2016 0751   VLDL 21 07/26/2016 0751   LDLCALC 168 (H) 07/26/2016 0751    Physical Exam:    VS:  BP 134/87   Pulse (!) 56   Ht '5\' 7"'$  (1.702 m)   Wt 128 lb 6.4 oz (58.2 kg)   BMI 20.11 kg/m  Wt Readings from Last 5 Encounters:  08/03/21 128 lb 6.4 oz (58.2 kg)  12/26/20 132 lb (59.9 kg)  11/26/20 138 lb (62.6 kg)  04/23/20 139 lb 1.8 oz (63.1 kg)  12/11/19 139 lb 3.2 oz (63.1 kg)    Constitutional: No acute distress Eyes: sclera non-icteric, normal conjunctiva and lids ENMT: normal dentition, moist mucous membranes Cardiovascular: regular rhythm, normal rate, no murmurs. S1 and S2 normal. Radial pulses normal bilaterally. No jugular venous distention.  Respiratory: clear to auscultation bilaterally GI : normal bowel sounds, soft and nontender. No distention.   MSK: extremities warm, well perfused. No edema.  NEURO: grossly nonfocal exam, moves all extremities. PSYCH: alert and oriented x 3, normal mood and affect.   ASSESSMENT:    1. Unstable angina pectoris ([place])   2. Pure hypercholesterolemia   3. TOBACCO ABUSE    PLAN:    Symptoms are concerning for unstable angina starting on Friday with occasional 9 out of 10 substernal chest pressure.  She has not tried nitroglycerin.  I offered her a nitroglycerin in the office today with observation.  I have also recommended that she present to her nearest emergency department.  After discussing her options she would prefer to drive herself to our ER facility close to her work on [address], and does not feel comfortable taking a nitroglycerin prior to driving.  I recommended EMS transport her but she defers and will take her own private vehicle.  I have cautioned her on my concerns.  EKG shows no ST segment  deviation.  Recommend serial troponins in the ER, consider D-dimer for risk for PE.  She may be a good candidate for coronary [facility] at [facility], and I have recommended that she present to [facility] primarily, however she is very concerned about the COVID-19 exposure as well as wait times.  Close follow-up will be arranged to follow-up the results of this encounter.   Cherlynn Kaiser, MD, [place]   Shared Decision Making/Informed Consent:       Medication Adjustments/Labs and Tests Ordered: Current medicines are reviewed at length with the patient today.  Concerns regarding medicines are outlined above.   Orders Placed This Encounter  Procedures   EKG 12-Lead    No orders of the defined types were placed in this encounter.   Patient Instructions  Medication Instructions:  No Changes In Medications at this time.  *If you need a refill on your cardiac medications before your next appointment, please call your pharmacy*  Follow-Up: At [facility], you and your health needs are our priority.  As part of our continuing mission to provide you with exceptional heart care, we have created designated Provider Care Teams.  These Care Teams include your primary Cardiologist (physician) and Advanced Practice Providers (APPs -  Physician Assistants and Nurse Practitioners) who all work together to provide you with the care you need, when you need it.  Your next appointment:   2-3 WEEKS WITH Dr. Stanford Breed or APP   The format for your next appointment:   In Person  Provider:   Cherlynn Kaiser, MD  Other Instructions PLEASE REPORT TO [place]

## 2021-08-03 NOTE — Patient Instructions (Signed)
Medication Instructions:  No Changes In Medications at this time.  *If you need a refill on your cardiac medications before your next appointment, please call your pharmacy*  Follow-Up: At [facility], you and your health needs are our priority.  As part of our continuing mission to provide you with exceptional heart care, we have created designated Provider Care Teams.  These Care Teams include your primary Cardiologist (physician) and Advanced Practice Providers (APPs -  Physician Assistants and Nurse Practitioners) who all work together to provide you with the care you need, when you need it.  Your next appointment:   2-3 WEEKS WITH Dr. Stanford Breed or APP   The format for your next appointment:   In Person  Provider:   Cherlynn Kaiser, MD  Other Instructions PLEASE REPORT TO [place]

## 2021-08-03 NOTE — Progress Notes (Signed)
Cardiology Office Note:    Date:  08/03/2021   ID:  [name], DOB 14-Feb-1956, MRN [id number]  PCP:  Merrilee Seashore, MD  Cardiologist:  Kirk Ruths, MD  Electrophysiologist:  None   Referring MD: Merrilee Seashore, MD   Chief Complaint/Reason for Referral: Acute care visit: Chest pain  History of Present Illness:    Emma Wilson is a 65 y.o. female with a history of active smoking, hyperlipidemia history of COPD, hypothyroidism.  Presents for an acute care visit after calling into the nursing triage line this morning with chest pain for the last 4 days.  Thought it was heartburn.  Drank a Coke to see if it would help her belch and feel better, this did not help. Got hot on Friday afternoon going home from work, pain in chest - felt like indigestion. Stopped to get something to eat.  She ate at [facility] but still did not feel better.  She works for [employer] and is on her feet all day. Worked on Friday, works at [employer]. Pressure on chest. Lower substernal pain. Hours of pain, on Friday she went to sleep after taking tizanidine and pain went away. Chest pain recurred on Saturday morning, hours of pain.  She was organizing a birthday party and forgot about her chest pain that day after getting busy.  Sunday chest pain recurred, comes and goes, but was severe enough that she thought about going to the ER, 9 out of 10. Thought about going to ER on Friday and Sunday. Friday pain 9/10, Saturday 4/10.   Smoker.   Mother had MI.   Been taking ASA 81 mg daily given concerns about cardiovascular health over the last several days.  Does not have a known history of hypertension.  Does have hyperlipidemia, not currently on therapy.  LDL 126.  The patient denies dyspnea at rest or with exertion, palpitations, PND, orthopnea, or leg swelling. Denies cough, fever, chills. Denies nausea, vomiting. Denies syncope or presyncope. Denies dizziness or lightheadedness.  She  is experiencing fatigue with her chest pain.   Past Medical History:  Diagnosis Date   Abnormality of gait 03/19/2014   one leg shorter than other"uses cane frequently"   Carpal tunnel syndrome    right wrist   Chronic back pain    DDD   COPD (chronic obstructive pulmonary disease) ([place])    Depression 2006   "after my son passed" (12/05/2013)   Fibromyalgia    GERD (gastroesophageal reflux disease)    Hematuria    microscopic hematuria (urol & nephrol w/u neg), multiple UTI`s, glomerular from [facility], Dr. Jeffie Pollock 2014   Hyperlipidemia    managed by Dr. Ouida Sills   Hypothyroidism    Leg length inequality    sees rheumatology   Migraine    "q 3-4 months" (12/05/2013)   Osteoarthritis    Rheumatoid and OA?  Dr. Titus Mould ([facility], rheumatology)   Pneumonia    "abuntantly as a child"   Rheumatoid arthritis ([place])    Dr. Titus Mould   Shortness of breath    "at rest" (12/05/2013)   Sinus bradycardia    Thyroid cancer ([place])    S/P radiation & OR    Past Surgical History:  Procedure Laterality Date   COLONOSCOPY  10/13/2010   external and internal hemorrhoids, repeat 2021; Dr. Clarene Essex   CYSTOSCOPY  02/26/2013   Dr. Jeffie Pollock (normal; follicular cystitis)   EUS N/A 03/18/2016   Procedure: UPPER ENDOSCOPIC ULTRASOUND (EUS) RADIAL;  Surgeon: Milus Banister, MD;  Location: [facility];  Service: Endoscopy;  Laterality: N/A;   THYROIDECTOMY, PARTIAL  11/2005; 11/2006   TOTAL ABDOMINAL HYSTERECTOMY  ~ 1990   due to bleeding   TUBAL LIGATION  1980's    Current Medications: Current Meds  Medication Sig   Ascorbic Acid (VITAMIN C) 1000 MG tablet Take 1,000 mg by mouth daily.   BLACK ELDERBERRY PO Take 15 mLs by mouth daily.    calcium gluconate 500 MG tablet Take 500 mg by mouth daily.   diclofenac sodium (VOLTAREN) 1 % GEL Apply 1 application topically 3 (three) times daily as needed (For pain.).    gabapentin (NEURONTIN) 300 MG capsule Take 300 mg by mouth 3 (three) times daily.     nicotine (NICODERM CQ) 14 mg/24hr patch Place 1 patch (14 mg total) onto the skin daily.   nicotine (NICODERM CQ) 21 mg/24hr patch Place 1 patch (21 mg total) onto the skin daily.   nicotine (NICODERM CQ) 7 mg/24hr patch Place 1 patch (7 mg total) onto the skin daily.   nitrofurantoin, macrocrystal-monohydrate, (MACROBID) 100 MG capsule Take 1 capsule (100 mg total) by mouth 2 (two) times daily.   OVER THE COUNTER MEDICATION Take 1 each by mouth daily. CBD Oil, CBD tea with tumeric and ginger, and CBD honey   OVER THE COUNTER MEDICATION Take 1 each by mouth daily. Pochai herb used for nausea   pregabalin (LYRICA) 50 MG capsule 1 capsule   SYNTHROID 137 MCG tablet Take 1 tablet (137 mcg total) by mouth daily before breakfast.   tiZANidine (ZANAFLEX) 4 MG tablet Take 4 mg by mouth daily.    traMADol (ULTRAM) 50 MG tablet Take 50 mg by mouth 3 (three) times daily as needed.     Allergies:   Patient has no known allergies.   Social History   Tobacco Use   Smoking status: Every Day    Packs/day: 0.50    Years: 35.00    Pack years: 17.50    Types: Cigarettes   Smokeless tobacco: Never  Substance Use Topics   Alcohol use: Yes    Alcohol/week: 0.0 standard drinks    Comment: once to twice a week    Drug use: No     Family History: The patient's family history includes Arthritis in her sister and sister; Cancer in her father; Coronary artery disease in her mother; Diabetes in her son; Fibromyalgia in her sister; Heart disease in her mother; Kidney disease in her mother; Stroke in her mother.  ROS:   Please see the history of present illness.    All other systems reviewed and are negative.  EKGs/Labs/Other Studies Reviewed:    The following studies were reviewed today:  EKG:  NSR, minimal LVH criteria, no ST segment deviation.  Imaging studies that I have independently reviewed today: CT abdomen pelvis 03/12/2014, no obvious coronary artery calcifications are seen.  Recent Labs: No  results found for requested labs within last 8760 hours.  Recent Lipid Panel    Component Value Date/Time   CHOL 235 (H) 07/26/2016 0751   TRIG 104 07/26/2016 0751   HDL 46 07/26/2016 0751   CHOLHDL 5.1 (H) 07/26/2016 0751   VLDL 21 07/26/2016 0751   LDLCALC 168 (H) 07/26/2016 0751    Physical Exam:    VS:  BP 134/87   Pulse (!) 56   Ht '5\' 7"'$  (1.702 m)   Wt 128 lb 6.4 oz (58.2 kg)   BMI 20.11 kg/m  Wt Readings from Last 5 Encounters:  08/03/21 128 lb 6.4 oz (58.2 kg)  12/26/20 132 lb (59.9 kg)  11/26/20 138 lb (62.6 kg)  04/23/20 139 lb 1.8 oz (63.1 kg)  12/11/19 139 lb 3.2 oz (63.1 kg)    Constitutional: No acute distress Eyes: sclera non-icteric, normal conjunctiva and lids ENMT: normal dentition, moist mucous membranes Cardiovascular: regular rhythm, normal rate, no murmurs. S1 and S2 normal. Radial pulses normal bilaterally. No jugular venous distention.  Respiratory: clear to auscultation bilaterally GI : normal bowel sounds, soft and nontender. No distention.   MSK: extremities warm, well perfused. No edema.  NEURO: grossly nonfocal exam, moves all extremities. PSYCH: alert and oriented x 3, normal mood and affect.   ASSESSMENT:    1. Unstable angina pectoris ([place])   2. Pure hypercholesterolemia   3. TOBACCO ABUSE    PLAN:    Symptoms are concerning for unstable angina starting on Friday with occasional 9 out of 10 substernal chest pressure.  She has not tried nitroglycerin.  I offered her a nitroglycerin in the office today with observation.  I have also recommended that she present to her nearest emergency department.  After discussing her options she would prefer to drive herself to our ER facility close to her work on [address], and does not feel comfortable taking a nitroglycerin prior to driving.  I recommended EMS transport her but she defers and will take her own private vehicle.  I have cautioned her on my concerns.  EKG shows no ST segment  deviation.  Recommend serial troponins in the ER, consider D-dimer for risk for PE.  She may be a good candidate for coronary CTA at [facility], and I have recommended that she present to [facility] primarily, however she is very concerned about the COVID-19 exposure as well as wait times.  Close follow-up will be arranged to follow-up the results of this encounter.   Cherlynn Kaiser, MD, [place]   Shared Decision Making/Informed Consent:       Medication Adjustments/Labs and Tests Ordered: Current medicines are reviewed at length with the patient today.  Concerns regarding medicines are outlined above.   Orders Placed This Encounter  Procedures   EKG 12-Lead    No orders of the defined types were placed in this encounter.   Patient Instructions  Medication Instructions:  No Changes In Medications at this time.  *If you need a refill on your cardiac medications before your next appointment, please call your pharmacy*  Follow-Up: At [facility], you and your health needs are our priority.  As part of our continuing mission to provide you with exceptional heart care, we have created designated Provider Care Teams.  These Care Teams include your primary Cardiologist (physician) and Advanced Practice Providers (APPs -  Physician Assistants and Nurse Practitioners) who all work together to provide you with the care you need, when you need it.  Your next appointment:   2-3 WEEKS WITH Dr. Stanford Breed or APP   The format for your next appointment:   In Person  Provider:   Cherlynn Kaiser, MD  Other Instructions PLEASE REPORT TO [place]

## 2021-08-04 ENCOUNTER — Encounter (HOSPITAL_BASED_OUTPATIENT_CLINIC_OR_DEPARTMENT_OTHER): Payer: Self-pay | Admitting: Emergency Medicine

## 2021-08-04 ENCOUNTER — Emergency Department (HOSPITAL_BASED_OUTPATIENT_CLINIC_OR_DEPARTMENT_OTHER): Payer: 59

## 2021-08-04 ENCOUNTER — Other Ambulatory Visit: Payer: Self-pay

## 2021-08-04 ENCOUNTER — Observation Stay (HOSPITAL_BASED_OUTPATIENT_CLINIC_OR_DEPARTMENT_OTHER)
Admission: EM | Admit: 2021-08-04 | Discharge: 2021-08-07 | Disposition: A | Discharge status: Home / Self Care | Payer: 59 | Attending: Internal Medicine | Admitting: Internal Medicine

## 2021-08-04 ENCOUNTER — Emergency Department (HOSPITAL_BASED_OUTPATIENT_CLINIC_OR_DEPARTMENT_OTHER): Payer: 59 | Admitting: Radiology

## 2021-08-04 DIAGNOSIS — Z8249 Family history of ischemic heart disease and other diseases of the circulatory system: Secondary | ICD-10-CM | POA: Insufficient documentation

## 2021-08-04 DIAGNOSIS — Z85850 Personal history of malignant neoplasm of thyroid: Secondary | ICD-10-CM | POA: Diagnosis not present

## 2021-08-04 DIAGNOSIS — I200 Unstable angina: Secondary | ICD-10-CM

## 2021-08-04 DIAGNOSIS — R931 Abnormal findings on diagnostic imaging of heart and coronary circulation: Secondary | ICD-10-CM

## 2021-08-04 DIAGNOSIS — E7800 Pure hypercholesterolemia, unspecified: Secondary | ICD-10-CM | POA: Diagnosis not present

## 2021-08-04 DIAGNOSIS — E039 Hypothyroidism, unspecified: Secondary | ICD-10-CM | POA: Diagnosis not present

## 2021-08-04 DIAGNOSIS — F17210 Nicotine dependence, cigarettes, uncomplicated: Secondary | ICD-10-CM | POA: Diagnosis not present

## 2021-08-04 DIAGNOSIS — Z20822 Contact with and (suspected) exposure to covid-19: Secondary | ICD-10-CM | POA: Diagnosis not present

## 2021-08-04 DIAGNOSIS — J449 Chronic obstructive pulmonary disease, unspecified: Secondary | ICD-10-CM | POA: Diagnosis not present

## 2021-08-04 DIAGNOSIS — R079 Chest pain, unspecified: Secondary | ICD-10-CM | POA: Diagnosis present

## 2021-08-04 DIAGNOSIS — Z7982 Long term (current) use of aspirin: Secondary | ICD-10-CM | POA: Insufficient documentation

## 2021-08-04 DIAGNOSIS — Z79890 Hormone replacement therapy (postmenopausal): Secondary | ICD-10-CM | POA: Insufficient documentation

## 2021-08-04 DIAGNOSIS — I25110 Atherosclerotic heart disease of native coronary artery with unstable angina pectoris: Principal | ICD-10-CM | POA: Insufficient documentation

## 2021-08-04 DIAGNOSIS — Z955 Presence of coronary angioplasty implant and graft: Secondary | ICD-10-CM

## 2021-08-04 DIAGNOSIS — R0789 Other chest pain: Secondary | ICD-10-CM | POA: Diagnosis present

## 2021-08-04 DIAGNOSIS — Z79899 Other long term (current) drug therapy: Secondary | ICD-10-CM | POA: Diagnosis not present

## 2021-08-04 DIAGNOSIS — I700 Atherosclerosis of aorta: Secondary | ICD-10-CM | POA: Diagnosis not present

## 2021-08-04 LAB — CBC
HCT: 36.3 % (ref 36.0–46.0)
Hemoglobin: 12.5 g/dL (ref 12.0–15.0)
MCH: 28 pg (ref 26.0–34.0)
MCHC: 34.4 g/dL (ref 30.0–36.0)
MCV: 81.4 fL (ref 80.0–100.0)
Platelets: 280 10*3/uL (ref 150–400)
RBC: 4.46 MIL/uL (ref 3.87–5.11)
RDW: 12.9 % (ref 11.5–15.5)
WBC: 3.8 10*3/uL — ABNORMAL LOW (ref 4.0–10.5)
nRBC: 0 % (ref 0.0–0.2)

## 2021-08-04 LAB — BASIC METABOLIC PANEL
Anion gap: 10 (ref 5–15)
BUN: 14 mg/dL (ref 8–23)
CO2: 27 mmol/L (ref 22–32)
Calcium: 9.7 mg/dL (ref 8.9–10.3)
Chloride: 102 mmol/L (ref 98–111)
Creatinine, Ser: 0.78 mg/dL (ref 0.44–1.00)
GFR, Estimated: >60 mL/min (ref >60)
Glucose, Bld: 87 mg/dL (ref 70–99)
Potassium: 3.8 mmol/L (ref 3.5–5.1)
Sodium: 139 mmol/L (ref 135–145)

## 2021-08-04 LAB — RESP PANEL BY RT-PCR (FLU A&B, COVID) ARPGX2
Influenza A by PCR: NEGATIVE
Influenza B by PCR: NEGATIVE
SARS Coronavirus 2 by RT PCR: NEGATIVE

## 2021-08-04 LAB — TROPONIN I (HIGH SENSITIVITY)
Troponin I (High Sensitivity): <2 ng/L (ref <18)
Troponin I (High Sensitivity): <2 ng/L (ref <18)

## 2021-08-04 LAB — D-DIMER, QUANTITATIVE: D-Dimer, Quant: 0.86 ug/mL-FEU — ABNORMAL HIGH (ref 0.00–0.50)

## 2021-08-04 MED ORDER — IOHEXOL 350 MG/ML SOLN
100.0000 mL | Freq: Once | INTRAVENOUS | Status: AC | PRN
  Administered 2021-08-04: 75 mL via INTRAVENOUS

## 2021-08-04 NOTE — ED Notes (Signed)
Report called to [place], RN on [facility].

## 2021-08-04 NOTE — ED Notes (Signed)
Pt refused transport by [facility].  Pt going POV,  IV in place for POV transport. Secured with coban.  Pt stable for transfer by POV. Pt friend to drive.

## 2021-08-04 NOTE — ED Notes (Signed)
Patient transported to CT 

## 2021-08-04 NOTE — ED Provider Notes (Signed)
[facility] Provider Note   CSN: [id number] Arrival date & time: 08/04/21  1423     History Chief Complaint  Patient presents with   Chest Pain    Emma Wilson is a 65 y.o. female with a past medical history significant for fibromyalgia, GERD, hyperlipidemia, hypothyroidism, RA, history of thyroid cancer who presents to the ED due to intermittent episodes of central chest pain x5 days.  Patient was evaluated by cardiology (Dr. Margaretann Loveless) yesterday and was instructed to report to the ED for further evaluation due to concerns about unstable angina however, patient states she began feeling better so did not report to the ED then.  Patient states chest pain started 5 days ago.  No relation to exertion or deep inspiration.  Patient states pain is typically in the center of her chest and sometimes throughout right side of ribs.  No history of blood clots, recent surgeries, recent long immobilizations, or hormonal treatments.  Denies lower extremity edema.  No previous MI or CVA.  She admits to tobacco use.  Patient states Friday and Saturday her chest pain was associated with diaphoresis and shortness of breath. No treatment prior to arrival.  No aggravating alleviating factors.  History obtained from patient and past medical records. No interpreter used during encounter.       Past Medical History:  Diagnosis Date   Abnormality of gait 03/19/2014   one leg shorter than other"uses cane frequently"   Carpal tunnel syndrome    right wrist   Chronic back pain    DDD   COPD (chronic obstructive pulmonary disease) ([place])    Depression 2006   "after my son passed" (12/05/2013)   Fibromyalgia    GERD (gastroesophageal reflux disease)    Hematuria    microscopic hematuria (urol & nephrol w/u neg), multiple UTI`s, glomerular from [facility], Dr. Jeffie Pollock 2014   Hyperlipidemia    managed by Dr. Ouida Sills   Hypothyroidism    Leg length inequality    sees rheumatology    Migraine    "q 3-4 months" (12/05/2013)   Osteoarthritis    Rheumatoid and OA?  Dr. Titus Mould ([facility], rheumatology)   Pneumonia    "abuntantly as a child"   Rheumatoid arthritis ([place])    Dr. Titus Mould   Shortness of breath    "at rest" (12/05/2013)   Sinus bradycardia    Thyroid cancer ([place])    S/P radiation & OR    Patient Active Problem List   Diagnosis Date Noted   Asymptomatic microscopic hematuria 06/26/2017   History of kidney stones 05/24/2017   History of thyroid cancer 05/24/2017   Encounter for health maintenance examination in adult 07/26/2016   Need for prophylactic vaccination and inoculation against influenza 07/26/2016   History of COPD 07/26/2016   Hereditary and idiopathic peripheral neuropathy 07/26/2016   Decreased radial pulse 07/26/2016   Decreased pulses in feet 07/26/2016   Abnormality of gait 03/19/2014   Pure hypercholesterolemia 01/24/2013   Hematuria, unspecified 01/24/2013   Fibromyalgia 01/24/2013   Bradycardia 03/25/2011   TOBACCO ABUSE 02/12/2010   Hypothyroidism 02/11/2010   COPD 02/11/2010   GERD 02/11/2010    Past Surgical History:  Procedure Laterality Date   COLONOSCOPY  10/13/2010   external and internal hemorrhoids, repeat 2021; Dr. Clarene Essex   CYSTOSCOPY  02/26/2013   Dr. Jeffie Pollock (normal; follicular cystitis)   EUS N/A 03/18/2016   Procedure: UPPER ENDOSCOPIC ULTRASOUND (EUS) RADIAL;  Surgeon: Milus Banister, MD;  Location:  [facility];  Service: Endoscopy;  Laterality: N/A;   THYROIDECTOMY, PARTIAL  11/2005; 11/2006   TOTAL ABDOMINAL HYSTERECTOMY  ~ 1990   due to bleeding   TUBAL LIGATION  1980's     OB History   No obstetric history on file.     Family History  Problem Relation Age of Onset   Stroke Mother    Coronary artery disease Mother    Kidney disease Mother        on dialysis   Heart disease Mother    Cancer Father        multiple myeloma   Arthritis Sister        rheumatoid and osteoarthritis   Fibromyalgia  Sister    Arthritis Sister        RA and OA   Diabetes Son     Social History   Tobacco Use   Smoking status: Every Day    Packs/day: 0.50    Years: 35.00    Pack years: 17.50    Types: Cigarettes   Smokeless tobacco: Never  Substance Use Topics   Alcohol use: Yes    Alcohol/week: 0.0 standard drinks    Comment: once to twice a week    Drug use: No    Home Medications Prior to Admission medications   Medication Sig Start Date End Date Taking? Authorizing Provider  Ascorbic Acid (VITAMIN C) 1000 MG tablet Take 1,000 mg by mouth daily.    [provider]  BLACK ELDERBERRY PO Take 15 mLs by mouth daily.     [provider]  calcium gluconate 500 MG tablet Take 500 mg by mouth daily.    [provider]  diclofenac sodium (VOLTAREN) 1 % GEL Apply 1 application topically 3 (three) times daily as needed (For pain.).     [provider]  gabapentin (NEURONTIN) 300 MG capsule Take 300 mg by mouth 3 (three) times daily.  04/24/19   [provider]  nicotine (NICODERM CQ) 14 mg/24hr patch Place 1 patch (14 mg total) onto the skin daily. 05/09/20   Lelon Perla, MD  nicotine (NICODERM CQ) 21 mg/24hr patch Place 1 patch (21 mg total) onto the skin daily. 05/09/20   Lelon Perla, MD  nicotine (NICODERM CQ) 7 mg/24hr patch Place 1 patch (7 mg total) onto the skin daily. 05/09/20   Lelon Perla, MD  nitrofurantoin, macrocrystal-monohydrate, (MACROBID) 100 MG capsule Take 1 capsule (100 mg total) by mouth 2 (two) times daily. 01/28/21   Lamptey, Myrene Galas, MD  OVER THE COUNTER MEDICATION Take 1 each by mouth daily. CBD Oil, CBD tea with tumeric and ginger, and CBD honey    [provider]  OVER THE COUNTER MEDICATION Take 1 each by mouth daily. [place] herb used for nausea    [provider]  pregabalin (LYRICA) 50 MG capsule 1 capsule    [provider]  SYNTHROID 137 MCG tablet Take 1 tablet (137 mcg total) by mouth  daily before breakfast. 07/27/16   Tysinger, Camelia Eng, PA-C  tiZANidine (ZANAFLEX) 4 MG tablet Take 4 mg by mouth daily.  08/17/18   [provider]  traMADol (ULTRAM) 50 MG tablet Take 50 mg by mouth 3 (three) times daily as needed. 07/08/21   [provider]    Allergies    Patient has no known allergies.  Review of Systems   Review of Systems  Constitutional:  Negative for chills and fever.  Respiratory:  Positive for  shortness of breath.   Cardiovascular:  Positive for chest pain. Negative for leg swelling.  Gastrointestinal:  Negative for abdominal pain.  All other systems reviewed and are negative.  Physical Exam Updated Vital Signs BP (!) 145/87 (BP Location: Right Arm)   Pulse (!) 48   Temp 98.2 F (36.8 C) (Oral)   Resp 16   Ht $R'5\' 7"'tt$  (1.702 m)   Wt 58.1 kg   SpO2 100%   BMI 20.05 kg/m   Physical Exam Vitals and nursing note reviewed.  Constitutional:      General: She is not in acute distress.    Appearance: She is not ill-appearing.  HENT:     Head: Normocephalic.  Eyes:     Pupils: Pupils are equal, round, and reactive to light.  Cardiovascular:     Rate and Rhythm: Normal rate and regular rhythm.     Pulses: Normal pulses.     Heart sounds: Normal heart sounds. No murmur heard.   No friction rub. No gallop.  Pulmonary:     Effort: Pulmonary effort is normal.     Breath sounds: Normal breath sounds.  Abdominal:     General: Abdomen is flat. There is no distension.     Palpations: Abdomen is soft.     Tenderness: There is no abdominal tenderness. There is no guarding or rebound.  Musculoskeletal:        General: Normal range of motion.     Cervical back: Neck supple.     Comments: No lower extremity edema. Negative calf tenderness bilaterally.   Skin:    General: Skin is warm and dry.  Neurological:     General: No focal deficit present.     Mental Status: She is alert.  Psychiatric:        Mood and Affect: Mood normal.         Behavior: Behavior normal.    ED Results / Procedures / Treatments   Labs (all labs ordered are listed, but only abnormal results are displayed) Labs Reviewed  CBC - Abnormal; Notable for the following components:      Result Value   WBC 3.8 (*)    All other components within normal limits  D-DIMER, QUANTITATIVE - Abnormal; Notable for the following components:   D-Dimer, Quant 0.86 (*)    All other components within normal limits  RESP PANEL BY RT-PCR (FLU A&B, COVID) ARPGX2  BASIC METABOLIC PANEL  TROPONIN I (HIGH SENSITIVITY)  TROPONIN I (HIGH SENSITIVITY)    EKG EKG Interpretation  Date/Time:  Tuesday August 04 2021 15:17:00 EDT Ventricular Rate:  65 PR Interval:  148 QRS Duration: 80 QT Interval:  392 QTC Calculation: 407 R Axis:   73 Text Interpretation: Normal sinus rhythm Minimal voltage criteria for LVH, may be normal variant ( Sokolow-Lyon ) Nonspecific T wave abnormality Abnormal ECG No significant change since last tracing Confirmed by Isla Pence [phone]) on 08/04/2021 4:19:49 PM  Radiology DG Chest 2 View  Result Date: 08/04/2021 CLINICAL DATA:  Chest pain beginning several days ago. EXAM: CHEST - 2 VIEW COMPARISON:  04/23/2020 FINDINGS: Heart size is normal. Aortic atherosclerotic calcification. 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 No sign of active infiltrate, mass, effusion or collapse. No acute bone finding. Surgical clips at the thyroid region. IMPRESSION: No active disease. Background emphysema and pulmonary scarring. Aortic atherosclerotic calcification. Electronically Signed   By: Nelson Chimes M.D.   On: 08/04/2021 16:03    Procedures Procedures   Medications Ordered  in ED Medications  iohexol (OMNIPAQUE) 350 MG/ML injection 100 mL (75 mLs Intravenous Contrast Given 08/04/21 2145)    ED Course  I have reviewed the triage vital signs and the nursing notes.  Pertinent labs & imaging results that were available during my care of the  patient were reviewed by me and considered in my medical decision making (see chart for details).  Clinical Course as of 08/04/21 2149  Tue Aug 04, 2021  1936 WBC(!): 3.8 [CA]    Clinical Course User Index [CA] Suzy Bouchard, PA-C   MDM Rules/Calculators/A&P                          65 year old female presents to the ED due to intermittent chest pain x5 days.  Patient was evaluated by cardiology yesterday and instructed to report to the ED due to concerns about unstable angina.  Upon arrival, stable vitals.  Patient in no acute distress.  Benign physical exam.  Cardiac labs ordered.  Added D-dimer to rule out PE given cardiology recommendations.  Patient is currently chest pain-free.  Delta troponin flat.  COVID/influenza negative.  CBC significant for leukopenia 3.8.  Normal hemoglobin.  BMP unremarkable with normal renal function no major electrolyte derangements.  EKG demonstrates normal sinus rhythm with nonspecific T wave abnormalities.  Chest x-ray personally reviewed which is negative for signs of pneumonia, pneumothorax, or widened mediastinum.  Discussed with Dr. Clayton Bibles with cardiology who recommends admission if patient has had episodes of chest pain while here in the ED.   Patient does endorse some episodes of chest pain while here in the ER. She will require admission. Dr. Clayton Bibles recommends medicine admission. Cardiology will follow. Dr. Clayton Bibles is aware patient will be admitted.   Patient handed off to Dr. Gilford Raid at shift change pending CTA chest and admission. COVID negative.  Final Clinical Impression(s) / ED Diagnoses Final diagnoses:  Nonspecific chest pain    Rx / DC Orders ED Discharge Orders     None        Karie Kirks 08/04/21 2151    Isla Pence, MD 08/04/21 2236

## 2021-08-04 NOTE — ED Triage Notes (Signed)
Chest pain since Friday. Sent from PCP. Pain is intermittent.

## 2021-08-05 ENCOUNTER — Observation Stay (HOSPITAL_COMMUNITY): Payer: 59

## 2021-08-05 ENCOUNTER — Other Ambulatory Visit: Payer: Self-pay | Admitting: Cardiovascular Disease

## 2021-08-05 ENCOUNTER — Encounter (HOSPITAL_COMMUNITY): Payer: Self-pay | Admitting: Internal Medicine

## 2021-08-05 ENCOUNTER — Observation Stay (HOSPITAL_COMMUNITY)
Admit: 2021-08-05 | Discharge: 2021-08-05 | Disposition: A | Discharge status: Home / Self Care | Payer: 59 | Attending: Cardiovascular Disease | Admitting: Cardiovascular Disease

## 2021-08-05 ENCOUNTER — Observation Stay (HOSPITAL_BASED_OUTPATIENT_CLINIC_OR_DEPARTMENT_OTHER): Payer: 59

## 2021-08-05 DIAGNOSIS — R079 Chest pain, unspecified: Secondary | ICD-10-CM

## 2021-08-05 DIAGNOSIS — R931 Abnormal findings on diagnostic imaging of heart and coronary circulation: Secondary | ICD-10-CM

## 2021-08-05 DIAGNOSIS — E7800 Pure hypercholesterolemia, unspecified: Secondary | ICD-10-CM

## 2021-08-05 DIAGNOSIS — I2510 Atherosclerotic heart disease of native coronary artery without angina pectoris: Secondary | ICD-10-CM | POA: Diagnosis not present

## 2021-08-05 LAB — CBC
HCT: 34.6 % — ABNORMAL LOW (ref 36.0–46.0)
Hemoglobin: 12.1 g/dL (ref 12.0–15.0)
MCH: 28.2 pg (ref 26.0–34.0)
MCHC: 35 g/dL (ref 30.0–36.0)
MCV: 80.7 fL (ref 80.0–100.0)
Platelets: 251 10*3/uL (ref 150–400)
RBC: 4.29 MIL/uL (ref 3.87–5.11)
RDW: 12.6 % (ref 11.5–15.5)
WBC: 3.6 10*3/uL — ABNORMAL LOW (ref 4.0–10.5)
nRBC: 0 % (ref 0.0–0.2)

## 2021-08-05 LAB — ECHOCARDIOGRAM COMPLETE
Area-P 1/2: 2.5 cm2
Height: 67 in
S' Lateral: 2.8 cm
Weight: 1992.96 oz

## 2021-08-05 LAB — BASIC METABOLIC PANEL
Anion gap: 9 (ref 5–15)
BUN: 10 mg/dL (ref 8–23)
CO2: 24 mmol/L (ref 22–32)
Calcium: 9 mg/dL (ref 8.9–10.3)
Chloride: 103 mmol/L (ref 98–111)
Creatinine, Ser: 0.74 mg/dL (ref 0.44–1.00)
GFR, Estimated: >60 mL/min (ref >60)
Glucose, Bld: 86 mg/dL (ref 70–99)
Potassium: 3.6 mmol/L (ref 3.5–5.1)
Sodium: 136 mmol/L (ref 135–145)

## 2021-08-05 LAB — LIPID PANEL
Cholesterol: 202 mg/dL — ABNORMAL HIGH (ref 0–200)
HDL: 45 mg/dL (ref >40)
LDL Cholesterol: 138 mg/dL — ABNORMAL HIGH (ref 0–99)
Total CHOL/HDL Ratio: 4.5 RATIO
Triglycerides: 93 mg/dL (ref <150)
VLDL: 19 mg/dL (ref 0–40)

## 2021-08-05 LAB — HIV ANTIBODY (ROUTINE TESTING W REFLEX): HIV Screen 4th Generation wRfx: NONREACTIVE

## 2021-08-05 MED ORDER — ALBUTEROL SULFATE (2.5 MG/3ML) 0.083% IN NEBU: 2.5000 mg | INHALATION_SOLUTION | Freq: Four times a day (QID) | RESPIRATORY_TRACT | Status: DC | PRN

## 2021-08-05 MED ORDER — NITROGLYCERIN 0.4 MG SL SUBL: 0.4000 mg | SUBLINGUAL_TABLET | SUBLINGUAL | Status: DC | PRN

## 2021-08-05 MED ORDER — NITROGLYCERIN 0.4 MG SL SUBL
SUBLINGUAL_TABLET | SUBLINGUAL | Status: AC
  Administered 2021-08-05: 0.8 mg via SUBLINGUAL
  Filled 2021-08-05: qty 2

## 2021-08-05 MED ORDER — LEVOTHYROXINE SODIUM 25 MCG PO TABS
137.0000 ug | ORAL_TABLET | Freq: Every day | ORAL | Status: DC
  Administered 2021-08-05 – 2021-08-07 (×3): 137 ug via ORAL
  Filled 2021-08-05 (×3): qty 1

## 2021-08-05 MED ORDER — ONDANSETRON HCL 4 MG/2ML IJ SOLN: 4.0000 mg | Freq: Four times a day (QID) | INTRAMUSCULAR | Status: DC | PRN

## 2021-08-05 MED ORDER — ASPIRIN EC 81 MG PO TBEC
81.0000 mg | DELAYED_RELEASE_TABLET | Freq: Every day | ORAL | Status: DC
  Administered 2021-08-05 – 2021-08-07 (×2): 81 mg via ORAL
  Filled 2021-08-05 (×3): qty 1

## 2021-08-05 MED ORDER — IOHEXOL 350 MG/ML SOLN
95.0000 mL | Freq: Once | INTRAVENOUS | Status: AC | PRN
  Administered 2021-08-05: 95 mL via INTRAVENOUS

## 2021-08-05 MED ORDER — NICOTINE 14 MG/24HR TD PT24
14.0000 mg | MEDICATED_PATCH | Freq: Every day | TRANSDERMAL | Status: DC
  Administered 2021-08-05: 14 mg via TRANSDERMAL
  Filled 2021-08-05 (×4): qty 1

## 2021-08-05 MED ORDER — NITROGLYCERIN 0.4 MG SL SUBL: 0.8000 mg | SUBLINGUAL_TABLET | Freq: Once | SUBLINGUAL | Status: AC

## 2021-08-05 MED ORDER — ENOXAPARIN SODIUM 40 MG/0.4ML IJ SOSY
40.0000 mg | PREFILLED_SYRINGE | Freq: Every day | INTRAMUSCULAR | Status: DC
  Administered 2021-08-05 (×2): 40 mg via SUBCUTANEOUS
  Filled 2021-08-05 (×2): qty 0.4

## 2021-08-05 MED ORDER — TRAMADOL HCL 50 MG PO TABS
50.0000 mg | ORAL_TABLET | Freq: Four times a day (QID) | ORAL | Status: DC | PRN
Start: 2021-08-05 — End: 2021-08-07
  Administered 2021-08-05 – 2021-08-06 (×2): 50 mg via ORAL
  Filled 2021-08-05 (×2): qty 1

## 2021-08-05 MED ORDER — ACETAMINOPHEN 325 MG PO TABS
650.0000 mg | ORAL_TABLET | ORAL | Status: DC | PRN
  Administered 2021-08-05 – 2021-08-06 (×2): 650 mg via ORAL
  Filled 2021-08-05 (×2): qty 2

## 2021-08-05 NOTE — H&P (Signed)
History and Physical    Emma Wilson YNX:[id number] DOB: 02-26-1956 DOA: 08/04/2021  PCP: Merrilee Seashore, MD  Patient coming from: [facility]  I have personally briefly reviewed patient's old medical records in [place]  Chief Complaint: Chest pain  HPI: Emma Wilson is a 65 y.o. female with medical history significant for COPD, HLD, hypothyroidism, and tobacco use who presented to the ED for evaluation of chest pain.  Patient initially went to her cardiology office on 08/03/2021 for evaluation of chest pain that has been ongoing over the last 4 days.  She has been experiencing low substernal pressure-like chest pain lasting several hours and several episodes since onset.  Initially she thought this was indigestion/heartburn as she skipped a meal.  She tried drinking a Coke to see if it would help her belch to feel better but did not find significant relief.  She says she had also experienced new diaphoresis which is unusual for her as she usually runs "cold."  Her cardiologist recommended she go to the ED for evaluation due to concern for unstable angina.  Patient waited to come to the ED as she stated that she was feeling better but symptoms returned and she presented to the ED on 08/04/2021.  She is currently chest pain-free.  She denies any similar symptoms in the past.  She states that she does take aspirin 81 mg daily but took an extra dose 2 days ago.  She reports ongoing tobacco use, 1 pack lasts her 2-3 days.  She otherwise denies any dyspnea, cough, nausea, vomiting, dysuria.  She reports chronic back pain and neuropathy related to fibromyalgia and lumbar radiculopathy.  ED Course:  Initial vitals showed BP 114/80, pulse 65, RR 18, temp 98.2 F, SPO2 96% on room air.   Labs showed WBC 3.8, hemoglobin 12.5, platelets 280,000, sodium 139, potassium 3.8, bicarb 27, BUN 14, creatinine 0.78, serum glucose 87, high-sensitivity troponin I <2 x 2.  D-dimer  0.86.  SARS-CoV-2 PCR and influenza are negative.  2 view chest x-ray shows emphysematous changes and pulmonary scarring without focal consolidation, edema, or effusion.  CTA chest PE study is negative for evidence of PE.  Moderate to marked severity emphysematous lung disease noted.  Mild bilateral lower lobe linear scarring and/or atelectasis seen.  EDP discussed with on-call cardiology who recommended medical admission.  The hospitalist service was consulted to admit for further evaluation and management.  Review of Systems: All systems reviewed and are negative except as documented in history of present illness above.   Past Medical History:  Diagnosis Date   Abnormality of gait 03/19/2014   one leg shorter than other"uses cane frequently"   Carpal tunnel syndrome    right wrist   Chronic back pain    DDD   COPD (chronic obstructive pulmonary disease) ([place])    Depression 2006   "after my son passed" (12/05/2013)   Fibromyalgia    GERD (gastroesophageal reflux disease)    Hematuria    microscopic hematuria (urol & nephrol w/u neg), multiple UTI`s, glomerular from [facility], Dr. Jeffie Pollock 2014   Hyperlipidemia    managed by Dr. Ouida Sills   Hypothyroidism    Leg length inequality    sees rheumatology   Migraine    "q 3-4 months" (12/05/2013)   Osteoarthritis    Rheumatoid and OA?  Dr. Titus Mould [facility] medical, rheumatology)   Pneumonia    "abuntantly as a child"   Rheumatoid arthritis ([place])    Dr.  Syad   Shortness of breath    "at rest" (12/05/2013)   Sinus bradycardia    Thyroid cancer ([place])    S/P radiation & OR    Past Surgical History:  Procedure Laterality Date   COLONOSCOPY  10/13/2010   external and internal hemorrhoids, repeat 2021; Dr. Clarene Essex   CYSTOSCOPY  02/26/2013   Dr. Jeffie Pollock (normal; follicular cystitis)   EUS N/A 03/18/2016   Procedure: UPPER ENDOSCOPIC ULTRASOUND (EUS) RADIAL;  Surgeon: Milus Banister, MD;  Location: [facility] ENDOSCOPY;  Service: Endoscopy;   Laterality: N/A;   THYROIDECTOMY, PARTIAL  11/2005; 11/2006   TOTAL ABDOMINAL HYSTERECTOMY  ~ 1990   due to bleeding   TUBAL LIGATION  1980's    Social History:  reports that she has been smoking cigarettes. She has a 17.50 pack-year smoking history. She has never used smokeless tobacco. She reports current alcohol use. She reports that she does not use drugs.  No Known Allergies  Family History  Problem Relation Age of Onset   Stroke Mother    Coronary artery disease Mother    Kidney disease Mother        on dialysis   Heart disease Mother    Cancer Father        multiple myeloma   Arthritis Sister        rheumatoid and osteoarthritis   Fibromyalgia Sister    Arthritis Sister        RA and OA   Diabetes Son      Prior to Admission medications   Medication Sig Start Date End Date Taking? Authorizing Provider  Ascorbic Acid (VITAMIN C) 1000 MG tablet Take 1,000 mg by mouth daily.    [provider]  BLACK ELDERBERRY PO Take 15 mLs by mouth daily.     [provider]  calcium gluconate 500 MG tablet Take 500 mg by mouth daily.    [provider]  diclofenac sodium (VOLTAREN) 1 % GEL Apply 1 application topically 3 (three) times daily as needed (For pain.).     [provider]  gabapentin (NEURONTIN) 300 MG capsule Take 300 mg by mouth 3 (three) times daily.  04/24/19   [provider]  nicotine (NICODERM CQ) 14 mg/24hr patch Place 1 patch (14 mg total) onto the skin daily. 05/09/20   Lelon Perla, MD  nicotine (NICODERM CQ) 21 mg/24hr patch Place 1 patch (21 mg total) onto the skin daily. 05/09/20   Lelon Perla, MD  nicotine (NICODERM CQ) 7 mg/24hr patch Place 1 patch (7 mg total) onto the skin daily. 05/09/20   Lelon Perla, MD  nitrofurantoin, macrocrystal-monohydrate, (MACROBID) 100 MG capsule Take 1 capsule (100 mg total) by mouth 2 (two) times daily. 01/28/21   Lamptey, Myrene Galas, MD  OVER THE COUNTER MEDICATION Take 1  each by mouth daily. CBD Oil, CBD tea with tumeric and ginger, and CBD honey    [provider]  OVER THE COUNTER MEDICATION Take 1 each by mouth daily. [place] herb used for nausea    [provider]  pregabalin (LYRICA) 50 MG capsule 1 capsule    [provider]  SYNTHROID 137 MCG tablet Take 1 tablet (137 mcg total) by mouth daily before breakfast. 07/27/16   Tysinger, Camelia Eng, PA-C  tiZANidine (ZANAFLEX) 4 MG tablet Take 4 mg by mouth daily.  08/17/18   [provider]  traMADol (ULTRAM) 50 MG tablet Take 50 mg by mouth 3 (three) times daily  as needed. 07/08/21   [provider]    Physical Exam: Vitals:   08/04/21 2015 08/04/21 2115 08/04/21 2220 08/04/21 2331  BP: (!) 148/90 (!) 145/87 126/84 (!) 148/75  Pulse: (!) 50 (!) 48 (!) 47 63  Resp: _0 Temp:      TempSrc:      SpO2: 100% 100% 99% 100%  Weight:      Height:       Constitutional: Resting supine in bed, NAD, calm, comfortable Eyes: PERRL, lids and conjunctivae normal ENMT: Mucous membranes are moist. Posterior pharynx clear of any exudate or lesions.Normal dentition.  Neck: normal, supple, no masses. Respiratory: Bibasilar inspiratory crackles. Normal respiratory effort. No accessory muscle use.  Cardiovascular: Regular rate and rhythm, no murmurs / rubs / gallops. No extremity edema. 2+ pedal pulses. Abdomen: Mild left-sided tenderness, no masses palpated. No hepatosplenomegaly. Bowel sounds positive.  Musculoskeletal: no clubbing / cyanosis. No joint deformity upper and lower extremities. Good ROM, no contractures. Normal muscle tone.  Skin: no rashes, lesions, ulcers. No induration Neurologic: CN 2-12 grossly intact. Sensation intact. Strength 5/5 in all 4.  Psychiatric: Normal judgment and insight. Alert and oriented x 3. Normal mood.   Labs on Admission: I have personally reviewed following labs and imaging studies  CBC: Recent Labs  Lab 08/04/21 1530  WBC 3.8*   HGB 12.5  HCT 36.3  MCV 81.4  PLT 831   Basic Metabolic Panel: Recent Labs  Lab 08/04/21 1530  NA 139  K 3.8  CL 102  CO2 27  GLUCOSE 87  BUN 14  CREATININE 0.78  CALCIUM 9.7   GFR: Estimated Creatinine Clearance: 66 mL/min (by C-G formula based on SCr of 0.78 mg/dL). Liver Function Tests: No results for input(s): AST, ALT, ALKPHOS, BILITOT, PROT, ALBUMIN in the last 168 hours. No results for input(s): LIPASE, AMYLASE in the last 168 hours. No results for input(s): AMMONIA in the last 168 hours. Coagulation Profile: No results for input(s): INR, PROTIME in the last 168 hours. Cardiac Enzymes: No results for input(s): CKTOTAL, CKMB, CKMBINDEX, TROPONINI in the last 168 hours. BNP (last 3 results) No results for input(s): PROBNP in the last 8760 hours. HbA1C: No results for input(s): HGBA1C in the last 72 hours. CBG: No results for input(s): GLUCAP in the last 168 hours. Lipid Profile: No results for input(s): CHOL, HDL, LDLCALC, TRIG, CHOLHDL, LDLDIRECT in the last 72 hours. Thyroid Function Tests: No results for input(s): TSH, T4TOTAL, FREET4, T3FREE, THYROIDAB in the last 72 hours. Anemia Panel: No results for input(s): VITAMINB12, FOLATE, FERRITIN, TIBC, IRON, RETICCTPCT in the last 72 hours. Urine analysis:    Component Value Date/Time   COLORURINE YELLOW 03/12/2014 1620   APPEARANCEUR CLOUDY (A) 03/12/2014 1620   LABSPEC 1.020 03/12/2014 1620   PHURINE 5.5 03/12/2014 1620   GLUCOSEU NEGATIVE 03/12/2014 1620   HGBUR MODERATE (A) 03/12/2014 1620   BILIRUBINUR negative 01/26/2021 0905   BILIRUBINUR neg 07/26/2016 0852   KETONESUR negative 01/26/2021 0905   KETONESUR NEGATIVE 03/12/2014 1620   PROTEINUR negative 01/26/2021 0905   PROTEINUR neg 07/26/2016 0852   PROTEINUR NEGATIVE 03/12/2014 1620   UROBILINOGEN 0.2 01/26/2021 0905   UROBILINOGEN 0.2 03/12/2014 1620   NITRITE Positive (A) 01/26/2021 0905   NITRITE neg 07/26/2016 0852   NITRITE NEGATIVE  03/12/2014 1620   LEUKOCYTESUR Large (3+) (A) 01/26/2021 0905    Radiological Exams on Admission: DG Chest 2 View  Result Date: 08/04/2021 CLINICAL DATA:  Chest pain  beginning several days ago. EXAM: CHEST - 2 VIEW COMPARISON:  04/23/2020 FINDINGS: Heart size is normal. Aortic atherosclerotic calcification. 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 No sign of active infiltrate, mass, effusion or collapse. No acute bone finding. Surgical clips at the thyroid region. IMPRESSION: No active disease. Background emphysema and pulmonary scarring. Aortic atherosclerotic calcification. Electronically Signed   By: Nelson Chimes M.D.   On: 08/04/2021 16:03   CT Angio Chest PE W and/or Wo Contrast  Result Date: 08/04/2021 CLINICAL DATA:  Central chest pain x5 days. EXAM: CT ANGIOGRAPHY CHEST WITH CONTRAST TECHNIQUE: Multidetector CT imaging of the chest was performed using the standard protocol during bolus administration of intravenous contrast. Multiplanar CT image reconstructions and MIPs were obtained to evaluate the vascular anatomy. CONTRAST:  72m OMNIPAQUE IOHEXOL 350 MG/ML SOLN COMPARISON:  None. FINDINGS: Cardiovascular: There is mild calcification of the aortic arch without evidence of aortic aneurysm. Satisfactory opacification of the pulmonary arteries to the segmental level. No evidence of pulmonary embolism. Normal heart size with mild to moderate severity coronary artery calcification. No pericardial effusion. Mediastinum/Nodes: No enlarged mediastinal, hilar, or axillary lymph nodes. Thyroid gland is surgically absent. The trachea and esophagus demonstrate no significant findings. Lungs/Pleura: Moderate to marked severity emphysematous lung disease is seen involving predominantly the bilateral upper lobes. Mild areas of linear scarring and/or atelectasis are seen within the bilateral lower lobes. There is no evidence of a pleural effusion or pneumothorax. Upper Abdomen: No acute abnormality.  Musculoskeletal: No chest wall abnormality. No acute or significant osseous findings. Review of the MIP images confirms the above findings. IMPRESSION: 1. No evidence of pulmonary embolism. 2. Moderate to marked severity emphysematous lung disease. 3. Mild bilateral lower lobe linear scarring and/or atelectasis. Aortic Atherosclerosis (ICD10-I70.0) and Emphysema (ICD10-J43.9). Electronically Signed   By: TVirgina NorfolkM.D.   On: 08/04/2021 22:06    EKG: Personally reviewed. Normal sinus rhythm without acute ischemic changes.  Bradycardia resolved when compared to previous EKGs.  Assessment/Plan Principal Problem:   Chest pain Active Problems:   Hypothyroidism   COPD (chronic obstructive pulmonary disease) (H[facility])   Pure hypercholesterolemia   DGWENNETH WHITEMANis a 65y.o. female with medical history significant for COPD, HLD, hypothyroidism, and tobacco use who presented for evaluation of chest pain.  Chest pain: Concerning for unstable angina.  Cardiology recommended admission for further ischemic work-up.  She is currently chest pain-free.  Troponins negative x2, EKG without acute ischemic changes. -Monitor on telemetry -Obtain echocardiogram -Keep n.p.o. for now -Continue aspirin 81 mg daily -Further ischemic work-up per cardiology  COPD: Stable without acute respiratory symptoms or wheezing.  Continue albuterol as needed.  Hyperlipidemia: Not currently on statin.  Check lipid panel.  Hypothyroidism: Continue Synthroid.  Tobacco use: Reports smoking about 1/3 pack per day.  Smoking cessation advised.  DVT prophylaxis: Lovenox Code Status: Full code, confirmed with patient Family Communication: Discussed with patient, she has discussed with family Disposition Plan: From home, dispo pending clinical progress Consults called: EDP discussed with on-call cardiology Level of care: Telemetry Cardiac Admission status:  Status is: Observation  The patient remains OBS  appropriate and will d/c before 2 midnights.  Dispo: The patient is from: Home              Anticipated d/c is to: Home              Patient currently is not medically stable to d/c.   Difficult to place patient No  Emma Finders  MD [facility]  If 7PM-7AM, please contact night-coverage [email or web address]  08/05/2021, 12:43 AM

## 2021-08-05 NOTE — Progress Notes (Signed)
[facility] PROGRESS NOTE    Progress Note  Emma Wilson  HOZ:[id number] DOB: 18-Mar-1956 DOA: 08/04/2021 PCP: Emma Seashore, MD     Brief Narrative:   Emma Wilson is an 65 y.o. female past medical history significant for COPD, hyperlipidemia hypothyroidism who was seen by her PCP on 08/03/2021 for chest pain that have been going on for 4 days prior to her visit to her PCP, came into the ED on 08/04/2021 for chest pain, she is currently chest pain-free    Assessment/Plan:   Atypical Chest pain: More than a week of chest pain. Cardiology recommended admission for ischemic work-up. Cardiac biomarkers negative x3, twelve-lead EKG showed no ischemic changes. 2D echo is pending. Awaiting cardiology further recommendations.  COPD: Noted stable continue inhalers.  Hyperlipidemia, Continue statins.  Hypothyroidism: Continue Synthroid.  Tobacco abuse: Ongoing she has been counseled.   DVT prophylaxis: lovenox Family Communication:noen Status is: Observation  The patient remains OBS appropriate and will d/c before 2 midnights.  Dispo: The patient is from: Home              Anticipated d/c is to: Home              Patient currently is not medically stable to d/c.   Difficult to place patient No        Code Status:     Code Status Orders  (From admission, onward)           Start     Ordered   08/05/21 0108  Full code  Continuous        08/05/21 0109           Code Status History     Date Active Date Inactive Code Status Order ID Comments User Context   12/05/2013 1935 12/06/2013 2116 Full Code [id number]  Elmarie Shiley, MD Inpatient         IV Access:   Peripheral IV   Procedures and diagnostic studies:   DG Chest 2 View  Result Date: 08/04/2021 CLINICAL DATA:  Chest pain beginning several days ago. EXAM: CHEST - 2 VIEW COMPARISON:  04/23/2020 FINDINGS: Heart size is normal. Aortic atherosclerotic calcification.  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 No sign of active infiltrate, mass, effusion or collapse. No acute bone finding. Surgical clips at the thyroid region. IMPRESSION: No active disease. Background emphysema and pulmonary scarring. Aortic atherosclerotic calcification. Electronically Signed   By: Nelson Chimes M.D.   On: 08/04/2021 16:03   CT Angio Chest PE W and/or Wo Contrast  Result Date: 08/04/2021 CLINICAL DATA:  Central chest pain x5 days. EXAM: CT ANGIOGRAPHY CHEST WITH CONTRAST TECHNIQUE: Multidetector CT imaging of the chest was performed using the standard protocol during bolus administration of intravenous contrast. Multiplanar CT image reconstructions and MIPs were obtained to evaluate the vascular anatomy. CONTRAST:  88mL OMNIPAQUE IOHEXOL 350 MG/ML SOLN COMPARISON:  None. FINDINGS: Cardiovascular: There is mild calcification of the aortic arch without evidence of aortic aneurysm. Satisfactory opacification of the pulmonary arteries to the segmental Wilson. No evidence of pulmonary embolism. Normal heart size with mild to moderate severity coronary artery calcification. No pericardial effusion. Mediastinum/Nodes: No enlarged mediastinal, hilar, or axillary lymph nodes. Thyroid gland is surgically absent. The trachea and esophagus demonstrate no significant findings. Lungs/Pleura: Moderate to marked severity emphysematous lung disease is seen involving predominantly the bilateral upper lobes. Mild areas of linear scarring and/or atelectasis are seen within the bilateral lower lobes. There is no evidence  of a pleural effusion or pneumothorax. Upper Abdomen: No acute abnormality. Musculoskeletal: No chest wall abnormality. No acute or significant osseous findings. Review of the MIP images confirms the above findings. IMPRESSION: 1. No evidence of pulmonary embolism. 2. Moderate to marked severity emphysematous lung disease. 3. Mild bilateral lower lobe linear scarring and/or atelectasis.  Aortic Atherosclerosis (ICD10-I70.0) and Emphysema (ICD10-J43.9). Electronically Signed   By: Virgina Norfolk M.D.   On: 08/04/2021 22:06     Medical Consultants:   None.   Subjective:    Emma Wilson currently chest pain-free patient cannot provide an accurate history  Objective:    Vitals:   08/04/21 2220 08/04/21 2331 08/05/21 0049 08/05/21 0602  BP: 126/84 (!) 148/75 132/81 109/76  Pulse: (!) 47 63  (!) 46  Resp: 16 18 12 19   Temp:   98.2 F (36.8 C) 98.4 F (36.9 C)  TempSrc:   Oral Oral  SpO2: 99% 100% 96% 95%  Weight:   56.5 kg   Height:   5\' 7"  (1.702 m)    SpO2: 95 %  No intake or output data in the 24 hours ending 08/05/21 0835 Filed Weights   08/04/21 1512 08/05/21 0049  Weight: 58.1 kg 56.5 kg    Exam: General exam: In no acute distress. Respiratory system: Good air movement and clear to auscultation. Cardiovascular system: S1 & S2 heard, RRR. No JVD. Gastrointestinal system: Abdomen is nondistended, soft and nontender.  Extremities: No pedal edema. Skin: No rashes, lesions or ulcers Psychiatry: Judgement and insight appear normal. Mood & affect appropriate.    Data Reviewed:    Labs: Basic Metabolic Panel: Recent Labs  Lab 08/04/21 1530 08/05/21 0218  NA 139 136  K 3.8 3.6  CL 102 103  CO2 27 24  GLUCOSE 87 86  BUN 14 10  CREATININE 0.78 0.74  CALCIUM 9.7 9.0   GFR Estimated Creatinine Clearance: 64.2 mL/min (by C-G formula based on SCr of 0.74 mg/dL). Liver Function Tests: No results for input(s): AST, ALT, ALKPHOS, BILITOT, PROT, ALBUMIN in the last 168 hours. No results for input(s): LIPASE, AMYLASE in the last 168 hours. No results for input(s): AMMONIA in the last 168 hours. Coagulation profile No results for input(s): INR, PROTIME in the last 168 hours. COVID-19 Labs  Recent Labs    08/04/21 1920  DDIMER 0.86*    Lab Results  Component Value Date   SARSCOV2NAA NEGATIVE 08/04/2021    CBC: Recent Labs  Lab  08/04/21 1530 08/05/21 0218  WBC 3.8* 3.6*  HGB 12.5 12.1  HCT 36.3 34.6*  MCV 81.4 80.7  PLT 280 251   Cardiac Enzymes: No results for input(s): CKTOTAL, CKMB, CKMBINDEX, TROPONINI in the last 168 hours. BNP (last 3 results) No results for input(s): PROBNP in the last 8760 hours. CBG: No results for input(s): GLUCAP in the last 168 hours. D-Dimer: Recent Labs    08/04/21 1920  DDIMER 0.86*   Hgb A1c: No results for input(s): HGBA1C in the last 72 hours. Lipid Profile: Recent Labs    08/05/21 0218  CHOL 202*  HDL 45  LDLCALC 138*  TRIG 93  CHOLHDL 4.5   Thyroid function studies: No results for input(s): TSH, T4TOTAL, T3FREE, THYROIDAB in the last 72 hours.  Invalid input(s): FREET3 Anemia work up: No results for input(s): VITAMINB12, FOLATE, FERRITIN, TIBC, IRON, RETICCTPCT in the last 72 hours. Sepsis Labs: Recent Labs  Lab 08/04/21 1530 08/05/21 0218  WBC 3.8* 3.6*   Microbiology Recent Results (  from the past 240 hour(s))  Resp Panel by RT-PCR (Flu A&B, Covid) Nasopharyngeal Swab     Status: None   Collection Time: 08/04/21  7:20 PM   Specimen: Nasopharyngeal Swab; Nasopharyngeal(NP) swabs in vial transport medium  Result Value Ref Range Status   SARS Coronavirus 2 by RT PCR NEGATIVE NEGATIVE Final    Comment: (NOTE) SARS-CoV-2 target nucleic acids are NOT DETECTED.  The SARS-CoV-2 RNA is generally detectable in upper respiratory specimens during the acute phase of infection. The lowest concentration of SARS-CoV-2 viral copies this assay can detect is 138 copies/mL. A negative result does not preclude SARS-Cov-2 infection and should not be used as the sole basis for treatment or other patient management decisions. A negative result may occur with  improper specimen collection/handling, submission of specimen other than nasopharyngeal swab, presence of viral mutation(s) within the areas targeted by this assay, and inadequate number of  viral copies(<138 copies/mL). A negative result must be combined with clinical observations, patient history, and epidemiological information. The expected result is Negative.  Fact Sheet for Patients:  EntrepreneurPulse.com.au  Fact Sheet for Healthcare Providers:  IncredibleEmployment.be  This test is no t yet approved or cleared by the [place] FDA and  has been authorized for detection and/or diagnosis of SARS-CoV-2 by FDA under an Emergency Use Authorization (EUA). This EUA will remain  in effect (meaning this test can be used) for the duration of the COVID-19 declaration under Section 564(b)(1) of the Act, 21 U.S.C.section 360bbb-3(b)(1), unless the authorization is terminated  or revoked sooner.       Influenza A by PCR NEGATIVE NEGATIVE Final   Influenza B by PCR NEGATIVE NEGATIVE Final    Comment: (NOTE) The Xpert Xpress SARS-CoV-2/FLU/RSV plus assay is intended as an aid in the diagnosis of influenza from Nasopharyngeal swab specimens and should not be used as a sole basis for treatment. Nasal washings and aspirates are unacceptable for Xpert Xpress SARS-CoV-2/FLU/RSV testing.  Fact Sheet for Patients: EntrepreneurPulse.com.au  Fact Sheet for Healthcare Providers: IncredibleEmployment.be  This test is not yet approved or cleared by the [place] FDA and has been authorized for detection and/or diagnosis of SARS-CoV-2 by FDA under an Emergency Use Authorization (EUA). This EUA will remain in effect (meaning this test can be used) for the duration of the COVID-19 declaration under Section 564(b)(1) of the Act, 21 U.S.C. section 360bbb-3(b)(1), unless the authorization is terminated or revoked.  Performed at [facility], [place], [place] [address]      Medications:    aspirin EC  81 mg Oral Daily   enoxaparin (LOVENOX) injection  40 mg  Subcutaneous QHS   levothyroxine  137 mcg Oral QAC breakfast   nicotine  14 mg Transdermal Daily   Continuous Infusions:    LOS: 0 days   Charlynne Cousins  [facility]  08/05/2021, 8:35 AM

## 2021-08-05 NOTE — Progress Notes (Signed)
Pt stated she needs pain med for chronic back pain 8/10.

## 2021-08-05 NOTE — Progress Notes (Signed)
    Cardiology asked to evaluate patient. Chart reviewed by Dr. Audie Box with recommendations for coronary CTA. Orders placed, bedside RN updated on plan. Briefly spoke with patient regarding plan who voiced understanding. Further recommendations pending coronary CTA results.   SignedReino Bellis, NP-C 08/05/2021, 3:42 PM

## 2021-08-05 NOTE — Progress Notes (Signed)
  Echocardiogram 2D Echocardiogram has been performed.  Fidel Levy 08/05/2021, 10:33 AM

## 2021-08-05 NOTE — Progress Notes (Signed)
Patient ID: Emma Wilson, female   DOB: November 02, 1956, 65 y.o.   MRN: [id number] Preliminary Cardiac CT  See report in Fluency Has moderate appearing mixed plaque in mid LAD and circumflex Study sent for FFR CT which will not be available until tomorrow Patient would benefit from intensive statin Rx  Jenkins Rouge MD [facility]

## 2021-08-05 NOTE — Plan of Care (Signed)
  Problem: Education: Goal: Knowledge of General Education information will improve Description: Including pain rating scale, medication(s)/side effects and non-pharmacologic comfort measures Reactivated   

## 2021-08-06 ENCOUNTER — Encounter (HOSPITAL_COMMUNITY)
Admission: EM | Disposition: A | Discharge status: Home / Self Care | Payer: Self-pay | Source: Home / Self Care | Attending: Emergency Medicine

## 2021-08-06 ENCOUNTER — Telehealth: Payer: Self-pay | Admitting: Cardiology

## 2021-08-06 DIAGNOSIS — I200 Unstable angina: Secondary | ICD-10-CM

## 2021-08-06 DIAGNOSIS — E890 Postprocedural hypothyroidism: Secondary | ICD-10-CM

## 2021-08-06 DIAGNOSIS — I25110 Atherosclerotic heart disease of native coronary artery with unstable angina pectoris: Principal | ICD-10-CM

## 2021-08-06 DIAGNOSIS — E039 Hypothyroidism, unspecified: Secondary | ICD-10-CM | POA: Diagnosis not present

## 2021-08-06 DIAGNOSIS — Z20822 Contact with and (suspected) exposure to covid-19: Secondary | ICD-10-CM | POA: Diagnosis not present

## 2021-08-06 DIAGNOSIS — I2510 Atherosclerotic heart disease of native coronary artery without angina pectoris: Secondary | ICD-10-CM | POA: Diagnosis not present

## 2021-08-06 DIAGNOSIS — J449 Chronic obstructive pulmonary disease, unspecified: Secondary | ICD-10-CM | POA: Diagnosis not present

## 2021-08-06 DIAGNOSIS — R079 Chest pain, unspecified: Secondary | ICD-10-CM | POA: Diagnosis not present

## 2021-08-06 DIAGNOSIS — R931 Abnormal findings on diagnostic imaging of heart and coronary circulation: Secondary | ICD-10-CM

## 2021-08-06 HISTORY — PX: LEFT HEART CATH AND CORONARY ANGIOGRAPHY: CATH118249

## 2021-08-06 HISTORY — PX: CORONARY BALLOON ANGIOPLASTY: CATH118233

## 2021-08-06 HISTORY — PX: INTRAVASCULAR PRESSURE WIRE/FFR STUDY: CATH118243

## 2021-08-06 HISTORY — PX: CORONARY STENT INTERVENTION: CATH118234

## 2021-08-06 LAB — POCT ACTIVATED CLOTTING TIME
Activated Clotting Time: 277 seconds
Activated Clotting Time: 329 seconds

## 2021-08-06 SURGERY — LEFT HEART CATH AND CORONARY ANGIOGRAPHY
Anesthesia: LOCAL

## 2021-08-06 MED ORDER — FENTANYL CITRATE (PF) 100 MCG/2ML IJ SOLN
INTRAMUSCULAR | Status: AC
  Filled 2021-08-06: qty 2

## 2021-08-06 MED ORDER — FENTANYL CITRATE (PF) 100 MCG/2ML IJ SOLN
INTRAMUSCULAR | Status: DC | PRN
  Administered 2021-08-06 (×4): 25 ug via INTRAVENOUS

## 2021-08-06 MED ORDER — VERAPAMIL HCL 2.5 MG/ML IV SOLN
INTRAVENOUS | Status: DC | PRN
  Administered 2021-08-06: 10 mL via INTRA_ARTERIAL

## 2021-08-06 MED ORDER — HEPARIN SODIUM (PORCINE) 1000 UNIT/ML IJ SOLN
INTRAMUSCULAR | Status: DC | PRN
  Administered 2021-08-06: 5000 [IU] via INTRAVENOUS
  Administered 2021-08-06: 2000 [IU] via INTRAVENOUS

## 2021-08-06 MED ORDER — LABETALOL HCL 5 MG/ML IV SOLN: 10.0000 mg | INTRAVENOUS | Status: AC | PRN

## 2021-08-06 MED ORDER — HEPARIN SODIUM (PORCINE) 1000 UNIT/ML IJ SOLN
INTRAMUSCULAR | Status: AC
  Filled 2021-08-06: qty 1

## 2021-08-06 MED ORDER — ADENOSINE (DIAGNOSTIC) 140MCG/KG/MIN
INTRAVENOUS | Status: DC | PRN
  Administered 2021-08-06: 140 ug/kg/min via INTRAVENOUS

## 2021-08-06 MED ORDER — SODIUM CHLORIDE 0.9 % IV SOLN: 250.0000 mL | INTRAVENOUS | Status: DC | PRN

## 2021-08-06 MED ORDER — SODIUM CHLORIDE 0.9 % WEIGHT BASED INFUSION: 1.0000 mL/kg/h | INTRAVENOUS | Status: DC

## 2021-08-06 MED ORDER — ADENOSINE 12 MG/4ML IV SOLN
INTRAVENOUS | Status: AC
  Filled 2021-08-06: qty 16

## 2021-08-06 MED ORDER — SODIUM CHLORIDE 0.9 % WEIGHT BASED INFUSION: 1.0000 mL/kg/h | INTRAVENOUS | Status: AC

## 2021-08-06 MED ORDER — SODIUM CHLORIDE 0.9% FLUSH
3.0000 mL | Freq: Two times a day (BID) | INTRAVENOUS | Status: DC
  Administered 2021-08-06: 3 mL via INTRAVENOUS

## 2021-08-06 MED ORDER — SODIUM CHLORIDE 0.9% FLUSH: 3.0000 mL | Freq: Two times a day (BID) | INTRAVENOUS | Status: DC

## 2021-08-06 MED ORDER — ASPIRIN 81 MG PO CHEW
81.0000 mg | CHEWABLE_TABLET | ORAL | Status: AC
  Administered 2021-08-06: 81 mg via ORAL
  Filled 2021-08-06: qty 1

## 2021-08-06 MED ORDER — MIDAZOLAM HCL 2 MG/2ML IJ SOLN
INTRAMUSCULAR | Status: DC | PRN
  Administered 2021-08-06 (×2): .5 mg via INTRAVENOUS
  Administered 2021-08-06: 1 mg via INTRAVENOUS

## 2021-08-06 MED ORDER — HYDRALAZINE HCL 20 MG/ML IJ SOLN: 10.0000 mg | INTRAMUSCULAR | Status: AC | PRN

## 2021-08-06 MED ORDER — NITROGLYCERIN 1 MG/10 ML FOR IR/CATH LAB
INTRA_ARTERIAL | Status: AC
  Filled 2021-08-06: qty 10

## 2021-08-06 MED ORDER — ATORVASTATIN CALCIUM 80 MG PO TABS
80.0000 mg | ORAL_TABLET | Freq: Every day | ORAL | Status: DC
  Administered 2021-08-06 – 2021-08-07 (×2): 80 mg via ORAL
  Filled 2021-08-06 (×2): qty 1

## 2021-08-06 MED ORDER — IOHEXOL 350 MG/ML SOLN
INTRAVENOUS | Status: DC | PRN
  Administered 2021-08-06: 120 mL

## 2021-08-06 MED ORDER — NITROGLYCERIN 1 MG/10 ML FOR IR/CATH LAB
INTRA_ARTERIAL | Status: DC | PRN
  Administered 2021-08-06 (×2): 200 ug via INTRACORONARY

## 2021-08-06 MED ORDER — SODIUM CHLORIDE 0.9% FLUSH: 3.0000 mL | INTRAVENOUS | Status: DC | PRN

## 2021-08-06 MED ORDER — TICAGRELOR 90 MG PO TABS
90.0000 mg | ORAL_TABLET | Freq: Two times a day (BID) | ORAL | Status: DC
  Administered 2021-08-06 – 2021-08-07 (×2): 90 mg via ORAL
  Filled 2021-08-06 (×2): qty 1

## 2021-08-06 MED ORDER — SODIUM CHLORIDE 0.9 % WEIGHT BASED INFUSION
3.0000 mL/kg/h | INTRAVENOUS | Status: DC
  Administered 2021-08-06: 3 mL/kg/h via INTRAVENOUS

## 2021-08-06 MED ORDER — TICAGRELOR 90 MG PO TABS
ORAL_TABLET | ORAL | Status: AC
  Filled 2021-08-06: qty 2

## 2021-08-06 MED ORDER — LIDOCAINE HCL (PF) 1 % IJ SOLN
INTRAMUSCULAR | Status: DC | PRN
  Administered 2021-08-06: 2 mL

## 2021-08-06 MED ORDER — TICAGRELOR 90 MG PO TABS
ORAL_TABLET | ORAL | Status: DC | PRN
  Administered 2021-08-06: 180 mg via ORAL

## 2021-08-06 MED ORDER — HEPARIN (PORCINE) IN NACL 2000-0.9 UNIT/L-% IV SOLN
INTRAVENOUS | Status: DC | PRN
  Administered 2021-08-06: 1000 mL

## 2021-08-06 MED ORDER — MIDAZOLAM HCL 2 MG/2ML IJ SOLN
INTRAMUSCULAR | Status: AC
  Filled 2021-08-06: qty 2

## 2021-08-06 SURGICAL SUPPLY — 29 items
BALLN SAPPHIRE 2.0X12 (BALLOONS) ×2
BALLN SAPPHIRE 2.5X15 (BALLOONS) ×2
BALLN SAPPHIRE ~~LOC~~ 3.0X8 (BALLOONS) ×1 IMPLANT
BALLN ~~LOC~~ EMERGE MR 2.5X8 (BALLOONS) ×2
BALLN ~~LOC~~ EUPHORA RX 2.5X8 (BALLOONS) ×2
BALLOON SAPPHIRE 2.0X12 (BALLOONS) IMPLANT
BALLOON SAPPHIRE 2.5X15 (BALLOONS) IMPLANT
BALLOON ~~LOC~~ EMERGE MR 2.5X8 (BALLOONS) IMPLANT
BALLOON ~~LOC~~ EUPHORA RX 2.5X8 (BALLOONS) IMPLANT
CATH INFINITI JR4 5F (CATHETERS) ×1 IMPLANT
CATH LAUNCHER 5F EBU3.5 (CATHETERS) ×1 IMPLANT
DEVICE RAD TR BAND REGULAR (VASCULAR PRODUCTS) ×1 IMPLANT
GLIDESHEATH SLEND SS 6F .021 (SHEATH) ×1 IMPLANT
GUIDEWIRE INQWIRE 1.5J.035X260 (WIRE) IMPLANT
GUIDEWIRE PRESSURE X 175 (WIRE) ×1 IMPLANT
INQWIRE 1.5J .035X260CM (WIRE) ×2
KIT ENCORE 26 ADVANTAGE (KITS) ×2 IMPLANT
KIT HEART LEFT (KITS) ×2 IMPLANT
PACK CARDIAC CATHETERIZATION (CUSTOM PROCEDURE TRAY) ×2 IMPLANT
SHEATH PROBE COVER 6X72 (BAG) ×1 IMPLANT
STENT SYNERGY XD 2.75X20 (Permanent Stent) IMPLANT
STENT SYNERGY XD 3.0X12 (Permanent Stent) IMPLANT
SYNERGY XD 2.75X20 (Permanent Stent) ×2 IMPLANT
SYNERGY XD 3.0X12 (Permanent Stent) ×2 IMPLANT
SYR MEDRAD MARK 7 150ML (SYRINGE) ×2 IMPLANT
TRANSDUCER W/STOPCOCK (MISCELLANEOUS) ×2 IMPLANT
TUBING CIL FLEX 10 FLL-RA (TUBING) ×2 IMPLANT
WIRE ASAHI PROWATER 180CM (WIRE) ×1 IMPLANT
WIRE HI TORQ BMW 190CM (WIRE) ×2 IMPLANT

## 2021-08-06 NOTE — Progress Notes (Signed)
[facility] PROGRESS NOTE    Progress Note  Emma Wilson  LDJ:[id number] DOB: 08/26/1956 DOA: 08/04/2021 PCP: Merrilee Seashore, MD     Brief Narrative:   Emma Wilson is an 65 y.o. female past medical history significant for COPD, hyperlipidemia hypothyroidism who was seen by her PCP on 08/03/2021 for chest pain that have been going on for 4 days prior to her visit to her PCP, came into the ED on 08/04/2021 for chest pain, she is currently chest pain-free  Assessment/Plan:   Atypical Chest pain: Cardiac biomarkers negative x3 twelve-lead EKG showed no ischemic changes, coronary CT showed moderate appearing plaque in the mid LAD and circumflex. Has agreed to cardiac cath on 08/06/2021 2D echo was done that showed an EF of 55% with grade 1 diastolic heart failure no regional wall motion abnormalities.  COPD: Noted stable continue inhalers.  Hyperlipidemia, Continue statins.  Hypothyroidism: Continue Synthroid.  Tobacco abuse: Ongoing she has been counseled.   DVT prophylaxis: lovenox Family Communication:noen Status is: Observation  The patient remains OBS appropriate and will d/c before 2 midnights.  Dispo: The patient is from: Home              Anticipated d/c is to: Home              Patient currently is not medically stable to d/c.   Difficult to place patient No        Code Status:     Code Status Orders  (From admission, onward)           Start     Ordered   08/05/21 0108  Full code  Continuous        08/05/21 0109           Code Status History     Date Active Date Inactive Code Status Order ID Comments User Context   12/05/2013 1935 12/06/2013 2116 Full Code [id number]  Elmarie Shiley, MD Inpatient         IV Access:   Peripheral IV   Procedures and diagnostic studies:   DG Chest 2 View  Result Date: 08/04/2021 CLINICAL DATA:  Chest pain beginning several days ago. EXAM: CHEST - 2 VIEW COMPARISON:  04/23/2020  FINDINGS: Heart size is normal. Aortic atherosclerotic calcification. 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 No sign of active infiltrate, mass, effusion or collapse. No acute bone finding. Surgical clips at the thyroid region. IMPRESSION: No active disease. Background emphysema and pulmonary scarring. Aortic atherosclerotic calcification. Electronically Signed   By: Nelson Chimes M.D.   On: 08/04/2021 16:03   CT Angio Chest PE W and/or Wo Contrast  Result Date: 08/04/2021 CLINICAL DATA:  Central chest pain x5 days. EXAM: CT ANGIOGRAPHY CHEST WITH CONTRAST TECHNIQUE: Multidetector CT imaging of the chest was performed using the standard protocol during bolus administration of intravenous contrast. Multiplanar CT image reconstructions and MIPs were obtained to evaluate the vascular anatomy. CONTRAST:  55mL OMNIPAQUE IOHEXOL 350 MG/ML SOLN COMPARISON:  None. FINDINGS: Cardiovascular: There is mild calcification of the aortic arch without evidence of aortic aneurysm. Satisfactory opacification of the pulmonary arteries to the segmental level. No evidence of pulmonary embolism. Normal heart size with mild to moderate severity coronary artery calcification. No pericardial effusion. Mediastinum/Nodes: No enlarged mediastinal, hilar, or axillary lymph nodes. Thyroid gland is surgically absent. The trachea and esophagus demonstrate no significant findings. Lungs/Pleura: Moderate to marked severity emphysematous lung disease is seen involving predominantly the bilateral upper lobes. Mild  areas of linear scarring and/or atelectasis are seen within the bilateral lower lobes. There is no evidence of a pleural effusion or pneumothorax. Upper Abdomen: No acute abnormality. Musculoskeletal: No chest wall abnormality. No acute or significant osseous findings. Review of the MIP images confirms the above findings. IMPRESSION: 1. No evidence of pulmonary embolism. 2. Moderate to marked severity emphysematous lung disease.  3. Mild bilateral lower lobe linear scarring and/or atelectasis. Aortic Atherosclerosis (ICD10-I70.0) and Emphysema (ICD10-J43.9). Electronically Signed   By: Virgina Norfolk M.D.   On: 08/04/2021 22:06   CT CORONARY MORPH W/CTA COR W/SCORE W/CA W/CM &/OR WO/CM  Addendum Date: 08/06/2021   ADDENDUM REPORT: 08/06/2021 08:24 EXAM: OVER-READ INTERPRETATION  CT CHEST The following report is an over-read performed by radiologist Dr. Rebekah Chesterfield [facility] on 08/06/2021. This over-read does not include interpretation of cardiac or coronary anatomy or pathology. The coronary calcium score and cardiac CTA interpretation by the cardiologist is attached. COMPARISON:  Chest CTA 08/04/2021. FINDINGS: Aortic atherosclerosis. Diffuse bronchial wall thickening with moderate centrilobular and paraseptal emphysema. Extensive areas of the linear architectural distortion and volume loss in the lower lobes of the lungs bilaterally, likely reflect areas of chronic post infectious or inflammatory scarring. Within the visualized portions of the thorax there are no suspicious appearing pulmonary nodules or masses, there is no acute consolidative airspace disease, no pleural effusions, no pneumothorax and no lymphadenopathy. Visualized portions of the upper abdomen are unremarkable. There are no aggressive appearing lytic or blastic lesions noted in the visualized portions of the skeleton. IMPRESSION: 1. Extensive post infectious or inflammatory scarring in the lower lobes of the lungs bilaterally. 2. Mild diffuse bronchial wall thickening with moderate centrilobular and paraseptal emphysema; imaging findings suggestive of underlying COPD. 3. Aortic atherosclerosis. Aortic Atherosclerosis (ICD10-I70.0) and Emphysema (ICD10-J43.9). Electronically Signed   By: Vinnie Langton M.D.   On: 08/06/2021 08:24   Addendum Date: 08/06/2021   ADDENDUM REPORT: 08/06/2021 07:47 Electronically Signed   By: Jenkins Rouge M.D.    On: 08/06/2021 07:47   Result Date: 08/06/2021 CLINICAL DATA:  Chest pain EXAM: Cardiac CTA MEDICATIONS: Sub lingual nitro.  4mg  TECHNIQUE: The patient was scanned on a Siemens Force 093 slice scanner. Gantry rotation speed was 250 msecs. Collimation was .6 mm. A 100 kV prospective scan was triggered in the ascending thoracic aorta at 140 HU's Full mA was used between 35% and 75% of the R-R interval. Average HR during the scan was 61 bpm. The 3D data set was interpreted on a dedicated work station using MPR, MIP and VRT modes. A total of 80 cc of contrast was used. FINDINGS: Non-cardiac: See separate report from [facility]. No significant findings on limited lung and soft tissue windows. Calcium Score: 3 vessel calcium noted Coronary Arteries: Right dominant with no anomalies LM: Normal LAD: 50-69% mixed plaque in mid vessel just before D2 take off D1: Normal D2: 25-49% mixed plaque in proximal vessel Circumflex: 25-49% mixed plaque mid circumflex at bifurcation of AV groove and OM1 RCA: Very tortuous with motion artifact in mid vessel 1-24% calcified plaque in proximal and distal vessel PDA: Normal PLA: Normal IMPRESSION: 1. Calcium score 72.8 which is 42 st percentile for age and sex 2.  Normal ascending thoracic aorta 3.1 cm 3. CAD RADS 3 CAD. Given mixed plaque nature of disease study will be sent for [facility] CT Patient would benefit from intense statin Rx Jenkins Rouge Electronically Signed: By: Jenkins Rouge M.D. On: 08/06/2021 07:36  CT CORONARY FRACTIONAL FLOW RESERVE FLUID ANALYSIS  Result Date: 08/06/2021 CLINICAL DATA:  CAD EXAM: FFR CT TECHNIQUE: The best systolic and diastolic phases of the patients gated cardiac CTA sent to [facility] for hemodynamic analysis . FINDINGS: FFR CT positive in mid circumflex at OM1 take off 0.59 Values normal elsewhere particularly mid LAD/D1 0.88 IMPRESSION: Positive FFR CT 0.59 in mid circumflex with mixed plaque In setting of acute chest pain syndrome Patient  should be referred for cardiac catheterization Jenkins Rouge Electronically Signed   By: Jenkins Rouge M.D.   On: 08/06/2021 07:46   ECHOCARDIOGRAM COMPLETE  Result Date: 08/05/2021    ECHOCARDIOGRAM REPORT   Patient Name:   Emma Wilson Date of Exam: 08/05/2021 Medical Rec #:  [id number]        Height:       67.0 in Accession #:    [id number]       Weight:       124.6 lb Date of Birth:  1956/02/05       BSA:          1.654 m Patient Age:    63 years         BP:           109/76 mmHg Patient Gender: F                HR:           49 bpm. Exam Location:  Inpatient Procedure: 2D Echo, Cardiac Doppler and Color Doppler Indications:    R07.9* Chest pain, unspecified  History:        Patient has prior history of Echocardiogram examinations, most                 recent 12/06/2013. COPD, Arrythmias:Bradycardia; Risk                 Factors:Dyslipidemia.  Sonographer:    Bernadene Person RDCS Referring Phys: [id number] [place]  1. Left ventricular ejection fraction, by estimation, is 55 to 60%. The left ventricle has normal function. The left ventricle has no regional wall motion abnormalities. Left ventricular diastolic parameters are consistent with Grade I diastolic dysfunction (impaired relaxation).  2. Right ventricular systolic function is normal. The right ventricular size is normal. There is normal pulmonary artery systolic pressure. The estimated right ventricular systolic pressure is 44.8 mmHg.  3. The mitral valve is grossly normal. No evidence of mitral valve regurgitation. No evidence of mitral stenosis.  4. The aortic valve is tricuspid. There is mild calcification of the aortic valve. Aortic valve regurgitation is not visualized. No aortic stenosis is present.  5. The inferior vena cava is normal in size with greater than 50% respiratory variability, suggesting right atrial pressure of 3 mmHg.  6. Cannot exclude small patent foramen ovale. Redundant atrial septum. FINDINGS  Left Ventricle:  Left ventricular ejection fraction, by estimation, is 55 to 60%. The left ventricle has normal function. The left ventricle has no regional wall motion abnormalities. The left ventricular internal cavity size was normal in size. There is  no left ventricular hypertrophy. Left ventricular diastolic parameters are consistent with Grade I diastolic dysfunction (impaired relaxation). Right Ventricle: The right ventricular size is normal. No increase in right ventricular wall thickness. Right ventricular systolic function is normal. There is normal pulmonary artery systolic pressure. The tricuspid regurgitant velocity is 2.15 m/s, and  with an assumed right atrial pressure of 3 mmHg, the estimated right ventricular systolic pressure  is 21.5 mmHg. Left Atrium: Left atrial size was normal in size. Right Atrium: Right atrial size was normal in size. Pericardium: There is no evidence of pericardial effusion. Mitral Valve: The mitral valve is grossly normal. Mild mitral annular calcification. No evidence of mitral valve regurgitation. No evidence of mitral valve stenosis. Tricuspid Valve: The tricuspid valve is normal in structure. Tricuspid valve regurgitation is mild . No evidence of tricuspid stenosis. Aortic Valve: The aortic valve is tricuspid. There is mild calcification of the aortic valve. Aortic valve regurgitation is not visualized. No aortic stenosis is present. Pulmonic Valve: The pulmonic valve was not well visualized. Pulmonic valve regurgitation is trivial. No evidence of pulmonic stenosis. Aorta: The aortic root is normal in size and structure. Venous: The inferior vena cava is normal in size with greater than 50% respiratory variability, suggesting right atrial pressure of 3 mmHg. IAS/Shunts: There is redundancy of the interatrial septum. Cannot exclude small patent foramen ovale. Redundant atrial septum.  LEFT VENTRICLE PLAX 2D LVIDd:         4.10 cm  Diastology LVIDs:         2.80 cm  LV e' medial:    4.81  cm/s LV PW:         0.80 cm  LV E/e' medial:  10.0 LV IVS:        0.80 cm  LV e' lateral:   8.63 cm/s LVOT diam:     2.20 cm  LV E/e' lateral: 5.6 LV SV:         75 LV SV Index:   45 LVOT Area:     3.80 cm  RIGHT VENTRICLE RV S prime:     9.60 cm/s TAPSE (M-mode): 1.9 cm LEFT ATRIUM             Index       RIGHT ATRIUM           Index LA diam:        2.50 cm 1.51 cm/m  RA Area:     11.00 cm LA Vol (A2C):   39.6 ml 23.95 ml/m RA Volume:   25.00 ml  15.12 ml/m LA Vol (A4C):   38.2 ml 23.10 ml/m LA Biplane Vol: 39.6 ml 23.95 ml/m  AORTIC VALVE LVOT Vmax:   84.00 cm/s LVOT Vmean:  50.600 cm/s LVOT VTI:    0.196 m  AORTA Ao Root diam: 3.30 cm Ao Asc diam:  3.10 cm MITRAL VALVE               TRICUSPID VALVE MV Area (PHT): 2.50 cm    TR Peak grad:   18.5 mmHg MV Decel Time: 303 msec    TR Vmax:        215.00 cm/s MV E velocity: 48.30 cm/s MV A velocity: 74.40 cm/s  SHUNTS MV E/A ratio:  0.65        Systemic VTI:  0.20 m                            Systemic Diam: 2.20 cm Cherlynn Kaiser MD Electronically signed by Cherlynn Kaiser MD Signature Date/Time: 08/05/2021/11:58:29 AM    Final      Medical Consultants:   None.   Subjective:    [name] denies any chest pain or shortness of breath.  Objective:    Vitals:   08/05/21 1607 08/05/21 2011 08/06/21 0613 08/06/21 0806  BP: 120/81 125/67 125/82 126/90  Pulse: 61 60 (!) 57 (!) 53  Resp: 18 16 18 18   Temp: 98.6 F (37 C) 98.3 F (36.8 C) 97.7 F (36.5 C) 98.1 F (36.7 C)  TempSrc: Oral Oral Oral Oral  SpO2:  94% 94% 94%  Weight:      Height:       SpO2: 94 %   Intake/Output Summary (Last 24 hours) at 08/06/2021 0940 Last data filed at 08/05/2021 2135 Gross per 24 hour  Intake 350 ml  Output --  Net 350 ml   Filed Weights   08/04/21 1512 08/05/21 0049  Weight: 58.1 kg 56.5 kg    Exam: General exam: In no acute distress. Respiratory system: Good air movement and clear to auscultation. Cardiovascular system: S1 & S2  heard, RRR. No JVD. Gastrointestinal system: Abdomen is nondistended, soft and nontender.  Extremities: No pedal edema. Skin: No rashes, lesions or ulcers Psychiatry: Judgement and insight appear normal. Mood & affect appropriate. Data Reviewed:    Labs: Basic Metabolic Panel: Recent Labs  Lab 08/04/21 1530 08/05/21 0218  NA 139 136  K 3.8 3.6  CL 102 103  CO2 27 24  GLUCOSE 87 86  BUN 14 10  CREATININE 0.78 0.74  CALCIUM 9.7 9.0    GFR Estimated Creatinine Clearance: 64.2 mL/min (by C-G formula based on SCr of 0.74 mg/dL). Liver Function Tests: No results for input(s): AST, ALT, ALKPHOS, BILITOT, PROT, ALBUMIN in the last 168 hours. No results for input(s): LIPASE, AMYLASE in the last 168 hours. No results for input(s): AMMONIA in the last 168 hours. Coagulation profile No results for input(s): INR, PROTIME in the last 168 hours. COVID-19 Labs  Recent Labs    08/04/21 1920  DDIMER 0.86*     Lab Results  Component Value Date   SARSCOV2NAA NEGATIVE 08/04/2021    CBC: Recent Labs  Lab 08/04/21 1530 08/05/21 0218  WBC 3.8* 3.6*  HGB 12.5 12.1  HCT 36.3 34.6*  MCV 81.4 80.7  PLT 280 251    Cardiac Enzymes: No results for input(s): CKTOTAL, CKMB, CKMBINDEX, TROPONINI in the last 168 hours. BNP (last 3 results) No results for input(s): PROBNP in the last 8760 hours. CBG: No results for input(s): GLUCAP in the last 168 hours. D-Dimer: Recent Labs    08/04/21 1920  DDIMER 0.86*    Hgb A1c: No results for input(s): HGBA1C in the last 72 hours. Lipid Profile: Recent Labs    08/05/21 0218  CHOL 202*  HDL 45  LDLCALC 138*  TRIG 93  CHOLHDL 4.5    Thyroid function studies: No results for input(s): TSH, T4TOTAL, T3FREE, THYROIDAB in the last 72 hours.  Invalid input(s): FREET3 Anemia work up: No results for input(s): VITAMINB12, FOLATE, FERRITIN, TIBC, IRON, RETICCTPCT in the last 72 hours. Sepsis Labs: Recent Labs  Lab 08/04/21 1530  08/05/21 0218  WBC 3.8* 3.6*    Microbiology Recent Results (from the past 240 hour(s))  Resp Panel by RT-PCR (Flu A&B, Covid) Nasopharyngeal Swab     Status: None   Collection Time: 08/04/21  7:20 PM   Specimen: Nasopharyngeal Swab; Nasopharyngeal(NP) swabs in vial transport medium  Result Value Ref Range Status   SARS Coronavirus 2 by RT PCR NEGATIVE NEGATIVE Final    Comment: (NOTE) SARS-CoV-2 target nucleic acids are NOT DETECTED.  The SARS-CoV-2 RNA is generally detectable in upper respiratory specimens during the acute phase of infection. The lowest concentration of SARS-CoV-2 viral copies this assay can detect is 138 copies/mL. A  negative result does not preclude SARS-Cov-2 infection and should not be used as the sole basis for treatment or other patient management decisions. A negative result may occur with  improper specimen collection/handling, submission of specimen other than nasopharyngeal swab, presence of viral mutation(s) within the areas targeted by this assay, and inadequate number of viral copies(<138 copies/mL). A negative result must be combined with clinical observations, patient history, and epidemiological information. The expected result is Negative.  Fact Sheet for Patients:  EntrepreneurPulse.com.au  Fact Sheet for Healthcare Providers:  IncredibleEmployment.be  This test is no t yet approved or cleared by the [place] FDA and  has been authorized for detection and/or diagnosis of SARS-CoV-2 by FDA under an Emergency Use Authorization (EUA). This EUA will remain  in effect (meaning this test can be used) for the duration of the COVID-19 declaration under Section 564(b)(1) of the Act, 21 U.S.C.section 360bbb-3(b)(1), unless the authorization is terminated  or revoked sooner.       Influenza A by PCR NEGATIVE NEGATIVE Final   Influenza B by PCR NEGATIVE NEGATIVE Final    Comment: (NOTE) The Xpert Xpress  SARS-CoV-2/FLU/RSV plus assay is intended as an aid in the diagnosis of influenza from Nasopharyngeal swab specimens and should not be used as a sole basis for treatment. Nasal washings and aspirates are unacceptable for Xpert Xpress SARS-CoV-2/FLU/RSV testing.  Fact Sheet for Patients: EntrepreneurPulse.com.au  Fact Sheet for Healthcare Providers: IncredibleEmployment.be  This test is not yet approved or cleared by the [place] FDA and has been authorized for detection and/or diagnosis of SARS-CoV-2 by FDA under an Emergency Use Authorization (EUA). This EUA will remain in effect (meaning this test can be used) for the duration of the COVID-19 declaration under Section 564(b)(1) of the Act, 21 U.S.C. section 360bbb-3(b)(1), unless the authorization is terminated or revoked.  Performed at [facility], [place], [place] [address]      Medications:    aspirin  81 mg Oral Pre-Cath   aspirin EC  81 mg Oral Daily   atorvastatin  80 mg Oral Daily   enoxaparin (LOVENOX) injection  40 mg Subcutaneous QHS   levothyroxine  137 mcg Oral QAC breakfast   nicotine  14 mg Transdermal Daily   sodium chloride flush  3 mL Intravenous Q12H   Continuous Infusions:  sodium chloride     sodium chloride     Followed by   sodium chloride        LOS: 0 days   Charlynne Cousins  [facility]  08/06/2021, 9:40 AM

## 2021-08-06 NOTE — Telephone Encounter (Signed)
Patient requesting a call back. She is currently in the hospital and would like to speak with someone in the office about her car. Room phone: [phone], mobile: [phone]

## 2021-08-06 NOTE — Telephone Encounter (Signed)
Spoke to patient she stated Dr.O'Neal saw her in hospital this morning.She was calling to make sure he works with [employer].He recommended a heart cath and she wanted to make sure Dr.Crenshaw agreed.Advised Dr.O'Neal is one of Dr.Crenshaw's partners.I will make Dr.Crenshaw aware.

## 2021-08-06 NOTE — Progress Notes (Signed)
  Shared Decision Making/Informed Consent  The risks [stroke (1 in 1000), death (1 in 1000), kidney failure [usually temporary] (1 in 500), bleeding (1 in 200), allergic reaction [possibly serious] (1 in 200)], benefits (diagnostic support and management of coronary artery disease) and alternatives of a cardiac catheterization were discussed in detail with Ms. Emma Wilson and she is willing to proceed.  Patient called Dr Stanford Breed office and spoke to Dr Audie Box again this morning, after above discussion, is agreeable with cardiac cath now. She is added on cath board today. NPO. Pre-cath orders placed.

## 2021-08-06 NOTE — Interval H&P Note (Signed)
History and Physical Interval Note:  08/06/2021 1:47 PM  Emma Wilson  has presented today for surgery, with the diagnosis of stable angina - abnormal cardiac ct angiogram.  The various methods of treatment have been discussed with the patient and family. After consideration of risks, benefits and other options for treatment, the patient has consented to  Procedure(s): LEFT HEART CATH AND CORONARY ANGIOGRAPHY (N/A)  PERCUTANEOUS CORONARY INTERVENTION  as a surgical intervention.  The patient's history has been reviewed, patient examined, no change in status, stable for surgery.  I have reviewed the patient's chart and labs.  Questions were answered to the patient's satisfaction.    Cath Lab Visit (complete for each Cath Lab visit)  Clinical Evaluation Leading to the Procedure:   ACS: Yes.    Non-ACS:    Anginal Classification: CCS III  Anti-ischemic medical therapy: Minimal Therapy (1 class of medications)  Non-Invasive Test Results: High-risk stress test findings: cardiac mortality >3%/year; Abnormal Cardiac CT Angiography  Prior CABG: No previous CABG   Glenetta Hew

## 2021-08-06 NOTE — Consult Note (Addendum)
Cardiology Consultation:   Patient ID: Emma Wilson MRN: [id number]; DOB: Apr 13, 1956  Admit date: 08/04/2021 Date of Consult: 08/06/2021  PCP:  Emma Seashore, MD   [facility] Providers Cardiologist:  Emma Ruths, MD   {     Patient Profile:   Emma Wilson is a 65 y.o. female with a hx of tobacco abuse, HLD, COPD, hypothyroidism, thyroid cancer, who is being seen 08/06/2021 for the evaluation of chest pain and abnormal CTA at the request of Dr. Venetia Wilson.  History of Present Illness:   [name] follows Dr Emma Wilson outpatient, had hx of chest pain in the past. Myoview from 8/ 2011 revealed EF of 53 and no ischemia. Echo from 1/ 2015 showed normal LV function and grade 1 DD. Exercise treadmill 3/ 2015 normal. ABIs 9/17 normal.  She had a syncopal episode 4/ 2018.  Event monitor 5/18 showed sinus with PVC.   She had visited cardiology office on 08/03/21 for 4 days onset of chest pain, thought was indigestion, drinking coke did not help. Pain was located on the lower substernal area, pressure like, intermittent occurring since Friday. She takes ASA $RemoveB'81mg'mczTFGUQ$  routinely. She smokes cigarette. EKG at the office showed SR, LVH,no ST segment deviation. Given concern of unstable angina, she was instructed to ER. Patient waited and went to ER on 08/04/21 afternoon, Hs Trop negative x2.  CTA chest was negative for PE, moderate to marked severity emphysematous lung disease, mild bilateral lower lobe linear scarring and/or atelectasis. CBC and BMP grossly unremarkable. Flu and COVID negative. D dimer elevated 0.86. She was admitted to hospital medicine. Subsequent CTA coronary study showed Positive FFR CT 0.59 in mid circumflex with mixed plaque. Echo from 08/05/21 showed EF 55-60%, no RMWA, grade I DD, no significant valvular disease. Cardiology is consulted today for further input.   Patient states she is chest pain free today. She was informed of her CTA coronary study results this morning. She  wishes to speak to Dr Emma Wilson directly before she consents for cardiac cath today. She expressed frustration about Emma Wilson cardiology and states she has "different expectation" in Dr Emma Wilson office.      Past Medical History:  Diagnosis Date   Abnormality of gait 03/19/2014   one leg shorter than other"uses cane frequently"   Carpal tunnel syndrome    right wrist   Chronic back pain    DDD   COPD (chronic obstructive pulmonary disease) ([place])    Depression 2006   "after my son passed" (12/05/2013)   Fibromyalgia    GERD (gastroesophageal reflux disease)    Hematuria    microscopic hematuria (urol & nephrol w/u neg), multiple UTI`s, glomerular from [facility], Emma Wilson 2014   Hyperlipidemia    managed by Dr. Ouida Wilson   Hypothyroidism    Leg length inequality    sees rheumatology   Migraine    "q 3-4 months" (12/05/2013)   Osteoarthritis    Rheumatoid and OA?  Dr. Titus Wilson ([facility], rheumatology)   Pneumonia    "abuntantly as a child"   Rheumatoid arthritis ([place])    Dr. Titus Wilson   Shortness of breath    "at rest" (12/05/2013)   Sinus bradycardia    Thyroid cancer [facility])    S/P radiation & OR    Past Surgical History:  Procedure Laterality Date   COLONOSCOPY  10/13/2010   external and internal hemorrhoids, repeat 2021; Dr. Clarene Wilson   CYSTOSCOPY  02/26/2013   Emma Wilson (normal; follicular cystitis)  EUS N/A 03/18/2016   Procedure: UPPER ENDOSCOPIC ULTRASOUND (EUS) RADIAL;  Surgeon: Emma Banister, MD;  Location: [facility] ENDOSCOPY;  Service: Endoscopy;  Laterality: N/A;   THYROIDECTOMY, PARTIAL  11/2005; 11/2006   TOTAL ABDOMINAL HYSTERECTOMY  ~ 1990   due to bleeding   TUBAL LIGATION  1980's     Home Medications:  Prior to Admission medications   Medication Sig Start Date End Date Taking? Authorizing Provider  aspirin EC 81 MG tablet Take 81 mg by mouth daily. Swallow whole.   Yes [provider]  calcium gluconate 500 MG tablet Take 500 mg by mouth daily.    Yes [provider]  diclofenac sodium (VOLTAREN) 1 % GEL Apply 1 application topically 3 (three) times daily as needed (pain).   Yes [provider]  OVER THE COUNTER MEDICATION Take 1 each by mouth daily. CBD Oil, CBD tea with tumeric and ginger, and CBD honey   Yes [provider]  OVER THE COUNTER MEDICATION Take 1 each by mouth daily. [place] herb used for nausea   Yes [provider]  Polyvinyl Alcohol-Povidone (REFRESH OP) Place 1 drop into both eyes daily as needed (dry eyes).   Yes [provider]  pregabalin (LYRICA) 50 MG capsule Take 50 mg by mouth daily.   Yes [provider]  SYNTHROID 137 MCG tablet Take 1 tablet (137 mcg total) by mouth daily before breakfast. 07/27/16  Yes Tysinger, Camelia Eng, PA-C  tiZANidine (ZANAFLEX) 4 MG tablet Take 4 mg by mouth 2 (two) times daily as needed for muscle spasms. 08/17/18  Yes [provider]  traMADol (ULTRAM) 50 MG tablet Take 50 mg by mouth 3 (three) times daily as needed for moderate pain. 07/08/21  Yes [provider]    Inpatient Medications: Scheduled Meds:  aspirin EC  81 mg Oral Daily   atorvastatin  80 mg Oral Daily   enoxaparin (LOVENOX) injection  40 mg Subcutaneous QHS   levothyroxine  137 mcg Oral QAC breakfast   nicotine  14 mg Transdermal Daily   Continuous Infusions:  PRN Meds: acetaminophen, albuterol, nitroGLYCERIN, ondansetron (ZOFRAN) IV, traMADol  Allergies:   No Known Allergies  Social History:   Social History   Socioeconomic History   Marital status: Divorced    Spouse name: Not on file   Number of children: 3   Years of education: college   Highest education level: Not on file  Occupational History   Occupation: Armed forces operational officer: [employer]  Tobacco Use   Smoking status: Every Day    Packs/day: 0.50    Years: 35.00    Pack years: 17.50    Types: Cigarettes   Smokeless tobacco: Never  Substance and Sexual Activity   Alcohol  use: Yes    Alcohol/week: 0.0 standard drinks    Comment: once to twice a week    Drug use: No   Sexual activity: Not Currently    Partners: Male  Other Topics Concern   Not on file  Social History Narrative   Lives alone.  2 living children (1 deceased), 1 in [place], 1 in [place], 6 grandchildren.  Works at [employer].   Exercise some.  As of 07/2016   Social Determinants of Health   Financial Resource Strain: Not on file  Food Insecurity: Not on file  Transportation Needs: Not on file  Physical Activity: Not on file  Stress: Not on file  Social Connections: Not on file  Intimate  Partner Violence: Not on file    Family History:    Family History  Problem Relation Age of Onset   Stroke Mother    Coronary artery disease Mother    Kidney disease Mother        on dialysis   Heart disease Mother    Cancer Father        multiple myeloma   Arthritis Sister        rheumatoid and osteoarthritis   Fibromyalgia Sister    Arthritis Sister        RA and OA   Diabetes Son      ROS:  Constitutional: Denied fever, chills, malaise, night sweats Eyes: Denied vision change or loss Ears/Nose/Mouth/Throat: Denied ear ache, sore throat, coughing, sinus pain Cardiovascular: see HPI  Respiratory: denied shortness of breath Gastrointestinal: Denied nausea, vomiting, abdominal pain, diarrhea Genital/Urinary: Denied dysuria, hematuria, urinary frequency/urgency Musculoskeletal: Denied muscle ache, joint pain, weakness Skin: Denied rash, wound Neuro: Denied headache, dizziness, syncope Psych: Denied history of depression/anxiety  Endocrine: Denied history of diabetes   Physical Exam/Data:   Vitals:   08/05/21 1607 08/05/21 2011 08/06/21 0613 08/06/21 0806  BP: 120/81 125/67 125/82 126/90  Pulse: 61 60 (!) 57 (!) 53  Resp: $Remo'18 16 18 18  'qFfVB$ Temp: 98.6 F (37 C) 98.3 F (36.8 C) 97.7 F (36.5 C) 98.1 F (36.7 C)  TempSrc: Oral Oral Oral Oral  SpO2:  94% 94% 94%  Weight:       Height:        Intake/Output Summary (Last 24 hours) at 08/06/2021 0835 Last data filed at 08/05/2021 2135 Gross per 24 hour  Intake 350 ml  Output --  Net 350 ml   Last 3 Weights 08/05/2021 08/04/2021 08/03/2021  Weight (lbs) 124 lb 9 oz 128 lb 128 lb 6.4 oz  Weight (kg) 56.5 kg 58.06 kg 58.242 kg  Some encounter information is confidential and restricted. Go to Review Flowsheets activity to see all data.     Body mass index is 19.51 kg/m.   Vitals:  Vitals:   08/06/21 0613 08/06/21 0806  BP: 125/82 126/90  Pulse: (!) 57 (!) 53  Resp: 18 18  Temp: 97.7 F (36.5 C) 98.1 F (36.7 C)  SpO2: 94% 94%   General Appearance: In no apparent distress, laying in bed HEENT: Normocephalic, atraumatic. EOMs intact.  Neck: Supple, trachea midline, no JVDs Cardiovascular: Regular rate and rhythm, normal S1-S2,  no murmur/rub/gallop, S3/S4 Respiratory: Resting breathing unlabored, lungs sounds clear to auscultation bilaterally, no use of accessory muscles. On room air.  No wheezes, rales or rhonchi.   Gastrointestinal: Bowel sounds positive, abdomen soft, non-tender, non-distended.  Extremities: Able to move all extremities in bed without difficulty, no edema/cyanosis/clubbing Genitourinary: genital exam not performed Musculoskeletal: Normal muscle bulk and tone Skin: Intact, warm, dry. No rashes or petechiae noted in exposed areas.  Neurologic: Alert, oriented to person, place and time. Fluent speech,  no cognitive deficit, no gross focal neuro deficit Psychiatric: Normal affect. Mood is appropriate.   EKG:  The EKG was personally reviewed and demonstrates:  EKG from 08/04/21 at 1619 showed SR, ventricular rate of 65 bpm, LVH, no acute ischemic changes   Telemetry:  Telemetry was personally reviewed and demonstrates:  SR /sinus bradycardia high 50s  Relevant CV Studies:  CT coronary study 08/05/21:  1. Calcium score 72.8 which is 91 st percentile for age and sex   2.  Normal ascending  thoracic aorta 3.1 cm  3. CAD RADS 3 CAD. Given mixed plaque nature of disease study will be sent for FFR CT  FFR study:  Positive FFR CT 0.59 in mid circumflex with mixed plaque In setting of acute chest pain syndrome   Echo from 08/05/21:  1. Left ventricular ejection fraction, by estimation, is 55 to 60%. The  left ventricle has normal function. The left ventricle has no regional  wall motion abnormalities. Left ventricular diastolic parameters are  consistent with Grade I diastolic  dysfunction (impaired relaxation).   2. Right ventricular systolic function is normal. The right ventricular  size is normal. There is normal pulmonary artery systolic pressure. The  estimated right ventricular systolic pressure is 32.6 mmHg.   3. The mitral valve is grossly normal. No evidence of mitral valve  regurgitation. No evidence of mitral stenosis.   4. The aortic valve is tricuspid. There is mild calcification of the  aortic valve. Aortic valve regurgitation is not visualized. No aortic  stenosis is present.   5. The inferior vena cava is normal in size with greater than 50%  respiratory variability, suggesting right atrial pressure of 3 mmHg.   6. Cannot exclude small patent foramen ovale. Redundant atrial septum   Laboratory Data:  High Sensitivity Troponin:   Recent Labs  Lab 08/04/21 1530 08/04/21 1920  TROPONINIHS <2 <2     Chemistry Recent Labs  Lab 08/04/21 1530 08/05/21 0218  NA 139 136  K 3.8 3.6  CL 102 103  CO2 27 24  GLUCOSE 87 86  BUN 14 10  CREATININE 0.78 0.74  CALCIUM 9.7 9.0  GFRNONAA >60 >60  ANIONGAP 10 9    No results for input(s): PROT, ALBUMIN, AST, ALT, ALKPHOS, BILITOT in the last 168 hours. Lipids  Recent Labs  Lab 08/05/21 0218  CHOL 202*  TRIG 93  HDL 45  LDLCALC 138*  CHOLHDL 4.5    Hematology Recent Labs  Lab 08/04/21 1530 08/05/21 0218  WBC 3.8* 3.6*  RBC 4.46 4.29  HGB 12.5 12.1  HCT 36.3 34.6*  MCV 81.4 80.7  MCH  28.0 28.2  MCHC 34.4 35.0  RDW 12.9 12.6  PLT 280 251   Thyroid No results for input(s): TSH, FREET4 in the last 168 hours.  BNPNo results for input(s): BNP, PROBNP in the last 168 hours.  DDimer  Recent Labs  Lab 08/04/21 1920  DDIMER 0.86*     Radiology/Studies:  DG Chest 2 View  Result Date: 08/04/2021 CLINICAL DATA:  Chest pain beginning several days ago. EXAM: CHEST - 2 VIEW COMPARISON:  04/23/2020 FINDINGS: Heart size is normal. Aortic atherosclerotic calcification. 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 No sign of active infiltrate, mass, effusion or collapse. No acute bone finding. Surgical clips at the thyroid region. IMPRESSION: No active disease. Background emphysema and pulmonary scarring. Aortic atherosclerotic calcification. Electronically Signed   By: Nelson Chimes M.D.   On: 08/04/2021 16:03   CT Angio Chest PE W and/or Wo Contrast  Result Date: 08/04/2021 CLINICAL DATA:  Central chest pain x5 days. EXAM: CT ANGIOGRAPHY CHEST WITH CONTRAST TECHNIQUE: Multidetector CT imaging of the chest was performed using the standard protocol during bolus administration of intravenous contrast. Multiplanar CT image reconstructions and MIPs were obtained to evaluate the vascular anatomy. CONTRAST:  64mL OMNIPAQUE IOHEXOL 350 MG/ML SOLN COMPARISON:  None. FINDINGS: Cardiovascular: There is mild calcification of the aortic arch without evidence of aortic aneurysm. Satisfactory opacification of the pulmonary arteries to the segmental level. No evidence  of pulmonary embolism. Normal heart size with mild to moderate severity coronary artery calcification. No pericardial effusion. Mediastinum/Nodes: No enlarged mediastinal, hilar, or axillary lymph nodes. Thyroid gland is surgically absent. The trachea and esophagus demonstrate no significant findings. Lungs/Pleura: Moderate to marked severity emphysematous lung disease is seen involving predominantly the bilateral upper lobes. Mild areas of  linear scarring and/or atelectasis are seen within the bilateral lower lobes. There is no evidence of a pleural effusion or pneumothorax. Upper Abdomen: No acute abnormality. Musculoskeletal: No chest wall abnormality. No acute or significant osseous findings. Review of the MIP images confirms the above findings. IMPRESSION: 1. No evidence of pulmonary embolism. 2. Moderate to marked severity emphysematous lung disease. 3. Mild bilateral lower lobe linear scarring and/or atelectasis. Aortic Atherosclerosis (ICD10-I70.0) and Emphysema (ICD10-J43.9). Electronically Signed   By: Virgina Norfolk M.D.   On: 08/04/2021 22:06   CT CORONARY MORPH W/CTA COR W/SCORE W/CA W/CM &/OR WO/CM  Addendum Date: 08/06/2021   ADDENDUM REPORT: 08/06/2021 08:24 EXAM: OVER-READ INTERPRETATION  CT CHEST The following report is an over-read performed by radiologist Dr. Rebekah [facility] on 08/06/2021. This over-read does not include interpretation of cardiac or coronary anatomy or pathology. The coronary calcium score and cardiac CTA interpretation by the cardiologist is attached. COMPARISON:  Chest CTA 08/04/2021. FINDINGS: Aortic atherosclerosis. Diffuse bronchial wall thickening with moderate centrilobular and paraseptal emphysema. Extensive areas of the linear architectural distortion and volume loss in the lower lobes of the lungs bilaterally, likely reflect areas of chronic post infectious or inflammatory scarring. Within the visualized portions of the thorax there are no suspicious appearing pulmonary nodules or masses, there is no acute consolidative airspace disease, no pleural effusions, no pneumothorax and no lymphadenopathy. Visualized portions of the upper abdomen are unremarkable. There are no aggressive appearing lytic or blastic lesions noted in the visualized portions of the skeleton. IMPRESSION: 1. Extensive post infectious or inflammatory scarring in the lower lobes of the lungs bilaterally.  2. Mild diffuse bronchial wall thickening with moderate centrilobular and paraseptal emphysema; imaging findings suggestive of underlying COPD. 3. Aortic atherosclerosis. Aortic Atherosclerosis (ICD10-I70.0) and Emphysema (ICD10-J43.9). Electronically Signed   By: Vinnie Langton M.D.   On: 08/06/2021 08:24   Addendum Date: 08/06/2021   ADDENDUM REPORT: 08/06/2021 07:47 Electronically Signed   By: Jenkins Rouge M.D.   On: 08/06/2021 07:47   Result Date: 08/06/2021 CLINICAL DATA:  Chest pain EXAM: Cardiac CTA MEDICATIONS: Sub lingual nitro.  $Remov'4mg'ZvOojp$  TECHNIQUE: The patient was scanned on a Siemens Force 644 slice scanner. Gantry rotation speed was 250 msecs. Collimation was .6 mm. A 100 kV prospective scan was triggered in the ascending thoracic aorta at 140 HU's Full mA was used between 35% and 75% of the R-R interval. Average HR during the scan was 61 bpm. The 3D data set was interpreted on a dedicated work station using MPR, MIP and VRT modes. A total of 80 cc of contrast was used. FINDINGS: Non-cardiac: See separate report from [facility]. No significant findings on limited lung and soft tissue windows. Calcium Score: 3 vessel calcium noted Coronary Arteries: Right dominant with no anomalies LM: Normal LAD: 50-69% mixed plaque in mid vessel just before D2 take off D1: Normal D2: 25-49% mixed plaque in proximal vessel Circumflex: 25-49% mixed plaque mid circumflex at bifurcation of AV groove and OM1 RCA: Very tortuous with motion artifact in mid vessel 1-24% calcified plaque in proximal and distal vessel PDA: Normal PLA: Normal IMPRESSION: 1. Calcium score 72.8  which is 60 st percentile for age and sex 2.  Normal ascending thoracic aorta 3.1 cm 3. CAD RADS 3 CAD. Given mixed plaque nature of disease study will be sent for [facility] CT Patient would benefit from intense statin Rx Jenkins Rouge Electronically Signed: By: Jenkins Rouge M.D. On: 08/06/2021 07:36   CT CORONARY FRACTIONAL FLOW RESERVE FLUID  ANALYSIS  Result Date: 08/06/2021 CLINICAL DATA:  CAD EXAM: FFR CT TECHNIQUE: The best systolic and diastolic phases of the patients gated cardiac CTA sent to [facility] for hemodynamic analysis . FINDINGS: FFR CT positive in mid circumflex at OM1 take off 0.59 Values normal elsewhere particularly mid LAD/D1 0.88 IMPRESSION: Positive FFR CT 0.59 in mid circumflex with mixed plaque In setting of acute chest pain syndrome Patient should be referred for cardiac catheterization Jenkins Rouge Electronically Signed   By: Jenkins Rouge M.D.   On: 08/06/2021 07:46   ECHOCARDIOGRAM COMPLETE  Result Date: 08/05/2021    ECHOCARDIOGRAM REPORT   Patient Name:   Emma Wilson Date of Exam: 08/05/2021 Medical Rec #:  [id number]        Height:       67.0 in Accession #:    [id number]       Weight:       124.6 lb Date of Birth:  January 01, 1956       BSA:          1.654 m Patient Age:    63 years         BP:           109/76 mmHg Patient Gender: F                HR:           49 bpm. Exam Location:  Inpatient Procedure: 2D Echo, Cardiac Doppler and Color Doppler Indications:    R07.9* Chest pain, unspecified  History:        Patient has prior history of Echocardiogram examinations, most                 recent 12/06/2013. COPD, Arrythmias:Bradycardia; Risk                 Factors:Dyslipidemia.  Sonographer:    Bernadene Person RDCS Referring Phys: [id number] [place]  1. Left ventricular ejection fraction, by estimation, is 55 to 60%. The left ventricle has normal function. The left ventricle has no regional wall motion abnormalities. Left ventricular diastolic parameters are consistent with Grade I diastolic dysfunction (impaired relaxation).  2. Right ventricular systolic function is normal. The right ventricular size is normal. There is normal pulmonary artery systolic pressure. The estimated right ventricular systolic pressure is 21.1 mmHg.  3. The mitral valve is grossly normal. No evidence of mitral valve  regurgitation. No evidence of mitral stenosis.  4. The aortic valve is tricuspid. There is mild calcification of the aortic valve. Aortic valve regurgitation is not visualized. No aortic stenosis is present.  5. The inferior vena cava is normal in size with greater than 50% respiratory variability, suggesting right atrial pressure of 3 mmHg.  6. Cannot exclude small patent foramen ovale. Redundant atrial septum. FINDINGS  Left Ventricle: Left ventricular ejection fraction, by estimation, is 55 to 60%. The left ventricle has normal function. The left ventricle has no regional wall motion abnormalities. The left ventricular internal cavity size was normal in size. There is  no left ventricular hypertrophy. Left ventricular diastolic parameters are consistent with Grade I  diastolic dysfunction (impaired relaxation). Right Ventricle: The right ventricular size is normal. No increase in right ventricular wall thickness. Right ventricular systolic function is normal. There is normal pulmonary artery systolic pressure. The tricuspid regurgitant velocity is 2.15 m/s, and  with an assumed right atrial pressure of 3 mmHg, the estimated right ventricular systolic pressure is 62.0 mmHg. Left Atrium: Left atrial size was normal in size. Right Atrium: Right atrial size was normal in size. Pericardium: There is no evidence of pericardial effusion. Mitral Valve: The mitral valve is grossly normal. Mild mitral annular calcification. No evidence of mitral valve regurgitation. No evidence of mitral valve stenosis. Tricuspid Valve: The tricuspid valve is normal in structure. Tricuspid valve regurgitation is mild . No evidence of tricuspid stenosis. Aortic Valve: The aortic valve is tricuspid. There is mild calcification of the aortic valve. Aortic valve regurgitation is not visualized. No aortic stenosis is present. Pulmonic Valve: The pulmonic valve was not well visualized. Pulmonic valve regurgitation is trivial. No evidence of  pulmonic stenosis. Aorta: The aortic root is normal in size and structure. Venous: The inferior vena cava is normal in size with greater than 50% respiratory variability, suggesting right atrial pressure of 3 mmHg. IAS/Shunts: There is redundancy of the interatrial septum. Cannot exclude small patent foramen ovale. Redundant atrial septum.  LEFT VENTRICLE PLAX 2D LVIDd:         4.10 cm  Diastology LVIDs:         2.80 cm  LV e' medial:    4.81 cm/s LV PW:         0.80 cm  LV E/e' medial:  10.0 LV IVS:        0.80 cm  LV e' lateral:   8.63 cm/s LVOT diam:     2.20 cm  LV E/e' lateral: 5.6 LV SV:         75 LV SV Index:   45 LVOT Area:     3.80 cm  RIGHT VENTRICLE RV S prime:     9.60 cm/s TAPSE (M-mode): 1.9 cm LEFT ATRIUM             Index       RIGHT ATRIUM           Index LA diam:        2.50 cm 1.51 cm/m  RA Area:     11.00 cm LA Vol (A2C):   39.6 ml 23.95 ml/m RA Volume:   25.00 ml  15.12 ml/m LA Vol (A4C):   38.2 ml 23.10 ml/m LA Biplane Vol: 39.6 ml 23.95 ml/m  AORTIC VALVE LVOT Vmax:   84.00 cm/s LVOT Vmean:  50.600 cm/s LVOT VTI:    0.196 m  AORTA Ao Root diam: 3.30 cm Ao Asc diam:  3.10 cm MITRAL VALVE               TRICUSPID VALVE MV Area (PHT): 2.50 cm    TR Peak grad:   18.5 mmHg MV Decel Time: 303 msec    TR Vmax:        215.00 cm/s MV E velocity: 48.30 cm/s MV A velocity: 74.40 cm/s  SHUNTS MV E/A ratio:  0.65        Systemic VTI:  0.20 m                            Systemic Diam: 2.20 cm Cherlynn Kaiser MD Electronically signed by Cherlynn Kaiser MD Signature Date/Time:  08/05/2021/11:58:29 AM    Final      Assessment and Plan:   CAD per CTA coronary study  Stable angina - presented with intermittent chest pain since 08/03/21 office visit - Hs trop negative x2 - EKG without acute ischemic changes - CTA coronary study 9/21 showed Calcium score 72.8 which is 57 st percentile for age and sex and Positive FFR CT 0.59 in mid circumflex with mixed plaque - discussed risk versus benefit of  cardiac cath at bedside, patient wishes to speak to Dr Emma Wilson office directly before consenting, advised staff RN to call back if patient agrees, maintain NPO for now  - continue home med ASA $Remov'81mg'FNOqdh$  daily, started on lipitor $RemoveBe'80mg'aopuEcbNq$  daily, hold off BB as she is bradycardia 50s   HLD - LDL 138 , started on lipitor $RemoveBe'80mg'HKyzmQdSY$  daily   Tobacco abuse  - recommend smoking cessation   Hypothyroidism COPD - managed per IM     Risk Assessment/Risk Scores:   HEAR Score (for undifferentiated chest pain):  HEAR Score: 4      For questions or updates, please contact [place] Please consult [email or web address] for contact info under    Signed, Margie Billet, NP  08/06/2021 8:35 AM

## 2021-08-07 ENCOUNTER — Encounter (HOSPITAL_COMMUNITY): Payer: Self-pay | Admitting: Cardiology

## 2021-08-07 ENCOUNTER — Other Ambulatory Visit (HOSPITAL_COMMUNITY): Payer: Self-pay

## 2021-08-07 ENCOUNTER — Telehealth: Payer: Self-pay | Admitting: Cardiology

## 2021-08-07 DIAGNOSIS — J449 Chronic obstructive pulmonary disease, unspecified: Secondary | ICD-10-CM

## 2021-08-07 DIAGNOSIS — Z20822 Contact with and (suspected) exposure to covid-19: Secondary | ICD-10-CM | POA: Diagnosis not present

## 2021-08-07 DIAGNOSIS — I25110 Atherosclerotic heart disease of native coronary artery with unstable angina pectoris: Secondary | ICD-10-CM | POA: Diagnosis not present

## 2021-08-07 DIAGNOSIS — R931 Abnormal findings on diagnostic imaging of heart and coronary circulation: Secondary | ICD-10-CM

## 2021-08-07 DIAGNOSIS — I200 Unstable angina: Secondary | ICD-10-CM | POA: Diagnosis not present

## 2021-08-07 DIAGNOSIS — E890 Postprocedural hypothyroidism: Secondary | ICD-10-CM | POA: Diagnosis not present

## 2021-08-07 DIAGNOSIS — E039 Hypothyroidism, unspecified: Secondary | ICD-10-CM | POA: Diagnosis not present

## 2021-08-07 LAB — CBC
HCT: 34.2 % — ABNORMAL LOW (ref 36.0–46.0)
Hemoglobin: 11.9 g/dL — ABNORMAL LOW (ref 12.0–15.0)
MCH: 28.1 pg (ref 26.0–34.0)
MCHC: 34.8 g/dL (ref 30.0–36.0)
MCV: 80.7 fL (ref 80.0–100.0)
Platelets: 254 10*3/uL (ref 150–400)
RBC: 4.24 MIL/uL (ref 3.87–5.11)
RDW: 12.4 % (ref 11.5–15.5)
WBC: 4.1 10*3/uL (ref 4.0–10.5)
nRBC: 0 % (ref 0.0–0.2)

## 2021-08-07 LAB — BASIC METABOLIC PANEL
Anion gap: 6 (ref 5–15)
BUN: 8 mg/dL (ref 8–23)
CO2: 23 mmol/L (ref 22–32)
Calcium: 8.9 mg/dL (ref 8.9–10.3)
Chloride: 108 mmol/L (ref 98–111)
Creatinine, Ser: 0.82 mg/dL (ref 0.44–1.00)
GFR, Estimated: >60 mL/min (ref >60)
Glucose, Bld: 86 mg/dL (ref 70–99)
Potassium: 3.6 mmol/L (ref 3.5–5.1)
Sodium: 137 mmol/L (ref 135–145)

## 2021-08-07 MED ORDER — ATORVASTATIN CALCIUM 80 MG PO TABS
80.0000 mg | ORAL_TABLET | Freq: Every day | ORAL | 3 refills | Status: DC
  Filled 2021-08-07: qty 30, 30d supply, fill #0

## 2021-08-07 MED ORDER — TICAGRELOR 90 MG PO TABS
90.0000 mg | ORAL_TABLET | Freq: Two times a day (BID) | ORAL | 3 refills | Status: DC
  Filled 2021-08-07: qty 60, 30d supply, fill #0

## 2021-08-07 MED ORDER — ROSUVASTATIN CALCIUM 40 MG PO TABS
40.0000 mg | ORAL_TABLET | Freq: Every day | ORAL | 2 refills | Status: DC
  Filled 2021-08-07: qty 30, 30d supply, fill #0

## 2021-08-07 NOTE — Discharge Summary (Signed)
TotalPhysician Discharge Summary  Emma Wilson HDQ:[id number] DOB: 08-27-1956 DOA: 08/04/2021  PCP: Merrilee Seashore, MD  Admit date: 08/04/2021 Discharge date: 08/07/2021  Admitted From: Home Disposition:  home  Recommendations for Outpatient Follow-up:  Follow up with Cards in 1-2 weeks Please obtain BMP/CBC in one week   Home Health:No Equipment/Devices:none  Discharge Condition:Stable CODE STATUS:Full Diet recommendation: Heart Healthy   Brief/Interim Summary: 65 y.o. female past medical history significant for COPD, hyperlipidemia hypothyroidism who was seen by her PCP on 08/03/2021 for chest pain that have been going on for 4 days prior to her visit to her PCP, came into the ED on 08/04/2021 for chest pain, she is currently chest pain-free  Discharge Diagnoses:  Principal Problem:   Unstable angina ([place]) Active Problems:   Hypothyroidism   COPD (chronic obstructive pulmonary disease) ([place])   Pure hypercholesterolemia   Chest pain   Abnormal cardiac CT angiography   Coronary artery disease involving native coronary artery of native heart with unstable angina pectoris ([place])  Unstable angina: Coronary CT showed severe OM1 lesion, cardiac markers were negative, twelve-lead EKG showed no signs of ischemia. Cardiology was consulted recommended cardiac cath performed on 08/06/2021 with successful PCI to the mid circumflex and LAD. Cardiology recommended to continue aggressive medical management DAPT therapy for at least a year and higher dose statins.  COPD: Continue inhalers.  Dyslipidemia: Excellent continue statins.  Hypothyroidism: Continue Synthroid.  Tobacco abuse: She has been counseled about quitting. Discharge Instructions  Discharge Instructions     Diet - low sodium heart healthy   Complete by: As directed    Increase activity slowly   Complete by: As directed       Allergies as of 08/07/2021   No Known Allergies      Medication List      STOP taking these medications    OVER THE COUNTER MEDICATION   OVER THE COUNTER MEDICATION       TAKE these medications    aspirin EC 81 MG tablet Take 81 mg by mouth daily. Swallow whole.   atorvastatin 80 MG tablet Commonly known as: LIPITOR Take 1 tablet (80 mg total) by mouth daily.   calcium gluconate 500 MG tablet Take 500 mg by mouth daily.   diclofenac sodium 1 % Gel Commonly known as: VOLTAREN Apply 1 application topically 3 (three) times daily as needed (pain).   pregabalin 50 MG capsule Commonly known as: LYRICA Take 50 mg by mouth daily.   REFRESH OP Place 1 drop into both eyes daily as needed (dry eyes).   Synthroid 137 MCG tablet Generic drug: levothyroxine Take 1 tablet (137 mcg total) by mouth daily before breakfast.   ticagrelor 90 MG Tabs tablet Commonly known as: BRILINTA Take 1 tablet (90 mg total) by mouth 2 (two) times daily.   tiZANidine 4 MG tablet Commonly known as: ZANAFLEX Take 4 mg by mouth 2 (two) times daily as needed for muscle spasms.   traMADol 50 MG tablet Commonly known as: ULTRAM Take 50 mg by mouth 3 (three) times daily as needed for moderate pain.        No Known Allergies  Consultations: Cardiology   Procedures/Studies: DG Chest 2 View  Result Date: 08/04/2021 CLINICAL DATA:  Chest pain beginning several days ago. EXAM: CHEST - 2 VIEW COMPARISON:  04/23/2020 FINDINGS: Heart size is normal. Aortic atherosclerotic calcification. 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 No sign of active infiltrate, mass, effusion or collapse. No acute bone finding. Surgical  clips at the thyroid region. IMPRESSION: No active disease. Background emphysema and pulmonary scarring. Aortic atherosclerotic calcification. Electronically Signed   By: Nelson Chimes M.D.   On: 08/04/2021 16:03   CT Angio Chest PE W and/or Wo Contrast  Result Date: 08/04/2021 CLINICAL DATA:  Central chest pain x5 days. EXAM: CT ANGIOGRAPHY CHEST WITH  CONTRAST TECHNIQUE: Multidetector CT imaging of the chest was performed using the standard protocol during bolus administration of intravenous contrast. Multiplanar CT image reconstructions and MIPs were obtained to evaluate the vascular anatomy. CONTRAST:  45mL OMNIPAQUE IOHEXOL 350 MG/ML SOLN COMPARISON:  None. FINDINGS: Cardiovascular: There is mild calcification of the aortic arch without evidence of aortic aneurysm. Satisfactory opacification of the pulmonary arteries to the segmental level. No evidence of pulmonary embolism. Normal heart size with mild to moderate severity coronary artery calcification. No pericardial effusion. Mediastinum/Nodes: No enlarged mediastinal, hilar, or axillary lymph nodes. Thyroid gland is surgically absent. The trachea and esophagus demonstrate no significant findings. Lungs/Pleura: Moderate to marked severity emphysematous lung disease is seen involving predominantly the bilateral upper lobes. Mild areas of linear scarring and/or atelectasis are seen within the bilateral lower lobes. There is no evidence of a pleural effusion or pneumothorax. Upper Abdomen: No acute abnormality. Musculoskeletal: No chest wall abnormality. No acute or significant osseous findings. Review of the MIP images confirms the above findings. IMPRESSION: 1. No evidence of pulmonary embolism. 2. Moderate to marked severity emphysematous lung disease. 3. Mild bilateral lower lobe linear scarring and/or atelectasis. Aortic Atherosclerosis (ICD10-I70.0) and Emphysema (ICD10-J43.9). Electronically Signed   By: Virgina Norfolk M.D.   On: 08/04/2021 22:06   MR LUMBAR SPINE WO CONTRAST  Result Date: 07/31/2021 CLINICAL DATA:  Low back pain, radiating to right leg EXAM: MRI LUMBAR SPINE WITHOUT CONTRAST TECHNIQUE: Multiplanar, multisequence MR imaging of the lumbar spine was performed. No intravenous contrast was administered. COMPARISON:  12/20/2019 FINDINGS: Segmentation: Transitional lumbosacral anatomy,  with sacralization of L5. Alignment:  Physiologic. Vertebrae:  No fracture, evidence of discitis, or bone lesion. Conus medullaris and cauda equina: Conus extends to the L1 level. Conus and cauda equina appear normal. Paraspinal and other soft tissues: Small left renal cysts. Infrarenal abdominal aortic ectasia, measuring up to 2.3 cm (series 6, image 22), with the more proximal infrarenal aorta measuring up to 1.6 cm. Disc levels: T12-L1: Redemonstrated large central disc extrusion with caudal migration, unchanged from the prior exam. Moderate facet arthropathy. No spinal canal stenosis. Mild bilateral neural foraminal narrowing, which has progressed from the prior exam. L1-L2: Mild disc bulge. Moderate facet arthropathy with ligamentum flavum hypertrophy. No spinal canal stenosis. Mild bilateral neural foraminal narrowing, which has progressed from prior exam. L2-L3: Broad-based disc bulge. Moderate facet arthropathy. Ligamentum flavum hypertrophy. Mild narrowing of the left lateral recess. No spinal canal stenosis. Mild left greater than right neural foraminal narrowing, which has progressed from the prior exam. L3-L4: Broad-based disc bulge. Severe left and moderate right facet arthropathy, with ligamentum flavum hypertrophy. Narrowing of the bilateral lateral recesses. No spinal canal stenosis. Mild left neural foraminal narrowing. L4-L5: No significant disc bulge. Moderate facet arthropathy. No spinal canal stenosis. No neural foraminal narrowing. L5-S1: No significant disc bulge. No spinal canal stenosis or neural foraminal narrowing. IMPRESSION: 1. Multilevel facet arthropathy, which contributes to mild neural foraminal narrowing bilaterally at T12-L1, L1-L2, and L2-L3, as well as on the left at L3-L4, progressed from the prior exam. 2. Redemonstrated central disc extrusion with caudal migration at T12-L1, without spinal canal stenosis. Electronically Signed  By: Merilyn Baba M.D.   On: 07/31/2021 20:26    CARDIAC CATHETERIZATION  Result Date: 08/06/2021   Mid LM to Dist LM lesion is 30% stenosed.   Ost LAD lesion is 50% stenosed.   Lesion #1: Prox LAD to Mid LAD lesion is 65% stenosed with 35% stenosed side branch in 2nd Diag.-FFR 0.73.   A drug-eluting stent was successfully placed using a SYNERGY XD 2.75X20.  Postdilated tapered fashion from 3.1 to 2.9 mm.  (Proximal optimization)   Post intervention, there is a 0% residual stenosis in the parent vessel, and the side branch stenosis was stable at 35% residual stenosis.   --------------------------------------------------------------   Lesion #2 and 3: Prox Cx-1 lesion is 35% stenosed with 60% stenosed side branch in LPAV (AVG).Prox Cx-2 lesion is 90% stenosed.  This was the culprit lesion noted on CT FFR   A drug-eluting stent was successfully placed crossing the sidebranch, using a SYNERGY XD 3.0X12.  Postdilated to 3.1 mm.   Balloon angioplasty was performed through the stent in the ostium of the LPAV/AVG branch, using a BALLN [place] EMERGE MR 2.5X8.   Post intervention, there is a 0% residual stenosis of the main channel LCx, and  the side branch was reduced to 30% residual stenosis in the sidebranch ostium.Marland Kitchen   ------------------------------------------------------------   Small to moderate caliber, yet dominant RCA tortuous, but no significant disease.   LV end diastolic pressure is normal.   There is no aortic valve stenosis. SUMMARY Severe two-vessel bifurcation CAD: Consistent with coronary CTA, 99% mid LCx at AVG takeoff -> Successful bifurcation DES PCI of mid LCx with angioplasty of ostial AVG: Synergy DES 3.0 m x 12 mm postdilated to 3.1 mm, PTCA of AVG sidebranch using 2.5 mm Hartman balloon.  (Residual stenosis and main branch 0%, and sidebranch 20% with TIMI-3 flow pre and post.) Ostial LAD 50% stenosis (FFR 0.85), proximal-mid LAD at 2nd Diag 65% irregular stenosis (FFR 0.73) Successful bifurcation PCI of proximal to mid LAD: Synergy DES 2.75 mm x 18 mm  with tapered post elation from 3.1 to 2.8 mm. Normal LVEDP and normal EF by Echo. RECOMMENDATIONS Return to nursing for ongoing care TR band removal.  Post cath hydration. Continue to titrate Aggressive Guideline Directed Medical Therapy for CAD. DAPT uninterrupted for 1 year, then reduce dose Brilinta versus Plavix 75 mg for second year. Expected discharge from cardiac cath-PCI standpoint by 08/07/2021 Case results discussed with patient's sister and son via telephone. Glenetta Hew, MD  CT CORONARY [facility] W/CTA COR W/SCORE Lewanda Rife W/CM &/OR WO/CM  Addendum Date: 08/06/2021   ADDENDUM REPORT: 08/06/2021 08:24 EXAM: OVER-READ INTERPRETATION  CT CHEST The following report is an over-read performed by radiologist Dr. Rebekah [facility] on 08/06/2021. This over-read does not include interpretation of cardiac or coronary anatomy or pathology. The coronary calcium score and cardiac CTA interpretation by the cardiologist is attached. COMPARISON:  Chest CTA 08/04/2021. FINDINGS: Aortic atherosclerosis. Diffuse bronchial wall thickening with moderate centrilobular and paraseptal emphysema. Extensive areas of the linear architectural distortion and volume loss in the lower lobes of the lungs bilaterally, likely reflect areas of chronic post infectious or inflammatory scarring. Within the visualized portions of the thorax there are no suspicious appearing pulmonary nodules or masses, there is no acute consolidative airspace disease, no pleural effusions, no pneumothorax and no lymphadenopathy. Visualized portions of the upper abdomen are unremarkable. There are no aggressive appearing lytic or blastic lesions noted in the visualized portions of the skeleton.  IMPRESSION: 1. Extensive post infectious or inflammatory scarring in the lower lobes of the lungs bilaterally. 2. Mild diffuse bronchial wall thickening with moderate centrilobular and paraseptal emphysema; imaging findings suggestive of underlying  COPD. 3. Aortic atherosclerosis. Aortic Atherosclerosis (ICD10-I70.0) and Emphysema (ICD10-J43.9). Electronically Signed   By: Vinnie Langton M.D.   On: 08/06/2021 08:24   Addendum Date: 08/06/2021   ADDENDUM REPORT: 08/06/2021 07:47 Electronically Signed   By: Jenkins Rouge M.D.   On: 08/06/2021 07:47   Result Date: 08/06/2021 CLINICAL DATA:  Chest pain EXAM: Cardiac CTA MEDICATIONS: Sub lingual nitro.  4mg  TECHNIQUE: The patient was scanned on a Siemens Force 793 slice scanner. Gantry rotation speed was 250 msecs. Collimation was .6 mm. A 100 kV prospective scan was triggered in the ascending thoracic aorta at 140 HU's Full mA was used between 35% and 75% of the R-R interval. Average HR during the scan was 61 bpm. The 3D data set was interpreted on a dedicated work station using MPR, MIP and VRT modes. A total of 80 cc of contrast was used. FINDINGS: Non-cardiac: See separate report from [facility]. No significant findings on limited lung and soft tissue windows. Calcium Score: 3 vessel calcium noted Coronary Arteries: Right dominant with no anomalies LM: Normal LAD: 50-69% mixed plaque in mid vessel just before D2 take off D1: Normal D2: 25-49% mixed plaque in proximal vessel Circumflex: 25-49% mixed plaque mid circumflex at bifurcation of AV groove and OM1 RCA: Very tortuous with motion artifact in mid vessel 1-24% calcified plaque in proximal and distal vessel PDA: Normal PLA: Normal IMPRESSION: 1. Calcium score 72.8 which is 59 st percentile for age and sex 2.  Normal ascending thoracic aorta 3.1 cm 3. CAD RADS 3 CAD. Given mixed plaque nature of disease study will be sent for [facility] CT Patient would benefit from intense statin Rx Jenkins Rouge Electronically Signed: By: Jenkins Rouge M.D. On: 08/06/2021 07:36   CT CORONARY FRACTIONAL FLOW RESERVE FLUID ANALYSIS  Result Date: 08/06/2021 CLINICAL DATA:  CAD EXAM: FFR CT TECHNIQUE: The best systolic and diastolic phases of the patients gated  cardiac CTA sent to [facility] for hemodynamic analysis . FINDINGS: FFR CT positive in mid circumflex at OM1 take off 0.59 Values normal elsewhere particularly mid LAD/D1 0.88 IMPRESSION: Positive FFR CT 0.59 in mid circumflex with mixed plaque In setting of acute chest pain syndrome Patient should be referred for cardiac catheterization Jenkins Rouge Electronically Signed   By: Jenkins Rouge M.D.   On: 08/06/2021 07:46   ECHOCARDIOGRAM COMPLETE  Result Date: 08/05/2021    ECHOCARDIOGRAM REPORT   Patient Name:   ENIYA CANNADY Date of Exam: 08/05/2021 Medical Rec #:  [id number]        Height:       67.0 in Accession #:    [id number]       Weight:       124.6 lb Date of Birth:  07-30-1956       BSA:          1.654 m Patient Age:    65 years         BP:           109/76 mmHg Patient Gender: F                HR:           49 bpm. Exam Location:  Inpatient Procedure: 2D Echo, Cardiac Doppler and Color Doppler Indications:    R07.9*  Chest pain, unspecified  History:        Patient has prior history of Echocardiogram examinations, most                 recent 12/06/2013. COPD, Arrythmias:Bradycardia; Risk                 Factors:Dyslipidemia.  Sonographer:    Bernadene Person RDCS Referring Phys: [id number] [place]  1. Left ventricular ejection fraction, by estimation, is 55 to 60%. The left ventricle has normal function. The left ventricle has no regional wall motion abnormalities. Left ventricular diastolic parameters are consistent with Grade I diastolic dysfunction (impaired relaxation).  2. Right ventricular systolic function is normal. The right ventricular size is normal. There is normal pulmonary artery systolic pressure. The estimated right ventricular systolic pressure is 12.8 mmHg.  3. The mitral valve is grossly normal. No evidence of mitral valve regurgitation. No evidence of mitral stenosis.  4. The aortic valve is tricuspid. There is mild calcification of the aortic valve. Aortic valve  regurgitation is not visualized. No aortic stenosis is present.  5. The inferior vena cava is normal in size with greater than 50% respiratory variability, suggesting right atrial pressure of 3 mmHg.  6. Cannot exclude small patent foramen ovale. Redundant atrial septum. FINDINGS  Left Ventricle: Left ventricular ejection fraction, by estimation, is 55 to 60%. The left ventricle has normal function. The left ventricle has no regional wall motion abnormalities. The left ventricular internal cavity size was normal in size. There is  no left ventricular hypertrophy. Left ventricular diastolic parameters are consistent with Grade I diastolic dysfunction (impaired relaxation). Right Ventricle: The right ventricular size is normal. No increase in right ventricular wall thickness. Right ventricular systolic function is normal. There is normal pulmonary artery systolic pressure. The tricuspid regurgitant velocity is 2.15 m/s, and  with an assumed right atrial pressure of 3 mmHg, the estimated right ventricular systolic pressure is 78.6 mmHg. Left Atrium: Left atrial size was normal in size. Right Atrium: Right atrial size was normal in size. Pericardium: There is no evidence of pericardial effusion. Mitral Valve: The mitral valve is grossly normal. Mild mitral annular calcification. No evidence of mitral valve regurgitation. No evidence of mitral valve stenosis. Tricuspid Valve: The tricuspid valve is normal in structure. Tricuspid valve regurgitation is mild . No evidence of tricuspid stenosis. Aortic Valve: The aortic valve is tricuspid. There is mild calcification of the aortic valve. Aortic valve regurgitation is not visualized. No aortic stenosis is present. Pulmonic Valve: The pulmonic valve was not well visualized. Pulmonic valve regurgitation is trivial. No evidence of pulmonic stenosis. Aorta: The aortic root is normal in size and structure. Venous: The inferior vena cava is normal in size with greater than 50%  respiratory variability, suggesting right atrial pressure of 3 mmHg. IAS/Shunts: There is redundancy of the interatrial septum. Cannot exclude small patent foramen ovale. Redundant atrial septum.  LEFT VENTRICLE PLAX 2D LVIDd:         4.10 cm  Diastology LVIDs:         2.80 cm  LV e' medial:    4.81 cm/s LV PW:         0.80 cm  LV E/e' medial:  10.0 LV IVS:        0.80 cm  LV e' lateral:   8.63 cm/s LVOT diam:     2.20 cm  LV E/e' lateral: 5.6 LV SV:  75 LV SV Index:   45 LVOT Area:     3.80 cm  RIGHT VENTRICLE RV S prime:     9.60 cm/s TAPSE (M-mode): 1.9 cm LEFT ATRIUM             Index       RIGHT ATRIUM           Index LA diam:        2.50 cm 1.51 cm/m  RA Area:     11.00 cm LA Vol (A2C):   39.6 ml 23.95 ml/m RA Volume:   25.00 ml  15.12 ml/m LA Vol (A4C):   38.2 ml 23.10 ml/m LA Biplane Vol: 39.6 ml 23.95 ml/m  AORTIC VALVE LVOT Vmax:   84.00 cm/s LVOT Vmean:  50.600 cm/s LVOT VTI:    0.196 m  AORTA Ao Root diam: 3.30 cm Ao Asc diam:  3.10 cm MITRAL VALVE               TRICUSPID VALVE MV Area (PHT): 2.50 cm    TR Peak grad:   18.5 mmHg MV Decel Time: 303 msec    TR Vmax:        215.00 cm/s MV E velocity: 48.30 cm/s MV A velocity: 74.40 cm/s  SHUNTS MV E/A ratio:  0.65        Systemic VTI:  0.20 m                            Systemic Diam: 2.20 cm Cherlynn Kaiser MD Electronically signed by Cherlynn Kaiser MD Signature Date/Time: 08/05/2021/11:58:29 AM    Final     Subjective: No complaints  Discharge Exam: Vitals:   08/06/21 2342 08/07/21 0703  BP: (!) 117/49 (!) 156/85  Pulse: (!) 50 60  Resp: 16 18  Temp: 98 F (36.7 C) 98.4 F (36.9 C)  SpO2: 95% 100%   Vitals:   08/06/21 1622 08/06/21 2011 08/06/21 2342 08/07/21 0703  BP: (!) 155/94 140/86 (!) 117/49 (!) 156/85  Pulse:  (!) 59 (!) 50 60  Resp: 18 15 16 18   Temp:  98.4 F (36.9 C) 98 F (36.7 C) 98.4 F (36.9 C)  TempSrc:  Oral Oral Oral  SpO2:  93% 95% 100%  Weight:      Height:        General: Pt is alert, awake,  not in acute distress Cardiovascular: RRR, S1/S2 +, no rubs, no gallops Respiratory: CTA bilaterally, no wheezing, no rhonchi Abdominal: Soft, NT, ND, bowel sounds + Extremities: no edema, no cyanosis    The results of significant diagnostics from this hospitalization (including imaging, microbiology, ancillary and laboratory) are listed below for reference.     Microbiology: Recent Results (from the past 240 hour(s))  Resp Panel by RT-PCR (Flu A&B, Covid) Nasopharyngeal Swab     Status: None   Collection Time: 08/04/21  7:20 PM   Specimen: Nasopharyngeal Swab; Nasopharyngeal(NP) swabs in vial transport medium  Result Value Ref Range Status   SARS Coronavirus 2 by RT PCR NEGATIVE NEGATIVE Final    Comment: (NOTE) SARS-CoV-2 target nucleic acids are NOT DETECTED.  The SARS-CoV-2 RNA is generally detectable in upper respiratory specimens during the acute phase of infection. The lowest concentration of SARS-CoV-2 viral copies this assay can detect is 138 copies/mL. A negative result does not preclude SARS-Cov-2 infection and should not be used as the sole basis for treatment or other patient management decisions. A negative  result may occur with  improper specimen collection/handling, submission of specimen other than nasopharyngeal swab, presence of viral mutation(s) within the areas targeted by this assay, and inadequate number of viral copies(<138 copies/mL). A negative result must be combined with clinical observations, patient history, and epidemiological information. The expected result is Negative.  Fact Sheet for Patients:  EntrepreneurPulse.com.au  Fact Sheet for Healthcare Providers:  IncredibleEmployment.be  This test is no t yet approved or cleared by the [place] FDA and  has been authorized for detection and/or diagnosis of SARS-CoV-2 by FDA under an Emergency Use Authorization (EUA). This EUA will remain  in effect  (meaning this test can be used) for the duration of the COVID-19 declaration under Section 564(b)(1) of the Act, 21 U.S.C.section 360bbb-3(b)(1), unless the authorization is terminated  or revoked sooner.       Influenza A by PCR NEGATIVE NEGATIVE Final   Influenza B by PCR NEGATIVE NEGATIVE Final    Comment: (NOTE) The Xpert Xpress SARS-CoV-2/FLU/RSV plus assay is intended as an aid in the diagnosis of influenza from Nasopharyngeal swab specimens and should not be used as a sole basis for treatment. Nasal washings and aspirates are unacceptable for Xpert Xpress SARS-CoV-2/FLU/RSV testing.  Fact Sheet for Patients: EntrepreneurPulse.com.au  Fact Sheet for Healthcare Providers: IncredibleEmployment.be  This test is not yet approved or cleared by the [place] FDA and has been authorized for detection and/or diagnosis of SARS-CoV-2 by FDA under an Emergency Use Authorization (EUA). This EUA will remain in effect (meaning this test can be used) for the duration of the COVID-19 declaration under Section 564(b)(1) of the Act, 21 U.S.C. section 360bbb-3(b)(1), unless the authorization is terminated or revoked.  Performed at [facility], [address] [place], [place] [address]      Labs: BNP (last 3 results) No results for input(s): BNP in the last 8760 hours. Basic Metabolic Panel: Recent Labs  Lab 08/04/21 1530 08/05/21 0218 08/07/21 0115  NA 139 136 137  K 3.8 3.6 3.6  CL 102 103 108  CO2 27 24 23   GLUCOSE 87 86 86  BUN 14 10 8   CREATININE 0.78 0.74 0.82  CALCIUM 9.7 9.0 8.9   Liver Function Tests: No results for input(s): AST, ALT, ALKPHOS, BILITOT, PROT, ALBUMIN in the last 168 hours. No results for input(s): LIPASE, AMYLASE in the last 168 hours. No results for input(s): AMMONIA in the last 168 hours. CBC: Recent Labs  Lab 08/04/21 1530 08/05/21 0218 08/07/21 0115  WBC 3.8* 3.6* 4.1  HGB 12.5  12.1 11.9*  HCT 36.3 34.6* 34.2*  MCV 81.4 80.7 80.7  PLT 280 251 254   Cardiac Enzymes: No results for input(s): CKTOTAL, CKMB, CKMBINDEX, TROPONINI in the last 168 hours. BNP: Invalid input(s): POCBNP CBG: No results for input(s): GLUCAP in the last 168 hours. D-Dimer Recent Labs    08/04/21 1920  DDIMER 0.86*   Hgb A1c No results for input(s): HGBA1C in the last 72 hours. Lipid Profile Recent Labs    08/05/21 0218  CHOL 202*  HDL 45  LDLCALC 138*  TRIG 93  CHOLHDL 4.5   Thyroid function studies No results for input(s): TSH, T4TOTAL, T3FREE, THYROIDAB in the last 72 hours.  Invalid input(s): FREET3 Anemia work up No results for input(s): VITAMINB12, FOLATE, FERRITIN, TIBC, IRON, RETICCTPCT in the last 72 hours. Urinalysis    Component Value Date/Time   COLORURINE YELLOW 03/12/2014 1620   APPEARANCEUR CLOUDY (A) 03/12/2014 1620   LABSPEC 1.020 03/12/2014  [place] 5.5 03/12/2014 1620   GLUCOSEU NEGATIVE 03/12/2014 1620   HGBUR MODERATE (A) 03/12/2014 1620   BILIRUBINUR negative 01/26/2021 0905   BILIRUBINUR neg 07/26/2016 0852   KETONESUR negative 01/26/2021 0905   KETONESUR NEGATIVE 03/12/2014 1620   PROTEINUR negative 01/26/2021 0905   PROTEINUR neg 07/26/2016 0852   PROTEINUR NEGATIVE 03/12/2014 1620   UROBILINOGEN 0.2 01/26/2021 0905   UROBILINOGEN 0.2 03/12/2014 1620   NITRITE Positive (A) 01/26/2021 0905   NITRITE neg 07/26/2016 0852   NITRITE NEGATIVE 03/12/2014 1620   LEUKOCYTESUR Large (3+) (A) 01/26/2021 0905   Sepsis Labs Invalid input(s): PROCALCITONIN,  WBC,  LACTICIDVEN Microbiology Recent Results (from the past 240 hour(s))  Resp Panel by RT-PCR (Flu A&B, Covid) Nasopharyngeal Swab     Status: None   Collection Time: 08/04/21  7:20 PM   Specimen: Nasopharyngeal Swab; Nasopharyngeal(NP) swabs in vial transport medium  Result Value Ref Range Status   SARS Coronavirus 2 by RT PCR NEGATIVE NEGATIVE Final    Comment:  (NOTE) SARS-CoV-2 target nucleic acids are NOT DETECTED.  The SARS-CoV-2 RNA is generally detectable in upper respiratory specimens during the acute phase of infection. The lowest concentration of SARS-CoV-2 viral copies this assay can detect is 138 copies/mL. A negative result does not preclude SARS-Cov-2 infection and should not be used as the sole basis for treatment or other patient management decisions. A negative result may occur with  improper specimen collection/handling, submission of specimen other than nasopharyngeal swab, presence of viral mutation(s) within the areas targeted by this assay, and inadequate number of viral copies(<138 copies/mL). A negative result must be combined with clinical observations, patient history, and epidemiological information. The expected result is Negative.  Fact Sheet for Patients:  EntrepreneurPulse.com.au  Fact Sheet for Healthcare Providers:  IncredibleEmployment.be  This test is no t yet approved or cleared by the [place] FDA and  has been authorized for detection and/or diagnosis of SARS-CoV-2 by FDA under an Emergency Use Authorization (EUA). This EUA will remain  in effect (meaning this test can be used) for the duration of the COVID-19 declaration under Section 564(b)(1) of the Act, 21 U.S.C.section 360bbb-3(b)(1), unless the authorization is terminated  or revoked sooner.       Influenza A by PCR NEGATIVE NEGATIVE Final   Influenza B by PCR NEGATIVE NEGATIVE Final    Comment: (NOTE) The Xpert Xpress SARS-CoV-2/FLU/RSV plus assay is intended as an aid in the diagnosis of influenza from Nasopharyngeal swab specimens and should not be used as a sole basis for treatment. Nasal washings and aspirates are unacceptable for Xpert Xpress SARS-CoV-2/FLU/RSV testing.  Fact Sheet for Patients: EntrepreneurPulse.com.au  Fact Sheet for Healthcare  Providers: IncredibleEmployment.be  This test is not yet approved or cleared by the [place] FDA and has been authorized for detection and/or diagnosis of SARS-CoV-2 by FDA under an Emergency Use Authorization (EUA). This EUA will remain in effect (meaning this test can be used) for the duration of the COVID-19 declaration under Section 564(b)(1) of the Act, 21 U.S.C. section 360bbb-3(b)(1), unless the authorization is terminated or revoked.  Performed at [facility], [address], [place], [place] [address]     SIGNED:   Charlynne Cousins, MD  [facility] 08/07/2021, 8:19 AM Pager   If 7PM-7AM, please contact night-coverage [email or web address] Password [email or web address]

## 2021-08-07 NOTE — Telephone Encounter (Signed)
TOC per Dr. Audie Box, scheduled for 08/14/21 at 8:45am with Jory Sims.

## 2021-08-07 NOTE — Progress Notes (Signed)
Cardiology Progress Note  Patient ID: Emma Wilson MRN: [id number] DOB: 1956-04-22 Date of Encounter: 08/07/2021  Primary Cardiologist: Kirk Ruths, MD  Subjective   Chief Complaint: none.   HPI: Denies chest pain or trouble breathing.  Right radial cath site clean and dry.  ROS:  All other ROS reviewed and negative. Pertinent positives noted in the HPI.     Inpatient Medications  Scheduled Meds:  aspirin EC  81 mg Oral Daily   atorvastatin  80 mg Oral Daily   enoxaparin (LOVENOX) injection  40 mg Subcutaneous QHS   levothyroxine  137 mcg Oral QAC breakfast   nicotine  14 mg Transdermal Daily   sodium chloride flush  3 mL Intravenous Q12H   ticagrelor  90 mg Oral BID   Continuous Infusions:  sodium chloride     PRN Meds: sodium chloride, acetaminophen, albuterol, nitroGLYCERIN, ondansetron (ZOFRAN) IV, sodium chloride flush, traMADol   Vital Signs   Vitals:   08/06/21 1622 08/06/21 2011 08/06/21 2342 08/07/21 0703  BP: (!) 155/94 140/86 (!) 117/49 (!) 156/85  Pulse:  (!) 59 (!) 50 60  Resp: 18 15 16 18   Temp:  98.4 F (36.9 C) 98 F (36.7 C) 98.4 F (36.9 C)  TempSrc:  Oral Oral Oral  SpO2:  93% 95% 100%  Weight:      Height:        Intake/Output Summary (Last 24 hours) at 08/07/2021 0827 Last data filed at 08/06/2021 2045 Gross per 24 hour  Intake 623.67 ml  Output --  Net 623.67 ml   Last 3 Weights 08/05/2021 08/04/2021 08/03/2021  Weight (lbs) 124 lb 9 oz 128 lb 128 lb 6.4 oz  Weight (kg) 56.5 kg 58.06 kg 58.242 kg  Some encounter information is confidential and restricted. Go to Review Flowsheets activity to see all data.      Telemetry  Overnight telemetry shows sinus bradycardia heart rate in the 50s, which I personally reviewed.   ECG  The most recent ECG shows sinus bradycardia heart rate 44, no acute ischemic changes, which I personally reviewed.   Physical Exam   Vitals:   08/06/21 1622 08/06/21 2011 08/06/21 2342 08/07/21 0703  BP:  (!) 155/94 140/86 (!) 117/49 (!) 156/85  Pulse:  (!) 59 (!) 50 60  Resp: 18 15 16 18   Temp:  98.4 F (36.9 C) 98 F (36.7 C) 98.4 F (36.9 C)  TempSrc:  Oral Oral Oral  SpO2:  93% 95% 100%  Weight:      Height:        Intake/Output Summary (Last 24 hours) at 08/07/2021 0827 Last data filed at 08/06/2021 2045 Gross per 24 hour  Intake 623.67 ml  Output --  Net 623.67 ml    Last 3 Weights 08/05/2021 08/04/2021 08/03/2021  Weight (lbs) 124 lb 9 oz 128 lb 128 lb 6.4 oz  Weight (kg) 56.5 kg 58.06 kg 58.242 kg  Some encounter information is confidential and restricted. Go to Review Flowsheets activity to see all data.    Body mass index is 19.51 kg/m.  General: Well nourished, well developed, in no acute distress Head: Atraumatic, normal size  Eyes: PEERLA, EOMI  Neck: Supple, no JVD Endocrine: No thryomegaly Cardiac: Normal S1, S2; RRR; no murmurs, rubs, or gallops Lungs: Clear to auscultation bilaterally, no wheezing, rhonchi or rales  Abd: Soft, nontender, no hepatomegaly  Ext: No edema, pulses 2+, right radial cath site 2+ pulse Musculoskeletal: No deformities, BUE and BLE strength normal and  equal Skin: Warm and dry, no rashes   Neuro: Alert and oriented to person, place, time, and situation, CNII-XII grossly intact, no focal deficits  Psych: Normal mood and affect   Labs  High Sensitivity Troponin:   Recent Labs  Lab 08/04/21 1530 08/04/21 1920  TROPONINIHS <2 <2     Cardiac EnzymesNo results for input(s): TROPONINI in the last 168 hours. No results for input(s): TROPIPOC in the last 168 hours.  Chemistry Recent Labs  Lab 08/04/21 1530 08/05/21 0218 08/07/21 0115  NA 139 136 137  K 3.8 3.6 3.6  CL 102 103 108  CO2 27 24 23   GLUCOSE 87 86 86  BUN 14 10 8   CREATININE 0.78 0.74 0.82  CALCIUM 9.7 9.0 8.9  GFRNONAA >60 >60 >60  ANIONGAP 10 9 6     Hematology Recent Labs  Lab 08/04/21 1530 08/05/21 0218 08/07/21 0115  WBC 3.8* 3.6* 4.1  RBC 4.46 4.29 4.24   HGB 12.5 12.1 11.9*  HCT 36.3 34.6* 34.2*  MCV 81.4 80.7 80.7  MCH 28.0 28.2 28.1  MCHC 34.4 35.0 34.8  RDW 12.9 12.6 12.4  PLT 280 251 254   BNPNo results for input(s): BNP, PROBNP in the last 168 hours.  DDimer  Recent Labs  Lab 08/04/21 1920  DDIMER 0.86*     Radiology  CARDIAC CATHETERIZATION  Result Date: 08/06/2021   Mid LM to Dist LM lesion is 30% stenosed.   Ost LAD lesion is 50% stenosed.   Lesion #1: Prox LAD to Mid LAD lesion is 65% stenosed with 35% stenosed side branch in 2nd Diag.-FFR 0.73.   A drug-eluting stent was successfully placed using a SYNERGY XD 2.75X20.  Postdilated tapered fashion from 3.1 to 2.9 mm.  (Proximal optimization)   Post intervention, there is a 0% residual stenosis in the parent vessel, and the side branch stenosis was stable at 35% residual stenosis.   --------------------------------------------------------------   Lesion #2 and 3: Prox Cx-1 lesion is 35% stenosed with 60% stenosed side branch in LPAV (AVG).Prox Cx-2 lesion is 90% stenosed.  This was the culprit lesion noted on CT FFR   A drug-eluting stent was successfully placed crossing the sidebranch, using a SYNERGY XD 3.0X12.  Postdilated to 3.1 mm.   Balloon angioplasty was performed through the stent in the ostium of the LPAV/AVG branch, using a BALLN [place] EMERGE MR 2.5X8.   Post intervention, there is a 0% residual stenosis of the main channel LCx, and  the side branch was reduced to 30% residual stenosis in the sidebranch ostium.Marland Kitchen   ------------------------------------------------------------   Small to moderate caliber, yet dominant RCA tortuous, but no significant disease.   LV end diastolic pressure is normal.   There is no aortic valve stenosis. SUMMARY Severe two-vessel bifurcation CAD: Consistent with coronary CTA, 99% mid LCx at AVG takeoff -> Successful bifurcation DES PCI of mid LCx with angioplasty of ostial AVG: Synergy DES 3.0 m x 12 mm postdilated to 3.1 mm, PTCA of AVG sidebranch  using 2.5 mm [place] balloon.  (Residual stenosis and main branch 0%, and sidebranch 20% with TIMI-3 flow pre and post.) Ostial LAD 50% stenosis (FFR 0.85), proximal-mid LAD at 2nd Diag 65% irregular stenosis (FFR 0.73) Successful bifurcation PCI of proximal to mid LAD: Synergy DES 2.75 mm x 18 mm with tapered post elation from 3.1 to 2.8 mm. Normal LVEDP and normal EF by Echo. RECOMMENDATIONS Return to nursing for ongoing care TR band removal.  Post cath hydration. Continue to  titrate Aggressive Guideline Directed Medical Therapy for CAD. DAPT uninterrupted for 1 year, then reduce dose Brilinta versus Plavix 75 mg for second year. Expected discharge from cardiac cath-PCI standpoint by 08/07/2021 Case results discussed with patient's sister and son via telephone. Glenetta Hew, MD  CT CORONARY [facility] W/CTA COR W/SCORE Lewanda Rife W/CM &/OR WO/CM  Addendum Date: 08/06/2021   ADDENDUM REPORT: 08/06/2021 08:24 EXAM: OVER-READ INTERPRETATION  CT CHEST The following report is an over-read performed by radiologist Dr. Rebekah [facility] on 08/06/2021. This over-read does not include interpretation of cardiac or coronary anatomy or pathology. The coronary calcium score and cardiac CTA interpretation by the cardiologist is attached. COMPARISON:  Chest CTA 08/04/2021. FINDINGS: Aortic atherosclerosis. Diffuse bronchial wall thickening with moderate centrilobular and paraseptal emphysema. Extensive areas of the linear architectural distortion and volume loss in the lower lobes of the lungs bilaterally, likely reflect areas of chronic post infectious or inflammatory scarring. Within the visualized portions of the thorax there are no suspicious appearing pulmonary nodules or masses, there is no acute consolidative airspace disease, no pleural effusions, no pneumothorax and no lymphadenopathy. Visualized portions of the upper abdomen are unremarkable. There are no aggressive appearing lytic or blastic lesions noted  in the visualized portions of the skeleton. IMPRESSION: 1. Extensive post infectious or inflammatory scarring in the lower lobes of the lungs bilaterally. 2. Mild diffuse bronchial wall thickening with moderate centrilobular and paraseptal emphysema; imaging findings suggestive of underlying COPD. 3. Aortic atherosclerosis. Aortic Atherosclerosis (ICD10-I70.0) and Emphysema (ICD10-J43.9). Electronically Signed   By: Vinnie Langton M.D.   On: 08/06/2021 08:24   Addendum Date: 08/06/2021   ADDENDUM REPORT: 08/06/2021 07:47 Electronically Signed   By: Jenkins Rouge M.D.   On: 08/06/2021 07:47   Result Date: 08/06/2021 CLINICAL DATA:  Chest pain EXAM: Cardiac CTA MEDICATIONS: Sub lingual nitro.  4mg  TECHNIQUE: The patient was scanned on a Siemens Force 270 slice scanner. Gantry rotation speed was 250 msecs. Collimation was .6 mm. A 100 kV prospective scan was triggered in the ascending thoracic aorta at 140 HU's Full mA was used between 35% and 75% of the R-R interval. Average HR during the scan was 61 bpm. The 3D data set was interpreted on a dedicated work station using MPR, MIP and VRT modes. A total of 80 cc of contrast was used. FINDINGS: Non-cardiac: See separate report from [facility]. No significant findings on limited lung and soft tissue windows. Calcium Score: 3 vessel calcium noted Coronary Arteries: Right dominant with no anomalies LM: Normal LAD: 50-69% mixed plaque in mid vessel just before D2 take off D1: Normal D2: 25-49% mixed plaque in proximal vessel Circumflex: 25-49% mixed plaque mid circumflex at bifurcation of AV groove and OM1 RCA: Very tortuous with motion artifact in mid vessel 1-24% calcified plaque in proximal and distal vessel PDA: Normal PLA: Normal IMPRESSION: 1. Calcium score 72.8 which is 72 st percentile for age and sex 2.  Normal ascending thoracic aorta 3.1 cm 3. CAD RADS 3 CAD. Given mixed plaque nature of disease study will be sent for [facility] CT Patient would benefit  from intense statin Rx Jenkins Rouge Electronically Signed: By: Jenkins Rouge M.D. On: 08/06/2021 07:36   CT CORONARY FRACTIONAL FLOW RESERVE FLUID ANALYSIS  Result Date: 08/06/2021 CLINICAL DATA:  CAD EXAM: FFR CT TECHNIQUE: The best systolic and diastolic phases of the patients gated cardiac CTA sent to [facility] for hemodynamic analysis . FINDINGS: FFR CT positive in mid circumflex at OM1 take  off 0.59 Values normal elsewhere particularly mid LAD/D1 0.88 IMPRESSION: Positive FFR CT 0.59 in mid circumflex with mixed plaque In setting of acute chest pain syndrome Patient should be referred for cardiac catheterization Jenkins Rouge Electronically Signed   By: Jenkins Rouge M.D.   On: 08/06/2021 07:46   ECHOCARDIOGRAM COMPLETE  Result Date: 08/05/2021    ECHOCARDIOGRAM REPORT   Patient Name:   Emma Wilson Date of Exam: 08/05/2021 Medical Rec #:  [id number]        Height:       67.0 in Accession #:    [id number]       Weight:       124.6 lb Date of Birth:  Sep 11, 1956       BSA:          1.654 m Patient Age:    18 years         BP:           109/76 mmHg Patient Gender: F                HR:           49 bpm. Exam Location:  Inpatient Procedure: 2D Echo, Cardiac Doppler and Color Doppler Indications:    R07.9* Chest pain, unspecified  History:        Patient has prior history of Echocardiogram examinations, most                 recent 12/06/2013. COPD, Arrythmias:Bradycardia; Risk                 Factors:Dyslipidemia.  Sonographer:    Bernadene Person RDCS Referring Phys: [id number] [place]  1. Left ventricular ejection fraction, by estimation, is 55 to 60%. The left ventricle has normal function. The left ventricle has no regional wall motion abnormalities. Left ventricular diastolic parameters are consistent with Grade I diastolic dysfunction (impaired relaxation).  2. Right ventricular systolic function is normal. The right ventricular size is normal. There is normal pulmonary artery systolic  pressure. The estimated right ventricular systolic pressure is 30.8 mmHg.  3. The mitral valve is grossly normal. No evidence of mitral valve regurgitation. No evidence of mitral stenosis.  4. The aortic valve is tricuspid. There is mild calcification of the aortic valve. Aortic valve regurgitation is not visualized. No aortic stenosis is present.  5. The inferior vena cava is normal in size with greater than 50% respiratory variability, suggesting right atrial pressure of 3 mmHg.  6. Cannot exclude small patent foramen ovale. Redundant atrial septum. FINDINGS  Left Ventricle: Left ventricular ejection fraction, by estimation, is 55 to 60%. The left ventricle has normal function. The left ventricle has no regional wall motion abnormalities. The left ventricular internal cavity size was normal in size. There is  no left ventricular hypertrophy. Left ventricular diastolic parameters are consistent with Grade I diastolic dysfunction (impaired relaxation). Right Ventricle: The right ventricular size is normal. No increase in right ventricular wall thickness. Right ventricular systolic function is normal. There is normal pulmonary artery systolic pressure. The tricuspid regurgitant velocity is 2.15 m/s, and  with an assumed right atrial pressure of 3 mmHg, the estimated right ventricular systolic pressure is 65.7 mmHg. Left Atrium: Left atrial size was normal in size. Right Atrium: Right atrial size was normal in size. Pericardium: There is no evidence of pericardial effusion. Mitral Valve: The mitral valve is grossly normal. Mild mitral annular calcification. No evidence of mitral valve regurgitation. No evidence  of mitral valve stenosis. Tricuspid Valve: The tricuspid valve is normal in structure. Tricuspid valve regurgitation is mild . No evidence of tricuspid stenosis. Aortic Valve: The aortic valve is tricuspid. There is mild calcification of the aortic valve. Aortic valve regurgitation is not visualized. No aortic  stenosis is present. Pulmonic Valve: The pulmonic valve was not well visualized. Pulmonic valve regurgitation is trivial. No evidence of pulmonic stenosis. Aorta: The aortic root is normal in size and structure. Venous: The inferior vena cava is normal in size with greater than 50% respiratory variability, suggesting right atrial pressure of 3 mmHg. IAS/Shunts: There is redundancy of the interatrial septum. Cannot exclude small patent foramen ovale. Redundant atrial septum.  LEFT VENTRICLE PLAX 2D LVIDd:         4.10 cm  Diastology LVIDs:         2.80 cm  LV e' medial:    4.81 cm/s LV PW:         0.80 cm  LV E/e' medial:  10.0 LV IVS:        0.80 cm  LV e' lateral:   8.63 cm/s LVOT diam:     2.20 cm  LV E/e' lateral: 5.6 LV SV:         75 LV SV Index:   45 LVOT Area:     3.80 cm  RIGHT VENTRICLE RV S prime:     9.60 cm/s TAPSE (M-mode): 1.9 cm LEFT ATRIUM             Index       RIGHT ATRIUM           Index LA diam:        2.50 cm 1.51 cm/m  RA Area:     11.00 cm LA Vol (A2C):   39.6 ml 23.95 ml/m RA Volume:   25.00 ml  15.12 ml/m LA Vol (A4C):   38.2 ml 23.10 ml/m LA Biplane Vol: 39.6 ml 23.95 ml/m  AORTIC VALVE LVOT Vmax:   84.00 cm/s LVOT Vmean:  50.600 cm/s LVOT VTI:    0.196 m  AORTA Ao Root diam: 3.30 cm Ao Asc diam:  3.10 cm MITRAL VALVE               TRICUSPID VALVE MV Area (PHT): 2.50 cm    TR Peak grad:   18.5 mmHg MV Decel Time: 303 msec    TR Vmax:        215.00 cm/s MV E velocity: 48.30 cm/s MV A velocity: 74.40 cm/s  SHUNTS MV E/A ratio:  0.65        Systemic VTI:  0.20 m                            Systemic Diam: 2.20 cm Cherlynn Kaiser MD Electronically signed by Cherlynn Kaiser MD Signature Date/Time: 08/05/2021/11:58:29 AM    Final     Cardiac Studies  [facility] 08/06/2021 SUMMARY Severe two-vessel bifurcation CAD: Consistent with coronary CTA, 99% mid LCx at AVG takeoff ->  Successful bifurcation DES PCI of mid LCx with angioplasty of ostial AVG: Synergy DES 3.0 m x 12 mm postdilated to 3.1  mm,  PTCA of AVG sidebranch using 2.5 mm [place] balloon.  (Residual stenosis and main branch 0%, and sidebranch 20% with TIMI-3 flow pre and post.) Ostial LAD 50% stenosis (FFR 0.85), proximal-mid LAD at 2nd Diag 65% irregular stenosis (FFR 0.73) Successful bifurcation PCI of proximal to mid  LAD: Synergy DES 2.75 mm x 18 mm with tapered post elation from 3.1 to 2.8 mm.  Normal LVEDP and normal EF by Echo.   RECOMMENDATIONS Return to nursing for ongoing care TR band removal.  Post cath hydration. Continue to titrate Aggressive Guideline Directed Medical Therapy for CAD. DAPT uninterrupted for 1 year, then reduce dose Brilinta versus Plavix 75 mg for second year. Expected discharge from cardiac cath-PCI standpoint by 08/07/2021   TTE 08/05/2021  1. Left ventricular ejection fraction, by estimation, is 55 to 60%. The  left ventricle has normal function. The left ventricle has no regional  wall motion abnormalities. Left ventricular diastolic parameters are  consistent with Grade I diastolic  dysfunction (impaired relaxation).   2. Right ventricular systolic function is normal. The right ventricular  size is normal. There is normal pulmonary artery systolic pressure. The  estimated right ventricular systolic pressure is 42.5 mmHg.   3. The mitral valve is grossly normal. No evidence of mitral valve  regurgitation. No evidence of mitral stenosis.   4. The aortic valve is tricuspid. There is mild calcification of the  aortic valve. Aortic valve regurgitation is not visualized. No aortic  stenosis is present.   5. The inferior vena cava is normal in size with greater than 50%  respiratory variability, suggesting right atrial pressure of 3 mmHg.   6. Cannot exclude small patent foramen ovale. Redundant atrial septum.   Patient Profile  65 year old female with history of tobacco abuse, COPD, hypothyroidism who was admitted on 08/05/2021 with unstable angina.  Assessment & Plan   #Unstable  angina -Status post PCI to OM1 in the mid LAD. -Continue aspirin and Brilinta.  Plan for 1 year of DAPT. -No beta-blocker given bradycardia. -Would discharge home on Lipitor 80 mg daily. -Smoking cessation was recommended. -She is stable for discharge.  Right radial cath site without evidence of hematoma or bruising.  She was given strict return precautions.  She was also given activity restrictions over the next 1 week.  EKG unremarkable.  She seems to be doing well.  [facility] will sign off.   Medication Recommendations: Medications as above Other recommendations (labs, testing, etc): None Follow up as an outpatient: We will have her follow-up in 7 to 10 days in our office.  She has an appointment in early October but we will expedite this.  For questions or updates, please contact [place] Please consult [email or web address] for contact info under   Time Spent with Patient: I have spent a total of 25 minutes with patient reviewing hospital notes, telemetry, EKGs, labs and examining the patient as well as establishing an assessment and plan that was discussed with the patient.  > 50% of time was spent in direct patient care.    Signed, Addison Naegeli. Audie Box, MD, [place]  08/07/2021 8:27 AM

## 2021-08-07 NOTE — Care Management (Signed)
08-07-21 [id number] Case Manager received notification from the [facility] Pharmacist that insurance is not covering the [place]. Patient will receive the 30 day free supply from [place]; however, the patient will need to be switched to Plavix thereafter.

## 2021-08-07 NOTE — Progress Notes (Signed)
CARDIAC REHAB PHASE I   PRE:  Rate/Rhythm: 52 SB  BP:  Supine: 137/77  Sitting:   Standing:    SaO2: 97%RA  MODE:  Ambulation: 425 ft   POST:  Rate/Rhythm: 86 SR  BP:  Supine:   Sitting: 153/87  Standing:    SaO2: 99%RA 0840-0929 Pt walked 425 ft with steady gait and tolerated well. No CP. Discussed importance of brilinta with stent. Reviewed NTG use, heart healthy diet given , walking for ex (pt is going to start dance in a couple of weeks), smoking cessation and CRP 2. Pt has quit smoking in the past on her own. Knows benefit of tobacco cessation. Referred to [place] program.   Graylon Good, RN BSN  08/07/2021 9:49 AM

## 2021-08-11 NOTE — Telephone Encounter (Signed)
  Patient contacted regarding discharge from [place] on 08/07/21.  Patient understands to follow up with provider Jory Sims NP  on 08/14/21 at 8:45AM at [facility]. Patient understands discharge instructions? YES Patient understands medications and regiment? PT WAS ASKING ABOUT  NTG SCRIPT WILL DISCUSS AT APPT ON Friday  Patient understands to bring all medications to this visit? YES

## 2021-08-12 ENCOUNTER — Telehealth (HOSPITAL_COMMUNITY): Payer: Self-pay

## 2021-08-12 NOTE — Telephone Encounter (Signed)
Called patient to see if she is interested in the Cardiac Rehab Program. Patient expressed interest. Explained scheduling process and went over insurance, patient verbalized understanding. Will contact patient for scheduling once f/u has been completed. 

## 2021-08-13 NOTE — Progress Notes (Signed)
Cardiology Office Note   Date:  08/14/2021   ID:  Emma Wilson, DOB Jan 18, 1956, MRN [id number]  PCP:  Merrilee Seashore, MD  Cardiologist: Dr. Stanford Breed No chief complaint on file.    History of Present Illness: Emma Wilson is a 65 y.o. female who presents for posthospitalization follow-up with a history of COPD, hyperlipidemia, hypothyroidism, who was admitted for chest pain which has been ongoing for several days prior to visit with PCP.  When seen by PCP she was sent to the emergency room for further evaluation.  CT scan was completed of the chest which revealed severe OM1 lesion.  Although cardiac markers were negative and twelve-lead EKG was normal showing no signs of ischemia cardiology was consulted.  Patient had a cardiac catheterization dated 08/06/2021 which revealed severe two-vessel bifurcation CAD with 99% mid circumflex at the AVG takeoff and ostial LAD 50% stenosis, proximal to mid LAD at the second diagonal is a 65% irregular stenosis.  The patient underwent a successful bifurcation DES PCI of the mid circumflex with angioplasty of the ostial AVG using Synergy DES 3.1 x 12 mm postdilated to 3.1 mm.  Patient also had PTCA of AVG sidebranch using 2.5 mm Hubbard balloon (with residual stenosis and main branch 0%, and sidebranch of 20% with TIMI-3 flow pre and post).   The patient also had successful bifurcation PCI of the proximal to mid LAD using a Synergy DES 2.75 x 18 mm with tapered post elevation for 3.1 to 2.8 mm.  She was found to have normal LVEDP and normal EF by echo.  She was was recommended for dual antiplatelet therapy for 1 year and high intensity statin.  Therefore she was just charged on ticagrelor 90 mg twice daily, atorvastatin 80 mg daily, aspirin 81 mg daily.  She was to continue medications for COPD, and hypothyroidism.  She is here for TOC follow-up.  Of note, total cholesterol was 202, HDL 45, LDL 138, triglycerides 93, with VLDL of 19 on admission.  She comes  today with questions concerning her activities and whether or not she can travel to [facility] in a couple of weeks.  She does have concerns whether or not her insurance company will pay for [place] she is waiting to hear back.  She denies any hemoptysis, melena, or hematuria.  She is medically compliant.  She does work in an area where she is lifting 50 to 70 pound boxes.  She requests a letter to allow her to return to work on October 17 so that she can resume full activities.   Past Medical History:  Diagnosis Date   Abnormality of gait 03/19/2014   one leg shorter than other"uses cane frequently"   Carpal tunnel syndrome    right wrist   Chronic back pain    DDD   COPD (chronic obstructive pulmonary disease) ([place])    Depression 2006   "after my son passed" (12/05/2013)   Fibromyalgia    GERD (gastroesophageal reflux disease)    Hematuria    microscopic hematuria (urol & nephrol w/u neg), multiple UTI`s, glomerular from Sickle Trait, Dr. Jeffie Pollock 2014   Hyperlipidemia    managed by Dr. Ouida Sills   Hypothyroidism    Leg length inequality    sees rheumatology   Migraine    "q 3-4 months" (12/05/2013)   Osteoarthritis    Rheumatoid and OA?  Dr. Titus Mould ([facility], rheumatology)   Pneumonia    "abuntantly as a child"   Rheumatoid arthritis ([place])  Dr. Titus Mould   Shortness of breath    "at rest" (12/05/2013)   Sinus bradycardia    Thyroid cancer [facility])    S/P radiation & OR    Past Surgical History:  Procedure Laterality Date   COLONOSCOPY  10/13/2010   external and internal hemorrhoids, repeat 2021; Dr. Clarene Essex   CORONARY BALLOON ANGIOPLASTY N/A 08/06/2021   Procedure: CORONARY BALLOON ANGIOPLASTY;  Surgeon: Leonie Man, MD;  Location: [place] [facility];  Service: Cardiovascular;  Laterality: N/A;   CORONARY STENT INTERVENTION N/A 08/06/2021   Procedure: CORONARY STENT INTERVENTION;  Surgeon: Leonie Man, MD;  Location: [place] [facility];  Service: Cardiovascular;   Laterality: N/A;   CYSTOSCOPY  02/26/2013   Dr. Jeffie Pollock (normal; follicular cystitis)   EUS N/A 03/18/2016   Procedure: UPPER ENDOSCOPIC ULTRASOUND (EUS) RADIAL;  Surgeon: Milus Banister, MD;  Location: [facility];  Service: Endoscopy;  Laterality: N/A;   INTRAVASCULAR PRESSURE WIRE/FFR STUDY N/A 08/06/2021   Procedure: INTRAVASCULAR PRESSURE WIRE/FFR STUDY;  Surgeon: Leonie Man, MD;  Location: [place] [facility];  Service: Cardiovascular;  Laterality: N/A;   LEFT HEART CATH AND CORONARY ANGIOGRAPHY N/A 08/06/2021   Procedure: LEFT HEART CATH AND CORONARY ANGIOGRAPHY;  Surgeon: Leonie Man, MD;  Location: [place] [facility];  Service: Cardiovascular;  Laterality: N/A;   THYROIDECTOMY, PARTIAL  11/2005; 11/2006   TOTAL ABDOMINAL HYSTERECTOMY  ~ 1990   due to bleeding   TUBAL LIGATION  1980's     Current Outpatient Medications  Medication Sig Dispense Refill   aspirin EC 81 MG tablet Take 81 mg by mouth daily. Swallow whole.     diclofenac sodium (VOLTAREN) 1 % GEL Apply 1 application topically 3 (three) times daily as needed (pain).     Polyvinyl Alcohol-Povidone (REFRESH OP) Place 1 drop into both eyes daily as needed (dry eyes).     pregabalin (LYRICA) 50 MG capsule Take 50 mg by mouth daily.     rosuvastatin (CRESTOR) 40 MG tablet Take 1 tablet (40 mg total) by mouth daily. 30 tablet 2   SYNTHROID 137 MCG tablet Take 1 tablet (137 mcg total) by mouth daily before breakfast. 90 tablet 1   ticagrelor (BRILINTA) 90 MG TABS tablet Take 1 tablet (90 mg total) by mouth 2 (two) times daily. 60 tablet 3   tiZANidine (ZANAFLEX) 4 MG tablet Take 4 mg by mouth 2 (two) times daily as needed for muscle spasms.  2   traMADol (ULTRAM) 50 MG tablet Take 50 mg by mouth 3 (three) times daily as needed for moderate pain.     No current facility-administered medications for this visit.    Allergies:   Patient has no known allergies.    Social History:  The patient  reports that she has been  smoking cigarettes. She has a 17.50 pack-year smoking history. She has never used smokeless tobacco. She reports current alcohol use. She reports that she does not use drugs.   Family History:  The patient's family history includes Arthritis in her sister and sister; Cancer in her father; Coronary artery disease in her mother; Diabetes in her son; Fibromyalgia in her sister; Heart disease in her mother; Kidney disease in her mother; Stroke in her mother.    ROS: All other systems are reviewed and negative. Unless otherwise mentioned in H&P    PHYSICAL EXAM: VS:  BP 126/70 (BP Location: Left Arm, Patient Position: Sitting, Cuff Size: Normal)   Pulse (!) 57  Ht 5\' 7"  (1.702 m)   Wt 129 lb (58.5 kg)   SpO2 97%   BMI 20.20 kg/m  , BMI Body mass index is 20.2 kg/m. GEN: Well nourished, well developed, in no acute distress HEENT: normal Neck: no JVD, carotid bruits, or masses Cardiac: RRR; no murmurs, rubs, or gallops,no edema  Respiratory:  Clear to auscultation bilaterally, normal work of breathing GI: soft, nontender, nondistended, + BS MS: no deformity or atrophy, right wrist well-healed at catheter insertion site without hematoma or pain. Skin: warm and dry, no rash Neuro:  Strength and sensation are intact Psych: euthymic mood, full affect   EKG: Sinus bradycardia heart rate 57 bpm with nonspecific T wave abnormalities noted (personally reviewed)  Recent Labs: 08/07/2021: BUN 8; Creatinine, Ser 0.82; Hemoglobin 11.9; Platelets 254; Potassium 3.6; Sodium 137    Lipid Panel    Component Value Date/Time   CHOL 202 (H) 08/05/2021 0218   TRIG 93 08/05/2021 0218   HDL 45 08/05/2021 0218   CHOLHDL 4.5 08/05/2021 0218   VLDL 19 08/05/2021 0218   LDLCALC 138 (H) 08/05/2021 0218      Wt Readings from Last 3 Encounters:  08/14/21 129 lb (58.5 kg)  08/05/21 124 lb 9 oz (56.5 kg)  08/03/21 128 lb 6.4 oz (58.2 kg)      Other studies Reviewed: SUMMARY Cardiac Cath  08/06/2021 Severe two-vessel bifurcation CAD: Consistent with coronary CTA, 99% mid LCx at AVG takeoff ->  Successful bifurcation DES PCI of mid LCx with angioplasty of ostial AVG: Synergy DES 3.0 m x 12 mm postdilated to 3.1 mm,  PTCA of AVG sidebranch using 2.5 mm [place] balloon.  (Residual stenosis and main branch 0%, and sidebranch 20% with TIMI-3 flow pre and post.) Ostial LAD 50% stenosis (FFR 0.85), proximal-mid LAD at 2nd Diag 65% irregular stenosis (FFR 0.73) Successful bifurcation PCI of proximal to mid LAD: Synergy DES 2.75 mm x 18 mm with tapered post elation from 3.1 to 2.8 mm.  Normal LVEDP and normal EF by Echo.   RECOMMENDATIONS Return to nursing for ongoing care TR band removal.  Post cath hydration. Continue to titrate Aggressive Guideline Directed Medical Therapy for CAD. DAPT uninterrupted for 1 year, then reduce dose Brilinta versus Plavix 75 mg for second year. Expected discharge from cardiac cath-PCI standpoint by 08/07/2021   Case results discussed with patient's sister and son via telephone.    ASSESSMENT AND PLAN:  1.  Coronary artery disease: She is status post two-vessel PCI of the mid left circumflex, and proximal mid LAD, along with PTCA of the AVG side branching.  She is been without recurrent angina.  She is medically compliant.  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  She is waiting to hear back next week.  I have advised her that if your insurance does not cover Brilinta, we will change her to Plavix.  We will wait to hear from her concerning insurance company decision.  For now she will continue her current regimen of dual antiplatelet therapy and statin therapy.  I have provided her a letter to return to work on August 31, 2021 without restrictions as she does lift heavy boxes greater than 50 pounds at her job.  She is also able to fly to [place] at the end of October without restrictions.  I have provided her education concerning her  cardiac catheterization, stent placement, and medical management post procedure.  She verbalizes understanding.  She will return to see Dr. Stanford Breed  in 3 months with a BMET, and a CBC.  2.  Hyperlipidemia: Goal of LDL less than 70 in this patient with known CAD.  She is on atorvastatin 40 mg daily.  Initial labs during hospitalization had an LDL level of 138.  We will recheck lipids and LFTs in 3 months prior to next office visit.   Current medicines are reviewed at length with the patient today.  I have spent 25 minutes dedicated to the care of this patient on the date of this encounter to include pre-visit review of records, assessment, management and diagnostic testing,with shared decision making.  Labs/ tests ordered today include:CBC, BMET, Lipids and LFTs in 3 months,   Phill Myron. West Pugh, ANP, AACC   08/14/2021 9:03 AM    [facility] [address] Office [phone] Fax [phone]  Notice: This dictation was prepared with Dragon dictation along with smaller phrase technology. Any transcriptional errors that result from this process are unintentional and may not be corrected upon review.

## 2021-08-14 ENCOUNTER — Ambulatory Visit (INDEPENDENT_AMBULATORY_CARE_PROVIDER_SITE_OTHER): Payer: 59 | Admitting: Adult Health

## 2021-08-14 ENCOUNTER — Other Ambulatory Visit: Payer: Self-pay

## 2021-08-14 ENCOUNTER — Encounter: Payer: Self-pay | Admitting: Adult Health

## 2021-08-14 VITALS — BP 126/70 | HR 57 | Ht 67.0 in | Wt 129.0 lb

## 2021-08-14 DIAGNOSIS — M797 Fibromyalgia: Secondary | ICD-10-CM | POA: Diagnosis not present

## 2021-08-14 DIAGNOSIS — E7800 Pure hypercholesterolemia, unspecified: Secondary | ICD-10-CM

## 2021-08-14 DIAGNOSIS — I25110 Atherosclerotic heart disease of native coronary artery with unstable angina pectoris: Secondary | ICD-10-CM

## 2021-08-14 NOTE — Patient Instructions (Signed)
Medication Instructions:  The current medical regimen is effective;  continue present plan and medications as directed. Please refer to the Current Medication list given to you today.  *If you need a refill on your cardiac medications before your next appointment, please call your pharmacy*  Lab Work:    FASTING LIPID, LFT, BMET AND CBC BEFORE FOLLOW UP APPOINTMENT IN 3 MONTHS  Special Instructions LET us KNOW ABOUT YOUR BRILINTA   RETURN TO WORK 08-31-21 LETTER [PROVIDED  Follow-Up: Your next appointment:  3 month(s) In Person with Kirk Ruths, MD   At [facility], you and your health needs are our priority.  As part of our continuing mission to provide you with exceptional heart care, we have created designated Provider Care Teams.  These Care Teams include your primary Cardiologist (physician) and Advanced Practice Providers (APPs -  Physician Assistants and Nurse Practitioners) who all work together to provide you with the care you need, when you need it.

## 2021-08-24 ENCOUNTER — Other Ambulatory Visit (HOSPITAL_COMMUNITY): Payer: Self-pay

## 2021-08-24 ENCOUNTER — Telehealth (HOSPITAL_COMMUNITY): Payer: Self-pay

## 2021-08-24 NOTE — Telephone Encounter (Signed)
Pharmacy Transitions of Care Follow-up Telephone Call  Date of discharge: 08/07/21  Discharge Diagnosis: post stent placement  How have you been since you were released from the hospital?  Patient doing well, no questions about meds at this time.  Medication changes made at discharge: START taking: Brilinta (ticagrelor)  rosuvastatin (Crestor)   Medication changes verified by the patient? Yes    Medication Accessibility:  Home Pharmacy:  [facility] [address] [place]  Was the patient provided with refills on discharged medications? Yes   Have all prescriptions been transferred from [facility] to home pharmacy?  yes  Is the patient able to afford medications? Has insurance    Medication Review:  TICAGRELOR (BRILINTA) Ticagrelor 90 mg BID initiated on 08/07/21.  - Educated patient on expected duration of therapy of aspirin with ticagrelor.  - Discussed importance of taking medication around the same time every day, - Advised patient of medications to avoid (NSAIDs, aspirin maintenance doses>100 mg daily) - Educated that Tylenol (acetaminophen) will be the preferred analgesic to prevent risk of bleeding  - Emphasized importance of monitoring for signs and symptoms of bleeding (abnormal bruising, prolonged bleeding, nose bleeds, bleeding from gums, discolored urine, black tarry stools)  - Educated patient to notify doctor if shortness of breath or abnormal heartbeat occur - Advised patient to alert all providers of antiplatelet therapy prior to starting a new medication or having a procedure   Follow-up Appointments:  PCP Hospital f/u appt confirmed? None currently scheduled  Specialist Hospital f/u appt confirmed? Scheduled to see Dr. Purcell Nails on 08/14/21 @ Cardiology.   If their condition worsens, is the pt aware to call PCP or go to the Emergency Dept.? yes  Final Patient Assessment: Patient has follow up scheduled and refills at home pharmacy

## 2021-08-27 ENCOUNTER — Ambulatory Visit: Payer: 59 | Admitting: Physician Assistant

## 2021-09-02 ENCOUNTER — Telehealth: Payer: Self-pay | Admitting: Cardiology

## 2021-09-02 NOTE — Telephone Encounter (Signed)
Spoke with pt, Aware of dr crenshaw's recommendations.  °

## 2021-09-02 NOTE — Telephone Encounter (Signed)
Patient wants to know if Dr. Stanford Breed thinks if she should go part-time or retire.  She went back full-time and she feels it's too much for her.

## 2021-09-23 ENCOUNTER — Other Ambulatory Visit (HOSPITAL_COMMUNITY): Payer: Self-pay

## 2021-09-24 ENCOUNTER — Telehealth (HOSPITAL_COMMUNITY): Payer: Self-pay

## 2021-09-24 NOTE — Telephone Encounter (Signed)
3 slots were open for cardiac rehab. Attempted to call pt to see if she wanted to get in, [facility] advised pt on the VM that the slots are going to go pretty quick and that there was no guarantee that there would be any availability once she calls back.

## 2021-10-05 ENCOUNTER — Encounter (HOSPITAL_COMMUNITY): Payer: Self-pay

## 2021-10-05 NOTE — Telephone Encounter (Signed)
Attempted to call patient in regards to Cardiac Rehab - LM on VM Mailed letter 

## 2021-10-23 ENCOUNTER — Telehealth (HOSPITAL_COMMUNITY): Payer: Self-pay

## 2021-10-23 NOTE — Telephone Encounter (Signed)
No repsonse from pt.  Closed referral  

## 2021-12-30 NOTE — Progress Notes (Signed)
HPI: FU CAD. ABIs 9/17 normal.  Seen for a syncopal episode April 2018.  Event monitor 5/18 showed sinus with PVC.  Echocardiogram September 2022 showed normal LV function, grade 1 diastolic dysfunction.  Cardiac CTA September 2022 suggested significant circumflex disease.  Cardiac catheterization September 2022 showed 30% distal left main, 50% ostial LAD, 65% proximal to mid LAD which was treated with drug-eluting stent, 90% circumflex stenosis which was also treated with drug-eluting stent and 60% LPAV treated with angioplasty.  Since last seen, she denies exertional chest pain.  She denies dyspnea, palpitations or syncope.  Current Outpatient Medications  Medication Sig Dispense Refill   aspirin EC 81 MG tablet Take 81 mg by mouth daily. Swallow whole.     diclofenac sodium (VOLTAREN) 1 % GEL Apply 1 application topically 3 (three) times daily as needed (pain).     Polyvinyl Alcohol-Povidone (REFRESH OP) Place 1 drop into both eyes daily as needed (dry eyes).     pregabalin (LYRICA) 50 MG capsule Take 50 mg by mouth daily.     SYNTHROID 137 MCG tablet Take 1 tablet (137 mcg total) by mouth daily before breakfast. 90 tablet 1   ticagrelor (BRILINTA) 90 MG TABS tablet Take 1 tablet (90 mg total) by mouth 2 (two) times daily. 60 tablet 3   tiZANidine (ZANAFLEX) 4 MG tablet Take 4 mg by mouth 2 (two) times daily as needed for muscle spasms.  2   traMADol (ULTRAM) 50 MG tablet Take 50 mg by mouth 3 (three) times daily as needed for moderate pain.     rosuvastatin (CRESTOR) 40 MG tablet Take 1 tablet (40 mg total) by mouth daily. 30 tablet 2   No current facility-administered medications for this visit.     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 Jeffie Pollock 2014   Hyperlipidemia    managed by Dr. Ouida Sills   Hypothyroidism    Leg length inequality    sees rheumatology   Migraine    "q 3-4 months" (12/05/2013)   Osteoarthritis    Rheumatoid and OA?  Dr. Titus Mould ([facility], rheumatology)   Pneumonia    "abuntantly as a child"   Rheumatoid arthritis ([place])    Dr. Titus Mould   Shortness of breath    "at rest" (12/05/2013)   Sinus bradycardia    Thyroid cancer ([place])    S/P radiation & OR    Past Surgical History:  Procedure Laterality Date   COLONOSCOPY  10/13/2010   external and internal hemorrhoids, repeat 2021; Dr. Clarene Essex   CORONARY BALLOON ANGIOPLASTY N/A 08/06/2021   Procedure: CORONARY BALLOON ANGIOPLASTY;  Surgeon: Leonie Man, MD;  Location: [place] [facility];  Service: Cardiovascular;  Laterality: N/A;   CORONARY STENT INTERVENTION N/A 08/06/2021   Procedure: CORONARY STENT INTERVENTION;  Surgeon: Leonie Man, MD;  Location: [place] [facility];  Service: Cardiovascular;  Laterality: N/A;   CYSTOSCOPY  02/26/2013   Dr. Jeffie Pollock (normal; follicular cystitis)   EUS N/A  03/18/2016   Procedure: UPPER ENDOSCOPIC ULTRASOUND (EUS) RADIAL;  Surgeon: Milus Banister, MD;  [facility];  Service: Endoscopy;  Laterality: N/A;   INTRAVASCULAR PRESSURE WIRE/FFR STUDY N/A 08/06/2021   Procedure: INTRAVASCULAR PRESSURE WIRE/FFR STUDY;  Surgeon: Leonie Man, MD;  Location: [place] [facility];  Service: Cardiovascular;  Laterality: N/A;   LEFT HEART CATH AND CORONARY ANGIOGRAPHY N/A 08/06/2021   Procedure: LEFT HEART CATH AND CORONARY ANGIOGRAPHY;  Surgeon: Leonie Man, MD;  Location: [place] [facility];  Service: Cardiovascular;  Laterality: N/A;   THYROIDECTOMY, PARTIAL  11/2005; 11/2006   TOTAL ABDOMINAL HYSTERECTOMY  ~ 1990   due  to bleeding   TUBAL LIGATION  1980's    Social History   Socioeconomic History   Marital status: Divorced    Spouse name: Not on file   Number of children: 3   Years of education: college   Highest education level: Not on file  Occupational History   Occupation: Armed forces operational officer: [employer]  Tobacco Use   Smoking status: Every Day    Packs/day: 0.50    Years: 35.00    Pack years: 17.50    Types: Cigarettes   Smokeless tobacco: Never  Substance and Sexual Activity   Alcohol use: Yes    Alcohol/week: 0.0 standard drinks    Comment: once to twice a week    Drug use: No   Sexual activity: Not Currently    Partners: Male  Other Topics Concern   Not on file  Social History Narrative   Lives alone.  2 living children (1 deceased), 1 in [place], 1 in [place], 6 grandchildren.  Works at [employer].   Exercise some.  As of 07/2016   Social Determinants of Health   Financial Resource Strain: Not on file  Food Insecurity: Not on file  Transportation Needs: Not on file  Physical Activity: Not on file  Stress: Not on file  Social Connections: Not on file  Intimate Partner Violence: Not on file    Family History  Problem Relation Age of Onset   Stroke Mother    Coronary artery disease Mother    Kidney disease Mother        on dialysis   Heart disease Mother    Cancer Father        multiple myeloma   Arthritis Sister        rheumatoid and osteoarthritis   Fibromyalgia Sister    Arthritis Sister        RA and OA   Diabetes Son     ROS: no fevers or chills, productive cough, hemoptysis, dysphasia, odynophagia, melena, hematochezia, dysuria, hematuria, rash, seizure activity, orthopnea, PND, pedal edema, claudication. Remaining systems are negative.  Physical Exam: Well-developed well-nourished in no acute distress.  Skin is warm and dry.  HEENT is normal.  Neck is supple.  Chest is clear to auscultation with normal expansion.  Cardiovascular exam is  regular rate and rhythm.  Abdominal exam nontender or distended. No masses palpated. Extremities show no edema. neuro grossly intact  ECG- personally reviewed  A/P  1 coronary artery disease-continue aspirin, Brilinta and statin.  2 hyperlipidemia-continue statin.  Check lipids and liver.  3 palpitations-symptoms are controlled.  We can consider a beta-blocker in future if necessary.  4 history of syncope-no recurrences.  5 tobacco abuse-patient again counseled on discontinuing.  Kirk Ruths, MD

## 2022-01-01 ENCOUNTER — Other Ambulatory Visit (HOSPITAL_COMMUNITY): Payer: Self-pay

## 2022-01-11 ENCOUNTER — Ambulatory Visit (INDEPENDENT_AMBULATORY_CARE_PROVIDER_SITE_OTHER): Payer: 59 | Admitting: Cardiology

## 2022-01-11 ENCOUNTER — Other Ambulatory Visit: Payer: Self-pay

## 2022-01-11 ENCOUNTER — Encounter: Payer: Self-pay | Admitting: Cardiology

## 2022-01-11 VITALS — BP 130/77 | HR 72 | Ht 67.0 in | Wt 125.0 lb

## 2022-01-11 DIAGNOSIS — R002 Palpitations: Secondary | ICD-10-CM | POA: Diagnosis not present

## 2022-01-11 DIAGNOSIS — I25110 Atherosclerotic heart disease of native coronary artery with unstable angina pectoris: Secondary | ICD-10-CM | POA: Diagnosis not present

## 2022-01-11 DIAGNOSIS — E7800 Pure hypercholesterolemia, unspecified: Secondary | ICD-10-CM | POA: Diagnosis not present

## 2022-01-11 DIAGNOSIS — F17200 Nicotine dependence, unspecified, uncomplicated: Secondary | ICD-10-CM

## 2022-01-11 LAB — HEPATIC FUNCTION PANEL
ALT: 7 IU/L (ref 0–32)
AST: 12 IU/L (ref 0–40)
Albumin: 4.4 g/dL (ref 3.8–4.8)
Alkaline Phosphatase: 102 IU/L (ref 44–121)
Bilirubin Total: 0.4 mg/dL (ref 0.0–1.2)
Bilirubin, Direct: 0.1 mg/dL (ref 0.00–0.40)
Total Protein: 7.4 g/dL (ref 6.0–8.5)

## 2022-01-11 LAB — LIPID PANEL
Chol/HDL Ratio: 3.9 ratio (ref 0.0–4.4)
Cholesterol, Total: 270 mg/dL — ABNORMAL HIGH (ref 100–199)
HDL: 70 mg/dL (ref >39)
LDL Chol Calc (NIH): 178 mg/dL — ABNORMAL HIGH (ref 0–99)
Triglycerides: 127 mg/dL (ref 0–149)
VLDL Cholesterol Cal: 22 mg/dL (ref 5–40)

## 2022-01-11 MED ORDER — NICOTINE 7 MG/24HR TD PT24: 7.0000 mg | MEDICATED_PATCH | Freq: Every day | TRANSDERMAL | 0 refills | Status: AC

## 2022-01-11 MED ORDER — NICOTINE 14 MG/24HR TD PT24: 14.0000 mg | MEDICATED_PATCH | Freq: Every day | TRANSDERMAL | 0 refills | Status: DC

## 2022-01-11 NOTE — Patient Instructions (Signed)

## 2022-01-11 NOTE — Addendum Note (Signed)
Addended by: Cristopher Estimable on: 01/11/2022 09:20 AM   Modules accepted: Orders

## 2022-01-18 ENCOUNTER — Telehealth: Payer: Self-pay | Admitting: Cardiology

## 2022-01-18 DIAGNOSIS — Z79899 Other long term (current) drug therapy: Secondary | ICD-10-CM

## 2022-01-18 DIAGNOSIS — E7800 Pure hypercholesterolemia, unspecified: Secondary | ICD-10-CM

## 2022-01-18 NOTE — Telephone Encounter (Signed)
Make sure she is taking crestor 40 mg daily; add zetia 10 mg daily; lipids and liver 8 weeks  ?Kirk Ruths  ?Please let me know if you are taking the rosuvastatin. ? ?Called pt and she states that she does not use mychart. She originally filled rx but she lost the rx so she recently re-started on 3-1. She will come in fasting in may for fasting lab. Future lab entered ? ?

## 2022-01-18 NOTE — Telephone Encounter (Signed)
Patient returned call for lab results.  

## 2022-02-09 ENCOUNTER — Ambulatory Visit (HOSPITAL_COMMUNITY)
Admission: EM | Admit: 2022-02-09 | Discharge: 2022-02-09 | Disposition: A | Discharge status: Home / Self Care | Payer: 59 | Attending: Family Medicine | Admitting: Family Medicine

## 2022-02-09 ENCOUNTER — Encounter (HOSPITAL_COMMUNITY): Payer: Self-pay

## 2022-02-09 ENCOUNTER — Ambulatory Visit (INDEPENDENT_AMBULATORY_CARE_PROVIDER_SITE_OTHER): Payer: 59

## 2022-02-09 DIAGNOSIS — R1031 Right lower quadrant pain: Secondary | ICD-10-CM | POA: Diagnosis not present

## 2022-02-09 DIAGNOSIS — R3129 Other microscopic hematuria: Secondary | ICD-10-CM | POA: Insufficient documentation

## 2022-02-09 DIAGNOSIS — R102 Pelvic and perineal pain: Secondary | ICD-10-CM | POA: Diagnosis not present

## 2022-02-09 DIAGNOSIS — R1030 Lower abdominal pain, unspecified: Secondary | ICD-10-CM | POA: Diagnosis not present

## 2022-02-09 LAB — POCT URINALYSIS DIPSTICK, ED / UC
Bilirubin Urine: NEGATIVE
Glucose, UA: NEGATIVE mg/dL
Ketones, ur: NEGATIVE mg/dL
Leukocytes,Ua: NEGATIVE
Nitrite: NEGATIVE
Protein, ur: NEGATIVE mg/dL
Specific Gravity, Urine: <=1.005 (ref 1.005–1.030)
Urobilinogen, UA: 0.2 mg/dL (ref 0.0–1.0)
pH: 5.5 (ref 5.0–8.0)

## 2022-02-09 NOTE — ED Triage Notes (Signed)
Pt c/o rt groin pain since Saturday. Denies injury. States pain worse walking up steps. ?

## 2022-02-09 NOTE — Discharge Instructions (Signed)
You were seen today for right groin pain.  You have tenderness in the lower abdomen and left groin as well.   ?Your xray did show arthritis, but nothing else concerning.  ?Your urine does have blood in it.  Apparently this is not new for you, but you should follow up with a primary care provider about this.  ?You should also see a primary care provider to discuss the groin pain as well.  You may find one at [email or web address]  ?Please go to the ER if you have worsening pain, or other new/changing symptoms such has nausea/vomiting, or fever.  ?

## 2022-02-09 NOTE — ED Provider Notes (Signed)
?[facility] ? ? ? ?CSN: [id number] ?Arrival date & time: 02/09/22  1135 ? ? ?  ? ?History   ?Chief Complaint ?Chief Complaint  ?Patient presents with  ? Groin Pain  ? ? ?HPI ?Emma Wilson is a 66 y.o. female.  ? ?Patient is here for right groin pain x 5 days.  Saturday she stood up and had right groin pain.  Thought something pulled.  She continues to have pain while walking.  ?She did not lift anything.  Did not turn funny or anything.  ?Pain does not radiate.  ?No urinary symptoms.  Eating and drinking normally.  She does have IBS, but that has not changed.  ?She takes tramadol anyway, but did not anything extra.  ? ?Past Medical History:  ?Diagnosis Date  ? Abnormality of gait 03/19/2014  ? one leg shorter than other"uses cane frequently"  ? Carpal tunnel syndrome   ? right wrist  ? Chronic back pain   ? DDD  ? COPD (chronic obstructive pulmonary disease) ([place])   ? Depression 2006  ? "after my son passed" (12/05/2013)  ? Fibromyalgia   ? GERD (gastroesophageal reflux disease)   ? Hematuria   ? 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 Jeffie Pollock 2014  ? Hyperlipidemia   ? managed by Dr. Ouida Sills  ? Hypothyroidism   ? Leg length inequality   ? sees rheumatology  ? Migraine   ? "q 3-4 months" (12/05/2013)  ? Osteoarthritis   ? Rheumatoid and OA?  Dr. Titus Mould [facility], rheumatology)  ? Pneumonia   ? "abuntantly as a child"  ? Rheumatoid arthritis ([place])   ? Dr. Titus Mould  ? Shortness of breath   ? "at rest" (12/05/2013)  ? Sinus bradycardia   ? Thyroid cancer ([place])   ? S/P radiation & OR  ? ? ?Patient Active Problem List  ? Diagnosis Date Noted  ? Unstable angina ([place]) 08/06/2021  ? Abnormal cardiac CT angiography   ? Coronary artery disease involving native coronary artery of native heart with unstable angina pectoris ([place])   ? Chest pain 08/04/2021  ? Asymptomatic microscopic hematuria 06/26/2017  ? History of kidney stones 05/24/2017  ? History of thyroid  cancer 05/24/2017  ? Encounter for health maintenance examination in adult 07/26/2016  ? Need for prophylactic vaccination and inoculation against influenza 07/26/2016  ? History of COPD 07/26/2016  ? Hereditary and idiopathic peripheral neuropathy 07/26/2016  ? Decreased radial pulse 07/26/2016  ? Decreased pulses in feet 07/26/2016  ? Abnormality of gait 03/19/2014  ? Pure hypercholesterolemia 01/24/2013  ? Hematuria, unspecified 01/24/2013  ? Fibromyalgia 01/24/2013  ? Bradycardia 03/25/2011  ? TOBACCO ABUSE 02/12/2010  ? Hypothyroidism 02/11/2010  ? COPD (chronic obstructive pulmonary disease) ([place]) 02/11/2010  ? GERD 02/11/2010  ? ? ?Past Surgical History:  ?Procedure Laterality Date  ? COLONOSCOPY  10/13/2010  ? external and internal hemorrhoids, repeat 2021; Dr. Clarene Essex  ? CORONARY BALLOON ANGIOPLASTY N/A 08/06/2021  ? Procedure: CORONARY BALLOON ANGIOPLASTY;  Surgeon: Leonie Man, MD;  Location: [facility];  Service: Cardiovascular;  Laterality: N/A;  ? CORONARY STENT INTERVENTION N/A 08/06/2021  ? Procedure: CORONARY STENT INTERVENTION;  Surgeon: Leonie Man, MD;  Location: [facility];  Service: Cardiovascular;  Laterality: N/A;  ? CYSTOSCOPY  02/26/2013  ? Dr. Jeffie Pollock (normal; follicular cystitis)  ? EUS N/A 03/18/2016  ? Procedure: UPPER ENDOSCOPIC ULTRASOUND (EUS) RADIAL;  Surgeon: Melene Plan  Ardis Hughs, MD;  Location: [facility];  Service: Endoscopy;  Laterality: N/A;  ? INTRAVASCULAR PRESSURE WIRE/FFR STUDY N/A 08/06/2021  ? Procedure: INTRAVASCULAR PRESSURE WIRE/FFR STUDY;  Surgeon: Leonie Man, MD;  Location: [place];  Service: Cardiovascular;  Laterality: N/A;  ? LEFT HEART CATH AND CORONARY ANGIOGRAPHY N/A 08/06/2021  ? Procedure: LEFT HEART CATH AND CORONARY ANGIOGRAPHY;  Surgeon: Leonie Man, MD;  Location: [facility];  Service: Cardiovascular;  Laterality: N/A;  ? THYROIDECTOMY, PARTIAL  11/2005; 11/2006  ? TOTAL ABDOMINAL HYSTERECTOMY  ~ 1990  ? due to  bleeding  ? TUBAL LIGATION  1980's  ? ? ?OB History   ?No obstetric history on file. ?  ? ? ? ?Home Medications   ? ?Prior to Admission medications   ?Medication Sig Start Date End Date Taking? Authorizing Provider  ?aspirin EC 81 MG tablet Take 81 mg by mouth daily. Swallow whole.    [provider]  ?diclofenac sodium (VOLTAREN) 1 % GEL Apply 1 application topically 3 (three) times daily as needed (pain).    [provider]  ?nicotine (NICODERM CQ) 14 mg/24hr patch Place 1 patch (14 mg total) onto the skin daily. 01/11/22   Lelon Perla, MD  ?nicotine (NICODERM CQ) 7 mg/24hr patch Place 1 patch (7 mg total) onto the skin daily. 01/11/22   Lelon Perla, MD  ?Polyvinyl Alcohol-Povidone (REFRESH OP) Place 1 drop into both eyes daily as needed (dry eyes).    [provider]  ?pregabalin (LYRICA) 50 MG capsule Take 50 mg by mouth daily.    [provider]  ?rosuvastatin (CRESTOR) 40 MG tablet Take 1 tablet (40 mg total) by mouth daily. 08/07/21 09/06/21  Charlynne Cousins, MD  ?SYNTHROID 137 MCG tablet Take 1 tablet (137 mcg total) by mouth daily before breakfast. 07/27/16   Tysinger, Camelia Eng, PA-C  ?ticagrelor (BRILINTA) 90 MG TABS tablet Take 1 tablet (90 mg total) by mouth 2 (two) times daily. 08/07/21   Charlynne Cousins, MD  ?tiZANidine (ZANAFLEX) 4 MG tablet Take 4 mg by mouth 2 (two) times daily as needed for muscle spasms. 08/17/18   [provider]  ?traMADol (ULTRAM) 50 MG tablet Take 50 mg by mouth 3 (three) times daily as needed for moderate pain. 07/08/21   [provider]  ? ? ?Family History ?Family History  ?Problem Relation Age of Onset  ? Stroke Mother   ? Coronary artery disease Mother   ? Kidney disease Mother   ?     on dialysis  ? Heart disease Mother   ? Cancer Father   ?     multiple myeloma  ? Arthritis Sister   ?     rheumatoid and osteoarthritis  ? Fibromyalgia Sister   ? Arthritis Sister   ?     RA and OA  ? Diabetes Son    ? ? ?Social History ?Social History  ? ?Tobacco Use  ? Smoking status: Every Day  ?  Packs/day: 0.50  ?  Years: 35.00  ?  Pack years: 17.50  ?  Types: Cigarettes  ? Smokeless tobacco: Never  ?Substance Use Topics  ? Alcohol use: Yes  ?  Alcohol/week: 0.0 standard drinks  ?  Comment: once to twice a week   ? Drug use: No  ? ? ? ?Allergies   ?Patient has no known allergies. ? ? ?Review of Systems ?Review of Systems  ?Constitutional: Negative.   ?HENT: Negative.    ?  Respiratory: Negative.    ?Cardiovascular: Negative.   ?Gastrointestinal: Negative.   ?Genitourinary: Negative.   ?Musculoskeletal:  Positive for arthralgias and myalgias.  ? ? ?Physical Exam ?Triage Vital Signs ?ED Triage Vitals  ?Enc Vitals Group  ?   BP 02/09/22 1159 (!) 145/82  ?   Pulse Rate 02/09/22 1159 61  ?   Resp 02/09/22 1159 18  ?   Temp 02/09/22 1159 97.8 ?F (36.6 ?C)  ?   Temp Source 02/09/22 1159 Oral  ?   SpO2 02/09/22 1159 97 %  ?   Weight --   ?   Height --   ?   Head Circumference --   ?   Peak Flow --   ?   Pain Score 02/09/22 1200 3  ?   Pain Loc --   ?   Pain Edu? --   ?   Excl. in [place]? --   ? ?No data found. ? ?Updated Vital Signs ?BP (!) 145/82 (BP Location: Left Arm)   Pulse 61   Temp 97.8 ?F (36.6 ?C) (Oral)   Resp 18   SpO2 97%  ? ?Visual Acuity ?Right Eye Distance:   ?Left Eye Distance:   ?Bilateral Distance:   ? ?Right Eye Near:   ?Left Eye Near:    ?Bilateral Near:    ? ?Physical Exam ?Constitutional:   ?   Appearance: Normal appearance.  ?Cardiovascular:  ?   Rate and Rhythm: Normal rate and regular rhythm.  ?Pulmonary:  ?   Effort: Pulmonary effort is normal.  ?   Breath sounds: Normal breath sounds.  ?Abdominal:  ?   Palpations: Abdomen is soft.  ?   Tenderness: There is abdominal tenderness in the right lower quadrant, suprapubic area and left lower quadrant.  ?   Comments: TTP at the groins bilaterally;  no obvious swelling or enlarged Lns noted;    ?Musculoskeletal:  ?   Comments: TTP at the lateral hips bilaterally;   full rom of the hips bilaterally without limitations;  slight discomfort noted with full rotations  ?Neurological:  ?   Mental Status: She is alert.  ? ? ? ?UC Treatments / Results  ?Labs ?(all labs ordered are l

## 2022-02-10 LAB — URINE CULTURE: Culture: NO GROWTH

## 2022-03-13 ENCOUNTER — Encounter (HOSPITAL_COMMUNITY): Payer: Self-pay | Admitting: Emergency Medicine

## 2022-03-13 ENCOUNTER — Emergency Department (HOSPITAL_COMMUNITY)
Admission: EM | Admit: 2022-03-13 | Discharge: 2022-03-13 | Disposition: A | Discharge status: Home / Self Care | Payer: 59 | Attending: Emergency Medicine | Admitting: Emergency Medicine

## 2022-03-13 ENCOUNTER — Emergency Department (HOSPITAL_COMMUNITY): Payer: 59

## 2022-03-13 ENCOUNTER — Other Ambulatory Visit: Payer: Self-pay

## 2022-03-13 DIAGNOSIS — R072 Precordial pain: Secondary | ICD-10-CM | POA: Insufficient documentation

## 2022-03-13 DIAGNOSIS — I2510 Atherosclerotic heart disease of native coronary artery without angina pectoris: Secondary | ICD-10-CM | POA: Diagnosis not present

## 2022-03-13 DIAGNOSIS — E039 Hypothyroidism, unspecified: Secondary | ICD-10-CM | POA: Insufficient documentation

## 2022-03-13 DIAGNOSIS — J449 Chronic obstructive pulmonary disease, unspecified: Secondary | ICD-10-CM | POA: Diagnosis not present

## 2022-03-13 DIAGNOSIS — Z7982 Long term (current) use of aspirin: Secondary | ICD-10-CM | POA: Insufficient documentation

## 2022-03-13 DIAGNOSIS — R079 Chest pain, unspecified: Secondary | ICD-10-CM

## 2022-03-13 LAB — HEPATIC FUNCTION PANEL
ALT: 14 U/L (ref 0–44)
AST: 17 U/L (ref 15–41)
Albumin: 3.7 g/dL (ref 3.5–5.0)
Alkaline Phosphatase: 77 U/L (ref 38–126)
Bilirubin, Direct: <0.1 mg/dL (ref 0.0–0.2)
Total Bilirubin: 0.4 mg/dL (ref 0.3–1.2)
Total Protein: 7.6 g/dL (ref 6.5–8.1)

## 2022-03-13 LAB — CBC WITH DIFFERENTIAL/PLATELET
Abs Immature Granulocytes: 0.01 10*3/uL (ref 0.00–0.07)
Basophils Absolute: 0.1 10*3/uL (ref 0.0–0.1)
Basophils Relative: 1 %
Eosinophils Absolute: 0.2 10*3/uL (ref 0.0–0.5)
Eosinophils Relative: 4 %
HCT: 35.5 % — ABNORMAL LOW (ref 36.0–46.0)
Hemoglobin: 12.1 g/dL (ref 12.0–15.0)
Immature Granulocytes: 0 %
Lymphocytes Relative: 29 %
Lymphs Abs: 1.3 10*3/uL (ref 0.7–4.0)
MCH: 27.7 pg (ref 26.0–34.0)
MCHC: 34.1 g/dL (ref 30.0–36.0)
MCV: 81.2 fL (ref 80.0–100.0)
Monocytes Absolute: 0.6 10*3/uL (ref 0.1–1.0)
Monocytes Relative: 12 %
Neutro Abs: 2.4 10*3/uL (ref 1.7–7.7)
Neutrophils Relative %: 54 %
Platelets: 263 10*3/uL (ref 150–400)
RBC: 4.37 MIL/uL (ref 3.87–5.11)
RDW: 13.7 % (ref 11.5–15.5)
WBC: 4.5 10*3/uL (ref 4.0–10.5)
nRBC: 0 % (ref 0.0–0.2)

## 2022-03-13 LAB — BASIC METABOLIC PANEL
Anion gap: 7 (ref 5–15)
BUN: 14 mg/dL (ref 8–23)
CO2: 23 mmol/L (ref 22–32)
Calcium: 9.1 mg/dL (ref 8.9–10.3)
Chloride: 110 mmol/L (ref 98–111)
Creatinine, Ser: 0.62 mg/dL (ref 0.44–1.00)
GFR, Estimated: >60 mL/min (ref >60)
Glucose, Bld: 124 mg/dL — ABNORMAL HIGH (ref 70–99)
Potassium: 3.7 mmol/L (ref 3.5–5.1)
Sodium: 140 mmol/L (ref 135–145)

## 2022-03-13 LAB — TROPONIN I (HIGH SENSITIVITY)
Troponin I (High Sensitivity): <2 ng/L (ref <18)
Troponin I (High Sensitivity): <2 ng/L (ref <18)

## 2022-03-13 LAB — MAGNESIUM: Magnesium: 2.1 mg/dL (ref 1.7–2.4)

## 2022-03-13 LAB — LIPASE, BLOOD: Lipase: 40 U/L (ref 11–51)

## 2022-03-13 MED ORDER — LIDOCAINE VISCOUS HCL 2 % MT SOLN
15.0000 mL | Freq: Once | OROMUCOSAL | Status: AC
  Administered 2022-03-13: 15 mL via ORAL
  Filled 2022-03-13: qty 15

## 2022-03-13 MED ORDER — ALUM & MAG HYDROXIDE-SIMETH 200-200-20 MG/5ML PO SUSP
30.0000 mL | Freq: Once | ORAL | Status: AC
  Administered 2022-03-13: 30 mL via ORAL
  Filled 2022-03-13: qty 30

## 2022-03-13 MED ORDER — FAMOTIDINE IN NACL 20-0.9 MG/50ML-% IV SOLN
20.0000 mg | Freq: Once | INTRAVENOUS | Status: AC
  Administered 2022-03-13: 20 mg via INTRAVENOUS
  Filled 2022-03-13: qty 50

## 2022-03-13 MED ORDER — FAMOTIDINE 20 MG PO TABS: 20.0000 mg | ORAL_TABLET | Freq: Two times a day (BID) | ORAL | 0 refills | Status: DC

## 2022-03-13 NOTE — ED Provider Triage Note (Signed)
Emergency Medicine Provider Triage Evaluation Note ? ?Emma Wilson , a 66 y.o. female  was evaluated in triage.  Pt complains of chest pain.  Began earlier today.  Associated shortness of breath.  Did state pain is intermittent, pain earlier today she got "sweaty."  No radiation.  No emesis.  No swelling to lower legs.  Had stents placed in September.  Thinks this might feel similar to when she needed stents placed ? ?Review of Systems  ?Positive: Chest pain ?Negative: Neck pain, numbness, weakness, lower extremity swelling ? ?Physical Exam  ?There were no vitals taken for this visit. ?Gen:   Awake, no distress   ?Resp:  Normal effort  ?MSK:   Moves extremities without difficulty  ?Other:   ? ?Medical Decision Making  ?Medically screening exam initiated at 8:22 PM.  Appropriate orders placed.  Emma Wilson was informed that the remainder of the evaluation will be completed by another provider, this initial triage assessment does not replace that evaluation, and the importance of remaining in the ED until their evaluation is complete. ? ?CP ?  ?Farhaan Mabee A, PA-C ?03/13/22 2023 ? ?

## 2022-03-13 NOTE — Discharge Instructions (Signed)
Call Dr. Jacalyn Lefevre office to set up a follow-up appointment.  There is a prescription for Pepcid sent to your pharmacy.  This helps with stomach acid and reflux.  If you feel that this medication helps you, talk to your primary care doctor about long-term usage.  If you develop any new or worsening symptoms, please return to the emergency department. ?

## 2022-03-13 NOTE — ED Provider Notes (Signed)
?[facility] DEPT ?Provider Note ? ? ?CSN: [id number] ?Arrival date & time: 03/13/22  2012 ? ?  ? ?History ? ?Chief Complaint  ?Patient presents with  ? Chest Pain  ? ? ?Emma Wilson is a 66 y.o. female. ? ? ?Chest Pain ?Associated symptoms: diaphoresis (Resolved)   ?Patient presents for chest pain.  She has history of CAD and underwent placement of 2 cardiac stents last year.  Her cardiologist is Dr. Stanford Breed.  Other medical history includes hypothyroidism, COPD, GERD, HLD, fibromyalgia.  She takes daily aspirin and Brilinta.  She took her doses today.  This morning she was doing her normal routine at home.  She developed a substernal chest pressure.  She had not eaten yet, but she did drink coffee.  She ate something and rested.  Pain was persistent throughout the day.  At maximum it was 8/10 in severity.  Currently it is 4/10.  She did have an episode of diaphoresis earlier today.  She did not have any other associated symptoms. ?  ? ?Home Medications ?Prior to Admission medications   ?Medication Sig Start Date End Date Taking? Authorizing Provider  ?aspirin EC 81 MG tablet Take 81 mg by mouth daily. Swallow whole.   Yes [provider]  ?diclofenac sodium (VOLTAREN) 1 % GEL Apply 1 application topically 3 (three) times daily as needed (pain).   Yes [provider]  ?famotidine (PEPCID) 20 MG tablet Take 1 tablet (20 mg total) by mouth 2 (two) times daily. 03/13/22  Yes Godfrey Pick, MD  ?nicotine (NICODERM CQ) 14 mg/24hr patch Place 1 patch (14 mg total) onto the skin daily. ?Patient taking differently: Place 14 mg onto the skin daily as needed (smoking cessation). 01/11/22  Yes Lelon Perla, MD  ?OVER THE COUNTER MEDICATION Apply 1 application. topically daily as needed (pain). Cannabidiol   Yes [provider]  ?Polyvinyl Alcohol-Povidone (REFRESH OP) Place 1 drop into both eyes daily as needed (dry eyes).   Yes [provider]  ?pregabalin  (LYRICA) 25 MG capsule Take 25 mg by mouth 3 (three) times daily as needed (fibromyalgia).   Yes [provider]  ?rosuvastatin (CRESTOR) 40 MG tablet Take 40 mg by mouth daily.   Yes [provider]  ?SYNTHROID 137 MCG tablet Take 1 tablet (137 mcg total) by mouth daily before breakfast. 07/27/16  Yes Tysinger, Camelia Eng, PA-C  ?ticagrelor (BRILINTA) 90 MG TABS tablet Take 1 tablet (90 mg total) by mouth 2 (two) times daily. 08/07/21  Yes Charlynne Cousins, MD  ?tiZANidine (ZANAFLEX) 4 MG tablet Take 4 mg by mouth 2 (two) times daily as needed for muscle spasms. 08/17/18  Yes [provider]  ?traMADol (ULTRAM) 50 MG tablet Take 50 mg by mouth 3 (three) times daily as needed for moderate pain. 07/08/21  Yes [provider]  ?nicotine (NICODERM CQ) 7 mg/24hr patch Place 1 patch (7 mg total) onto the skin daily. 01/11/22   Lelon Perla, MD  ?rosuvastatin (CRESTOR) 40 MG tablet Take 1 tablet (40 mg total) by mouth daily. 08/07/21 09/06/21  Charlynne Cousins, MD  ?   ? ?Allergies    ?Patient has no known allergies.   ? ?Review of Systems   ?Review of Systems  ?Constitutional:  Positive for diaphoresis (Resolved).  ?Cardiovascular:  Positive for chest pain.  ?All other systems reviewed and are negative. ? ?Physical Exam ?Updated Vital Signs ?BP 119/64   Pulse (!) 54   Temp 97.8 ?  F (36.6 ?C) (Oral)   Resp 19   SpO2 95%  ?Physical Exam ?Vitals and nursing note reviewed.  ?Constitutional:   ?   General: She is not in acute distress. ?   Appearance: She is well-developed and normal weight. She is not ill-appearing, toxic-appearing or diaphoretic.  ?HENT:  ?   Head: Normocephalic and atraumatic.  ?Eyes:  ?   Extraocular Movements: Extraocular movements intact.  ?   Conjunctiva/sclera: Conjunctivae normal.  ?Neck:  ?   Vascular: No JVD.  ?Cardiovascular:  ?   Rate and Rhythm: Normal rate and regular rhythm.  ?   Heart sounds: Normal heart sounds. No murmur heard. ?Pulmonary:  ?    Effort: Pulmonary effort is normal. No tachypnea or respiratory distress.  ?   Breath sounds: Normal breath sounds. No decreased breath sounds, wheezing, rhonchi or rales.  ?Chest:  ?   Chest wall: No deformity or tenderness.  ?Abdominal:  ?   Palpations: Abdomen is soft.  ?   Tenderness: There is no abdominal tenderness.  ?Musculoskeletal:     ?   General: No swelling. Normal range of motion.  ?   Cervical back: Normal range of motion and neck supple.  ?   Right lower leg: No edema.  ?   Left lower leg: No edema.  ?Skin: ?   General: Skin is warm and dry.  ?   Capillary Refill: Capillary refill takes less than 2 seconds.  ?   Coloration: Skin is not cyanotic or pale.  ?Neurological:  ?   General: No focal deficit present.  ?   Mental Status: She is alert and oriented to person, place, and time.  ?Psychiatric:     ?   Mood and Affect: Mood normal.     ?   Behavior: Behavior normal.  ? ? ?ED Results / Procedures / Treatments   ?Labs ?(all labs ordered are listed, but only abnormal results are displayed) ?Labs Reviewed  ?CBC WITH DIFFERENTIAL/PLATELET - Abnormal; Notable for the following components:  ?    Result Value  ? HCT 35.5 (*)   ? All other components within normal limits  ?BASIC METABOLIC PANEL - Abnormal; Notable for the following components:  ? Glucose, Bld 124 (*)   ? All other components within normal limits  ?MAGNESIUM  ?HEPATIC FUNCTION PANEL  ?LIPASE, BLOOD  ?TROPONIN I (HIGH SENSITIVITY)  ?TROPONIN I (HIGH SENSITIVITY)  ? ? ?EKG ?EKG Interpretation ? ?Date/Time:  Saturday March 13 2022 20:29:42 EDT ?Ventricular Rate:  60 ?PR Interval:  158 ?QRS Duration: 82 ?QT Interval:  396 ?QTC Calculation: 396 ?R Axis:   70 ?Text Interpretation: Sinus rhythm Biatrial enlargement Abnormal R-wave progression, early transition Confirmed by Godfrey Pick [phone]) on 03/13/2022 8:58:55 PM ? ?Radiology ?DG Chest 2 View ? ?Result Date: 03/13/2022 ?CLINICAL DATA:  Acute chest pain EXAM: CHEST - 2 VIEW COMPARISON:  08/04/2021  radiograph and prior studies FINDINGS: The cardiomediastinal silhouette is unremarkable. Emphysema changes again identified. There is no evidence of focal airspace disease, pulmonary edema, suspicious pulmonary nodule/mass, pleural effusion, or pneumothorax. No acute bony abnormalities are identified. IMPRESSION: Emphysema without evidence of acute cardiopulmonary disease. Electronically Signed   By: Margarette Canada M.D.   On: 03/13/2022 20:42   ? ?Procedures ?Procedures  ? ? ?Medications Ordered in ED ?Medications  ?alum & mag hydroxide-simeth (MAALOX/MYLANTA) 200-200-20 MG/5ML suspension 30 mL (30 mLs Oral Given 03/13/22 2213)  ?  And  ?lidocaine (XYLOCAINE) 2 % viscous mouth solution 15 mL (  15 mLs Oral Given 03/13/22 2214)  ?famotidine (PEPCID) IVPB 20 mg premix (0 mg Intravenous Stopped 03/13/22 2316)  ? ? ?ED Course/ Medical Decision Making/ A&P ?  ?                        ?Medical Decision Making ?Amount and/or Complexity of Data Reviewed ?Labs: ordered. ? ?Risk ?OTC drugs. ?Prescription drug management. ? ? ?This patient presents to the ED for concern of chest pain, this involves an extensive number of treatment options, and is a complaint that carries with it a high risk of complications and morbidity.  The differential diagnosis includes ACS, GERD, gastritis, pneumonia, PE, pericarditis, pneumomediastinum ? ? ?Co morbidities that complicate the patient evaluation ? ?hypothyroidism, COPD, GERD, HLD, fibromyalgia ? ? ?Additional history obtained: ? ?Additional history obtained from N/A ?External records from outside source obtained and reviewed including EMR ? ? ?Lab Tests: ? ?I Ordered, and personally interpreted labs.  The pertinent results include: Normal findings, including troponins ? ? ?Imaging Studies ordered: ? ?I ordered imaging studies including chest x-ray ?I independently visualized and interpreted imaging which showed no acute findings ?I agree with the radiologist interpretation ? ? ?Cardiac  Monitoring: / EKG: ? ?The patient was maintained on a cardiac monitor.  I personally viewed and interpreted the cardiac monitored which showed an underlying rhythm of: Sinus rhythm ? ?Problem List / ED Course / Critical

## 2022-03-13 NOTE — ED Triage Notes (Signed)
Pt reports chest pain starting this morning. States that she thought it was heartburn, but it has persisted. Also reports some diaphoresis earlier today with the pain. Denies radiation or back pain. Had 2 stents placed in Sept of last year. Sees Dr. Stanford Breed.  ?

## 2022-03-17 ENCOUNTER — Ambulatory Visit (INDEPENDENT_AMBULATORY_CARE_PROVIDER_SITE_OTHER): Payer: 59 | Admitting: Physician Assistant

## 2022-03-17 ENCOUNTER — Encounter: Payer: Self-pay | Admitting: Physician Assistant

## 2022-03-17 VITALS — BP 104/62 | HR 58 | Ht 68.0 in | Wt 124.2 lb

## 2022-03-17 DIAGNOSIS — E7800 Pure hypercholesterolemia, unspecified: Secondary | ICD-10-CM

## 2022-03-17 DIAGNOSIS — I25110 Atherosclerotic heart disease of native coronary artery with unstable angina pectoris: Secondary | ICD-10-CM | POA: Diagnosis not present

## 2022-03-17 DIAGNOSIS — E785 Hyperlipidemia, unspecified: Secondary | ICD-10-CM

## 2022-03-17 DIAGNOSIS — R079 Chest pain, unspecified: Secondary | ICD-10-CM

## 2022-03-17 MED ORDER — EZETIMIBE 10 MG PO TABS: 10.0000 mg | ORAL_TABLET | Freq: Every day | ORAL | 3 refills | Status: AC

## 2022-03-17 NOTE — Progress Notes (Signed)
?Cardiology Office Note:   ? ?Date:  03/17/2022  ? ?ID:  Emma Wilson, DOB 09-Nov-1956, MRN [id number] ? ?PCP:  Merrilee Seashore, MD ?  ?[facility] Providers ?Cardiologist:  Kirk Ruths, MD ?Cardiology APP:  Ledora Bottcher, [place] { ?Referring MD: Merrilee Seashore, MD  ? ?Chief Complaint  ?Patient presents with  ? Follow-up  ?  Chest pain  ? ? ?History of Present Illness:   ? ?Emma Wilson is a 66 y.o. female with a hx of CAD, syncope, PVCs, thyroid disease, COPD, and hyperlipidemia. She was hospitalized Sept 2022 with chest pain with negative CE and nonischemic EKG. CT coronary was obtained and revealed mid CX stenosis with FFR 0.59. Heart catheterization the following day revealed severe 2-vessel bifurcation disease with 99% mid Cx at he AVG takeoff and ostial LAD 50% stenosis, p-m LAD at D2 with 65% stenosis, and 30% LM. She was treated with DES to proximal-mid LAD. She was also treated with DES to mid LCX with POBA through the stent in the ostium of AVG. She was started on DAPT.  She was last seen by Dr. Stanford Breed 01/11/22 and was doing well at that time. Since then, she has been seen in the ER twice.  ? ?On 02/09/22: seen for groin pain, conservative treatment, no admission ?On 03/13/22: seen for chest pain. She ruled out with negative CE and nonischemic EKG. CP resolved with GI cocktail. She was discharged without admission and with new pepcid.  ? ?She presents for ER follow up. She described the chest pain for 2 days of a epigastric and across anterior chest. She ate at [employer] [facility] on Friday, and [facility] on Sat and [employer] [facility] on Sunday for early mother's day celebrations. She does eat out every day because she lives alone. She has been taking the Pepcid since discharge and has not had a recurrence of chest pain.  ? ? ?Past Medical History:  ?Diagnosis Date  ? Abnormality of gait 03/19/2014  ? one leg shorter than other"uses cane frequently"  ? Carpal tunnel syndrome   ? right wrist  ?  Chronic back pain   ? DDD  ? COPD (chronic obstructive pulmonary disease) ([place])   ? Depression 2006  ? "after my son passed" (12/05/2013)  ? Fibromyalgia   ? GERD (gastroesophageal reflux disease)   ? Hematuria   ? 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 Jeffie Pollock 2014  ? Hyperlipidemia   ? managed by Dr. Ouida Sills  ? Hypothyroidism   ? Leg length inequality   ? sees rheumatology  ? Migraine   ? "q 3-4 months" (12/05/2013)  ? Osteoarthritis   ? Rheumatoid and OA?  Dr. Titus Mould [facility], rheumatology)  ? Pneumonia   ? "abuntantly as a child"  ? Rheumatoid arthritis ([place])   ? Dr. Titus Mould  ? Shortness of breath   ? "at rest" (12/05/2013)  ? Sinus bradycardia   ? Thyroid cancer ([place])   ? S/P radiation & OR  ? ? ?Past Surgical History:  ?Procedure Laterality Date  ? COLONOSCOPY  10/13/2010  ? external and internal hemorrhoids, repeat 2021; Dr. Clarene Essex  ? CORONARY BALLOON ANGIOPLASTY N/A 08/06/2021  ? Procedure: CORONARY BALLOON ANGIOPLASTY;  Surgeon: Leonie Man, MD;  Location: [facility];  Service: Cardiovascular;  Laterality: N/A;  ? CORONARY STENT INTERVENTION N/A 08/06/2021  ? Procedure: CORONARY STENT INTERVENTION;  Surgeon: Leonie Man, MD;  Location: [facility];  Service: Cardiovascular;  Laterality: N/A;  ? CYSTOSCOPY  02/26/2013  ? Dr. Jeffie Pollock (normal; follicular cystitis)  ? EUS N/A 03/18/2016  ? Procedure: UPPER ENDOSCOPIC ULTRASOUND (EUS) RADIAL;  Surgeon: Milus Banister, MD;  Location: [facility];  Service: Endoscopy;  Laterality: N/A;  ? INTRAVASCULAR PRESSURE WIRE/FFR STUDY N/A 08/06/2021  ? Procedure: INTRAVASCULAR PRESSURE WIRE/FFR STUDY;  Surgeon: Leonie Man, MD;  Location: [facility];  Service: Cardiovascular;  Laterality: N/A;  ? LEFT HEART CATH AND CORONARY ANGIOGRAPHY N/A 08/06/2021  ? Procedure: LEFT HEART CATH AND CORONARY ANGIOGRAPHY;  Surgeon: Leonie Man, MD;  Location: [facility];  Service:  Cardiovascular;  Laterality: N/A;  ? THYROIDECTOMY, PARTIAL  11/2005; 11/2006  ? TOTAL ABDOMINAL HYSTERECTOMY  ~ 1990  ? due to bleeding  ? TUBAL LIGATION  1980's  ? ? ?Current Medications: ?Current Meds  ?Medication Sig  ? aspirin EC 81 MG tablet Take 81 mg by mouth daily. Swallow whole.  ? diclofenac sodium (VOLTAREN) 1 % GEL Apply 1 application topically 3 (three) times daily as needed (pain).  ? ezetimibe (ZETIA) 10 MG tablet Take 1 tablet (10 mg total) by mouth daily.  ? famotidine (PEPCID) 20 MG tablet Take 1 tablet (20 mg total) by mouth 2 (two) times daily.  ? nicotine (NICODERM CQ) 14 mg/24hr patch Place 1 patch (14 mg total) onto the skin daily.  ? nicotine (NICODERM CQ) 7 mg/24hr patch Place 1 patch (7 mg total) onto the skin daily.  ? OVER THE COUNTER MEDICATION Apply 1 application. topically daily as needed (pain). Cannabidiol  ? Polyvinyl Alcohol-Povidone (REFRESH OP) Place 1 drop into both eyes daily as needed (dry eyes).  ? pregabalin (LYRICA) 25 MG capsule Take 25 mg by mouth 3 (three) times daily as needed (fibromyalgia).  ? rosuvastatin (CRESTOR) 40 MG tablet Take 40 mg by mouth daily.  ? SYNTHROID 137 MCG tablet Take 1 tablet (137 mcg total) by mouth daily before breakfast.  ? ticagrelor (BRILINTA) 90 MG TABS tablet Take 1 tablet (90 mg total) by mouth 2 (two) times daily.  ? tiZANidine (ZANAFLEX) 4 MG tablet Take 4 mg by mouth 2 (two) times daily as needed for muscle spasms.  ? traMADol (ULTRAM) 50 MG tablet Take 50 mg by mouth 3 (three) times daily as needed for moderate pain.  ?  ? ?Allergies:   Patient has no known allergies.  ? ?Social History  ? ?Socioeconomic History  ? Marital status: Divorced  ?  Spouse name: Not on file  ? Number of children: 3  ? Years of education: college  ? Highest education Wilson: Not on file  ?Occupational History  ? Occupation: clerk  ?  Employer: [employer]  ?Tobacco Use  ? Smoking status: Every Day  ?  Packs/day: 0.50  ?  Years: 35.00  ?  Pack years: 17.50  ?   Types: Cigarettes  ? Smokeless tobacco: Never  ?Substance and Sexual Activity  ? Alcohol use: Yes  ?  Alcohol/week: 0.0 standard drinks  ?  Comment: once to twice a week   ? Drug use: No  ? Sexual activity: Not Currently  ?  Partners: Male  ?Other Topics Concern  ? Not on file  ?Social History Narrative  ? Lives alone.  2 living children (1 deceased), 1 in [place], 1 in [place], 6 grandchildren.  Works at [employer].   Exercise some.  As of 07/2016  ? ?Social Determinants of Health  ? ?  Financial Resource Strain: Not on file  ?Food Insecurity: Not on file  ?Transportation Needs: Not on file  ?Physical Activity: Not on file  ?Stress: Not on file  ?Social Connections: Not on file  ?  ? ?Family History: ?The patient's family history includes Arthritis in her sister and sister; Cancer in her father; Coronary artery disease in her mother; Diabetes in her son; Fibromyalgia in her sister; Heart disease in her mother; Kidney disease in her mother; Stroke in her mother. ? ?ROS:   ?Please see the history of present illness.    ? All other systems reviewed and are negative. ? ?EKGs/Labs/Other Studies Reviewed:   ? ?The following studies were reviewed today: ? ?LHC 08/06/21: ? Mid LM to Dist LM lesion is 30% stenosed. ?  Ost LAD lesion is 50% stenosed. ?  Lesion #1: Prox LAD to Mid LAD lesion is 65% stenosed with 35% stenosed side branch in 2nd Diag.-FFR 0.73. ?  A drug-eluting stent was successfully placed using a SYNERGY XD 2.75X20.  Postdilated tapered fashion from 3.1 to 2.9 mm.  (Proximal optimization) ?  Post intervention, there is a 0% residual stenosis in the parent vessel, and the side branch stenosis was stable at 35% residual stenosis. ?  -------------------------------------------------------------- ?  Lesion #2 and 3: Prox Cx-1 lesion is 35% stenosed with 60% stenosed side branch in LPAV (AVG).Prox Cx-2 lesion is 90% stenosed.  This was the culprit lesion noted on CT FFR ?  A drug-eluting stent was successfully  placed crossing the sidebranch, using a SYNERGY XD 3.0X12.  Postdilated to 3.1 mm. ?  Balloon angioplasty was performed through the stent in the ostium of the LPAV/AVG branch, using a BALLN [place] EMERGE MR 2.

## 2022-03-17 NOTE — Patient Instructions (Signed)
Medication Instructions:  ?Start Zetia 10 mg ( Take 1 Tablet Daily). ?*If you need a refill on your cardiac medications before your next appointment, please call your pharmacy* ? ? ?Lab Work: ?Lipid Panel, Direct LDL ?If you have labs (blood work) drawn today and your tests are completely normal, you will receive your results only by: ?MyChart Message (if you have MyChart) OR ?A paper copy in the mail ?If you have any lab test that is abnormal or we need to change your treatment, we will call you to review the results. ? ? ?Testing/Procedures: ?No Testing ? ? ?Follow-Up: ?At [facility], you and your health needs are our priority.  As part of our continuing mission to provide you with exceptional heart care, we have created designated Provider Care Teams.  These Care Teams include your primary Cardiologist (physician) and Advanced Practice Providers (APPs -  Physician Assistants and Nurse Practitioners) who all work together to provide you with the care you need, when you need it. ? ?We recommend signing up for the patient portal called "MyChart".  Sign up information is provided on this After Visit Summary.  MyChart is used to connect with patients for Virtual Visits (Telemedicine).  Patients are able to view lab/test results, encounter notes, upcoming appointments, etc.  Non-urgent messages can be sent to your provider as well.   ?To learn more about what you can do with MyChart, go to [email or web address].   ? ?Your next appointment:   ?As Scheduled ? ?The format for your next appointment:   ?In Person ? ?Provider:   ?Kirk Ruths, MD   ? ? ? ? ?Important Information About Sugar ? ? ? ? ?  ?

## 2022-03-18 LAB — LIPID PANEL
Chol/HDL Ratio: 2.2 ratio (ref 0.0–4.4)
Cholesterol, Total: 123 mg/dL (ref 100–199)
HDL: 55 mg/dL (ref >39)
LDL Chol Calc (NIH): 55 mg/dL (ref 0–99)
Triglycerides: 62 mg/dL (ref 0–149)
VLDL Cholesterol Cal: 13 mg/dL (ref 5–40)

## 2022-03-18 LAB — LDL CHOLESTEROL, DIRECT: LDL Direct: 62 mg/dL (ref 0–99)

## 2022-05-21 ENCOUNTER — Other Ambulatory Visit: Payer: Self-pay

## 2022-05-21 ENCOUNTER — Telehealth: Payer: Self-pay | Admitting: Cardiology

## 2022-05-21 MED ORDER — TICAGRELOR 90 MG PO TABS: 90.0000 mg | ORAL_TABLET | Freq: Two times a day (BID) | ORAL | 0 refills | Status: DC

## 2022-05-21 NOTE — Telephone Encounter (Signed)
Medication refilled and sent to desired pharmacy. Patient notified and expressed understanding

## 2022-05-21 NOTE — Telephone Encounter (Signed)
*  STAT* If patient is at the pharmacy, call can be transferred to refill team.   1. Which medications need to be refilled? (please list name of each medication and dose if known)   ticagrelor (BRILINTA) 90 MG TABS tablet    2. Which pharmacy/location (including street and city if local pharmacy) is medication to be sent to? [place], [place] - [address] AT [place]  3. Do they need a 30 day or 90 day supply?  30 day

## 2022-06-28 NOTE — Progress Notes (Signed)
HPI:FU CAD. ABIs 9/17 normal.  Seen for a syncopal episode April 2018.  Event monitor 5/18 showed sinus with PVC.  Echocardiogram September 2022 showed normal LV function, grade 1 diastolic dysfunction.  Cardiac CTA September 2022 suggested significant circumflex disease.  Cardiac catheterization September 2022 showed 30% distal left main, 50% ostial LAD, 65% proximal to mid LAD which was treated with drug-eluting stent, 90% circumflex stenosis which was also treated with drug-eluting stent and 60% LPAV treated with angioplasty.  Since last seen, she is doing well with no dyspnea.  She denies exertional chest pain.  No syncope.  Current Outpatient Medications  Medication Sig Dispense Refill   aspirin EC 81 MG tablet Take 81 mg by mouth daily. Swallow whole.     diclofenac sodium (VOLTAREN) 1 % GEL Apply 1 application topically 3 (three) times daily as needed (pain).     famotidine (PEPCID) 20 MG tablet Take 1 tablet (20 mg total) by mouth 2 (two) times daily. 30 tablet 0   nicotine (NICODERM CQ) 14 mg/24hr patch Place 1 patch (14 mg total) onto the skin daily. 28 patch 0   nicotine (NICODERM CQ) 7 mg/24hr patch Place 1 patch (7 mg total) onto the skin daily. 28 patch 0   OVER THE COUNTER MEDICATION Apply 1 application. topically daily as needed (pain). Cannabidiol     Polyvinyl Alcohol-Povidone (REFRESH OP) Place 1 drop into both eyes daily as needed (dry eyes).     pregabalin (LYRICA) 25 MG capsule Take 25 mg by mouth 3 (three) times daily as needed (fibromyalgia).     rosuvastatin (CRESTOR) 40 MG tablet Take 40 mg by mouth daily.     SYNTHROID 137 MCG tablet Take 1 tablet (137 mcg total) by mouth daily before breakfast. 90 tablet 1   ticagrelor (BRILINTA) 90 MG TABS tablet Take 1 tablet (90 mg total) by mouth 2 (two) times daily. 60 tablet 0   tiZANidine (ZANAFLEX) 4 MG tablet Take 4 mg by mouth 2 (two) times daily as needed for muscle spasms.  2   traMADol (ULTRAM) 50 MG tablet Take 50  mg by mouth 3 (three) times daily as needed for moderate pain.     ezetimibe (ZETIA) 10 MG tablet Take 1 tablet (10 mg total) by mouth daily. 90 tablet 3   No current facility-administered medications for this visit.     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 Jeffie Pollock 2014   Hyperlipidemia    managed by Dr. Ouida Sills   Hypothyroidism    Leg length inequality    sees rheumatology   Migraine    "q 3-4 months" (12/05/2013)   Osteoarthritis    Rheumatoid and OA?  Dr. Titus Mould ([facility], rheumatology)   Pneumonia    "abuntantly as a child"   Rheumatoid arthritis ([place])    Dr. Titus Mould   Shortness of breath    "at rest" (12/05/2013)   Sinus bradycardia    Thyroid cancer ([place])    S/P radiation &  OR    Past Surgical History:  Procedure Laterality Date   COLONOSCOPY  10/13/2010   external and internal hemorrhoids, repeat 2021; Dr. Clarene Essex   CORONARY BALLOON ANGIOPLASTY N/A 08/06/2021   Procedure: CORONARY BALLOON ANGIOPLASTY;  Surgeon: Leonie Man, MD;  Location: [facility];  Service: Cardiovascular;  Laterality: N/A;   CORONARY STENT INTERVENTION N/A 08/06/2021   Procedure: CORONARY STENT INTERVENTION;  Surgeon: Leonie Man, MD;  Location: [facility];  Service: Cardiovascular;  Laterality: N/A;   CYSTOSCOPY  02/26/2013   Dr. Jeffie Pollock (normal; follicular cystitis)   EUS N/A 03/18/2016   Procedure: UPPER ENDOSCOPIC ULTRASOUND (EUS) RADIAL;  Surgeon: Milus Banister, MD;  Location: [facility];  Service: Endoscopy;  Laterality: N/A;    INTRAVASCULAR PRESSURE WIRE/FFR STUDY N/A 08/06/2021   Procedure: INTRAVASCULAR PRESSURE WIRE/FFR STUDY;  Surgeon: Leonie Man, MD;  Location: [facility];  Service: Cardiovascular;  Laterality: N/A;   LEFT HEART CATH AND CORONARY ANGIOGRAPHY N/A 08/06/2021   Procedure: LEFT HEART CATH AND CORONARY ANGIOGRAPHY;  Surgeon: Leonie Man, MD;  Location: [facility];  Service: Cardiovascular;  Laterality: N/A;   THYROIDECTOMY, PARTIAL  11/2005; 11/2006   TOTAL ABDOMINAL HYSTERECTOMY  ~ 1990   due to bleeding   TUBAL LIGATION  1980's    Social History   Socioeconomic History   Marital status: Divorced    Spouse name: Not on file   Number of children: 3   Years of education: college   Highest education level: Not on file  Occupational History   Occupation: Armed forces operational officer: [employer]  Tobacco Use   Smoking status: Every Day    Packs/day: 0.50    Years: 35.00    Total pack years: 17.50    Types: Cigarettes   Smokeless tobacco: Never  Substance and Sexual Activity   Alcohol use: Yes    Alcohol/week: 0.0 standard drinks of alcohol    Comment: once to twice a week    Drug use: No   Sexual activity: Not Currently    Partners: Male  Other Topics Concern   Not on file  Social History Narrative   Lives alone.  2 living children (1 deceased), 1 in [place], 1 in [place], 6 grandchildren.  Works at [employer].   Exercise some.  As of 07/2016   Social Determinants of Health   Financial Resource Strain: Not on file  Food Insecurity: Not on file  Transportation Needs: Not on file  Physical Activity: Not on file  Stress: Not on file  Social Connections: Not on file  Intimate Partner Violence: Not on file    Family History  Problem Relation Age of Onset   Stroke Mother    Coronary artery disease Mother    Kidney disease Mother        on dialysis   Heart disease Mother    Cancer Father        multiple myeloma   Arthritis Sister        rheumatoid and  osteoarthritis   Fibromyalgia Sister    Arthritis Sister        RA and OA   Diabetes Son     ROS: no fevers or chills, productive cough, hemoptysis, dysphasia, odynophagia, melena, hematochezia, dysuria, hematuria, rash, seizure activity, orthopnea, PND, pedal edema, claudication. Remaining systems are negative.  Physical Exam: Well-developed well-nourished in no acute distress.  Skin is warm and dry.  HEENT is  normal.  Neck is supple.  Chest is clear to auscultation with normal expansion.  Cardiovascular exam is regular rate and rhythm.  Abdominal exam nontender or distended. No masses palpated. Extremities show no edema. neuro grossly intact  ECG- personally reviewed  A/P  1 chest pain-patient has had occasional chest pain previously but is pain-free at present.  2 coronary artery disease with history of PCI of the LAD and circumflex-continue aspirin and Brilinta.  Discontinue Brilinta at the end of September which will be 1 year from her previous PCI.  Continue statin.  3 hyperlipidemia-continue Crestor and Zetia.  4 tobacco abuse-patient counseled on discontinuing.  Kirk Ruths, MD

## 2022-06-29 ENCOUNTER — Encounter (HOSPITAL_BASED_OUTPATIENT_CLINIC_OR_DEPARTMENT_OTHER): Payer: Self-pay | Admitting: Emergency Medicine

## 2022-06-29 ENCOUNTER — Emergency Department (HOSPITAL_BASED_OUTPATIENT_CLINIC_OR_DEPARTMENT_OTHER)
Admission: EM | Admit: 2022-06-29 | Discharge: 2022-06-29 | Disposition: A | Discharge status: Home / Self Care | Payer: 59 | Attending: Emergency Medicine | Admitting: Emergency Medicine

## 2022-06-29 ENCOUNTER — Emergency Department (HOSPITAL_BASED_OUTPATIENT_CLINIC_OR_DEPARTMENT_OTHER): Payer: 59 | Admitting: Radiology

## 2022-06-29 DIAGNOSIS — R001 Bradycardia, unspecified: Secondary | ICD-10-CM | POA: Insufficient documentation

## 2022-06-29 DIAGNOSIS — E039 Hypothyroidism, unspecified: Secondary | ICD-10-CM | POA: Insufficient documentation

## 2022-06-29 DIAGNOSIS — I2510 Atherosclerotic heart disease of native coronary artery without angina pectoris: Secondary | ICD-10-CM | POA: Diagnosis not present

## 2022-06-29 DIAGNOSIS — R002 Palpitations: Secondary | ICD-10-CM | POA: Insufficient documentation

## 2022-06-29 DIAGNOSIS — J449 Chronic obstructive pulmonary disease, unspecified: Secondary | ICD-10-CM | POA: Diagnosis not present

## 2022-06-29 DIAGNOSIS — Z7982 Long term (current) use of aspirin: Secondary | ICD-10-CM | POA: Diagnosis not present

## 2022-06-29 LAB — BASIC METABOLIC PANEL
Anion gap: 9 (ref 5–15)
BUN: 11 mg/dL (ref 8–23)
CO2: 23 mmol/L (ref 22–32)
Calcium: 9.3 mg/dL (ref 8.9–10.3)
Chloride: 106 mmol/L (ref 98–111)
Creatinine, Ser: 0.82 mg/dL (ref 0.44–1.00)
GFR, Estimated: >60 mL/min (ref >60)
Glucose, Bld: 92 mg/dL (ref 70–99)
Potassium: 3.6 mmol/L (ref 3.5–5.1)
Sodium: 138 mmol/L (ref 135–145)

## 2022-06-29 LAB — CBC
HCT: 36.8 % (ref 36.0–46.0)
Hemoglobin: 12.7 g/dL (ref 12.0–15.0)
MCH: 26.8 pg (ref 26.0–34.0)
MCHC: 34.5 g/dL (ref 30.0–36.0)
MCV: 77.8 fL — ABNORMAL LOW (ref 80.0–100.0)
Platelets: 288 10*3/uL (ref 150–400)
RBC: 4.73 MIL/uL (ref 3.87–5.11)
RDW: 15.1 % (ref 11.5–15.5)
WBC: 3.2 10*3/uL — ABNORMAL LOW (ref 4.0–10.5)
nRBC: 0 % (ref 0.0–0.2)

## 2022-06-29 LAB — TROPONIN I (HIGH SENSITIVITY)
Troponin I (High Sensitivity): 2 ng/L (ref <18)
Troponin I (High Sensitivity): 2 ng/L (ref <18)

## 2022-06-29 NOTE — ED Notes (Signed)
Pt verbalizes understanding of discharge instructions. Opportunity for questioning and answers were provided. Pt discharged from ED to home.   ? ?

## 2022-06-29 NOTE — ED Provider Notes (Signed)
[facility] EMERGENCY DEPT Provider Note   CSN: [id number] Arrival date & time: 06/29/22  1758     History  Chief Complaint  Patient presents with   Palpitations    Emma Wilson is a 66 y.o. female.  Patient is a 66 year old female with a past medical history of CAD status post stent placement, hypothyroidism post thyroidectomy, and COPD presenting to the emergency department with palpitations.  Patient states that she was at work this afternoon sitting at her desk when she started to feel like her heart was racing.  She states that this lasted for about an hour.  She denied any associated chest pain, shortness of breath, lightheadedness or dizziness, nausea, vomiting or diaphoresis.  She states that she has had palpitations before in the past that she has seen cardiologist for but is unsure the cause.  She states that she did lose her thyroid medication as she was moving and did not take it for several days but has been taking it as prescribed since Saturday.  She denies any fevers, chills or cough.  The history is provided by the patient.  Palpitations      Home Medications Prior to Admission medications   Medication Sig Start Date End Date Taking? Authorizing Provider  aspirin EC 81 MG tablet Take 81 mg by mouth daily. Swallow whole.    [provider]  diclofenac sodium (VOLTAREN) 1 % GEL Apply 1 application topically 3 (three) times daily as needed (pain).    [provider]  ezetimibe (ZETIA) 10 MG tablet Take 1 tablet (10 mg total) by mouth daily. 03/17/22 06/15/22  Ledora Bottcher, PA  famotidine (PEPCID) 20 MG tablet Take 1 tablet (20 mg total) by mouth 2 (two) times daily. 03/13/22   Godfrey Pick, MD  nicotine (NICODERM CQ) 14 mg/24hr patch Place 1 patch (14 mg total) onto the skin daily. 01/11/22   Lelon Perla, MD  nicotine (NICODERM CQ) 7 mg/24hr patch Place 1 patch (7 mg total) onto the skin daily. 01/11/22   Lelon Perla, MD   OVER THE COUNTER MEDICATION Apply 1 application. topically daily as needed (pain). Cannabidiol    [provider]  Polyvinyl Alcohol-Povidone (REFRESH OP) Place 1 drop into both eyes daily as needed (dry eyes).    [provider]  pregabalin (LYRICA) 25 MG capsule Take 25 mg by mouth 3 (three) times daily as needed (fibromyalgia).    [provider]  rosuvastatin (CRESTOR) 40 MG tablet Take 40 mg by mouth daily.    [provider]  SYNTHROID 137 MCG tablet Take 1 tablet (137 mcg total) by mouth daily before breakfast. 07/27/16   Tysinger, Camelia Eng, PA-C  ticagrelor (BRILINTA) 90 MG TABS tablet Take 1 tablet (90 mg total) by mouth 2 (two) times daily. 05/21/22   Lelon Perla, MD  tiZANidine (ZANAFLEX) 4 MG tablet Take 4 mg by mouth 2 (two) times daily as needed for muscle spasms. 08/17/18   [provider]  traMADol (ULTRAM) 50 MG tablet Take 50 mg by mouth 3 (three) times daily as needed for moderate pain. 07/08/21   [provider]      Allergies    Patient has no known allergies.    Review of Systems   Review of Systems  Cardiovascular:  Positive for palpitations.    Physical Exam Updated Vital Signs BP 130/79   Pulse (!) 50   Temp 97.9 F (36.6 C)   Resp 16  Ht '5\' 7"'$  (1.702 m)   Wt 56.2 kg   SpO2 98%   BMI 19.42 kg/m  Physical Exam Vitals and nursing note reviewed.  Constitutional:      General: She is not in acute distress.    Appearance: Normal appearance.  HENT:     Head: Normocephalic and atraumatic.     Mouth/Throat:     Mouth: Mucous membranes are moist.  Eyes:     Extraocular Movements: Extraocular movements intact.     Conjunctiva/sclera: Conjunctivae normal.  Cardiovascular:     Rate and Rhythm: Regular rhythm. Bradycardia present.     Pulses: Normal pulses.  Pulmonary:     Effort: Pulmonary effort is normal.     Breath sounds: Normal breath sounds.  Abdominal:     General: Abdomen is flat.      Palpations: Abdomen is soft.     Tenderness: There is no abdominal tenderness.  Musculoskeletal:        General: Normal range of motion.     Cervical back: Normal range of motion and neck supple.     Right lower leg: No edema.     Left lower leg: No edema.  Skin:    General: Skin is warm and dry.  Neurological:     General: No focal deficit present.     Mental Status: She is alert and oriented to person, place, and time.     ED Results / Procedures / Treatments   Labs (all labs ordered are listed, but only abnormal results are displayed) Labs Reviewed  CBC - Abnormal; Notable for the following components:      Result Value   WBC 3.2 (*)    MCV 77.8 (*)    All other components within normal limits  BASIC METABOLIC PANEL  TROPONIN I (HIGH SENSITIVITY)  TROPONIN I (HIGH SENSITIVITY)    EKG EKG Interpretation  Date/Time:  Tuesday June 29 2022 18:07:49 EDT Ventricular Rate:  52 PR Interval:  136 QRS Duration: 78 QT Interval:  438 QTC Calculation: 407 R Axis:   69 Text Interpretation: Sinus bradycardia Minimal voltage criteria for LVH, may be normal variant ( Sokolow-Lyon ) Borderline ECG Bradycardia, otherwise no significant change from prior EKG Confirmed by Oneal Deputy [phone]) on 06/29/2022 8:38:29 PM  Radiology DG Chest 2 View  Result Date: 06/29/2022 CLINICAL DATA:  Palpitations, EXAM: CHEST - 2 VIEW COMPARISON:  03/13/2022 FINDINGS: Parenchymal changes within the lung apices bilaterally relate to moderate underlying emphysema better appreciated on CT examination 08/04/2021. No superimposed focal pulmonary infiltrate. No pneumothorax or pleural effusion. Cardiac size within normal limits. Pulmonary vascularity is normal. No acute bone abnormality. IMPRESSION: 1. No radiographic evidence of acute cardiopulmonary disease. 2. Emphysema. Electronically Signed   By: Fidela Salisbury M.D.   On: 06/29/2022 18:44    Procedures Procedures    Medications Ordered in  ED Medications - No data to display  ED Course/ Medical Decision Making/ A&P                           Medical Decision Making This patient presents to the ED with chief complaint(s) of palpitations with pertinent past medical history of CAD, hypothyroidism which further complicates the presenting complaint. The complaint involves an extensive differential diagnosis and also carries with it a high risk of complications and morbidity.    The differential diagnosis includes ACS, arrhythmia, electrolyte abnormality, thyroid dysfunction  Additional history obtained: Additional history obtained from N/A  Records reviewed previous admission documents -heart rate previously in the 50s to 60s  ED Course and Reassessment: Upon my evaluation, the patient states that her palpitations have resolved and she feels back to her baseline.  Patient's EKG shows sinus bradycardia without acute ischemic changes and her work-up is within normal range.  The patient was offered thyroid function testing in the setting of her bradycardia but using shared decision-making with the patient, due to her history of bradycardia and recent medication noncompliance.  She states that she would prefer to follow-up with her outpatient doctor for thyroid testing.  She has no signs of myxedema coma or thyrotoxicosis on her exam.  Patient is stable for discharge home.  She was recommended to follow-up with her cardiologist and she is agreeable with the plan.  Independent labs interpretation:  The following labs were independently interpreted: Labs within normal range  Independent visualization of imaging: - I independently visualized the following imaging with scope of interpretation limited to determining acute life threatening conditions related to emergency care: Chest x-ray, which revealed no acute disease  Consultation: - Consulted or discussed management/test interpretation w/ external professional: N/A  Consideration for  admission or further workup: Patient has no arrhythmias or lab abnormalities that require admission.  She will follow-up with her cardiologist and primary doctor for further evaluation of her palpitations Social Determinants of health: N/A    Amount and/or Complexity of Data Reviewed Labs: ordered. Radiology: ordered.           Final Clinical Impression(s) / ED Diagnoses Final diagnoses:  None    Rx / DC Orders ED Discharge Orders     None         Ottie Glazier, DO 06/29/22 2134

## 2022-06-29 NOTE — Discharge Instructions (Addendum)
You were seen in the emergency department for your palpitations.  Your heart rate was slow but had no signs of abnormal heart rhythm or signs of heart attack.  Your electrolytes were all normal and your chest x-ray was normal.  It is unclear what the cause of your palpitations were today you should follow-up with your cardiologist to have your symptoms rechecked.  You can also follow-up with your primary doctor to have your thyroid function test.  It is unlikely that your thyroid is causing your palpitations today.  You should return to the emergency department if you develop chest pain, you pass out, you have shortness of breath, your palpitations return and do not go away on their own or you have any other new or concerning symptoms.

## 2022-06-29 NOTE — ED Triage Notes (Signed)
Pt arrived POV, ambulatory. Pt reports at approx 1430 this afternoon she felt "like her heart was racing" which lasted approx 1 hr. Pt reports that since this episode she has had "a weird feeling" down her left arm. At present pt denies CP, SOB, N/V, dizziness, weakness and is no obvious distress.

## 2022-07-09 ENCOUNTER — Encounter: Payer: Self-pay | Admitting: Cardiology

## 2022-07-09 ENCOUNTER — Ambulatory Visit (INDEPENDENT_AMBULATORY_CARE_PROVIDER_SITE_OTHER): Payer: 59 | Admitting: Cardiology

## 2022-07-09 VITALS — BP 130/76 | HR 63 | Ht 67.0 in | Wt 125.4 lb

## 2022-07-09 DIAGNOSIS — I25110 Atherosclerotic heart disease of native coronary artery with unstable angina pectoris: Secondary | ICD-10-CM | POA: Diagnosis not present

## 2022-07-09 DIAGNOSIS — F17200 Nicotine dependence, unspecified, uncomplicated: Secondary | ICD-10-CM | POA: Diagnosis not present

## 2022-07-09 DIAGNOSIS — E785 Hyperlipidemia, unspecified: Secondary | ICD-10-CM

## 2022-07-09 NOTE — Patient Instructions (Signed)
Medication Instructions:  STOP BRILINTA IN [place]  *If you need a refill on your cardiac medications before your next appointment, please call your pharmacy*  Lab Work:   Testing/Procedures:  NONE    NONE  Follow-Up: Your next appointment:  12 month(s) In Person with Kirk Ruths, MD    :  Please call our office 2 months in advance to schedule this appointment   At [facility], you and your health needs are our priority.  As part of our continuing mission to provide you with exceptional heart care, we have created designated Provider Care Teams.  These Care Teams include your primary Cardiologist (physician) and Advanced Practice Providers (APPs -  Physician Assistants and Nurse Practitioners) who all work together to provide you with the care you need, when you need it. rt, go to [email or web address].    Important Information About Sugar

## 2022-07-15 IMAGING — MR MR LUMBAR SPINE W/O CM
4 of 5 series · 27 of 48 positions shown · non-contrast
Comparison: 12/20/2019

CLINICAL DATA: Low back pain, radiating to right leg

EXAM:
MRI LUMBAR SPINE WITHOUT CONTRAST
TECHNIQUE: Multiplanar, multisequence MR imaging of the lumbar spine was
performed. No intravenous contrast was administered.

[Series 3: T2 · sagittal · 4.0mm · 1.09mm/px · 6 of 17 slices shown (1 of 2)]
[im 1/17]
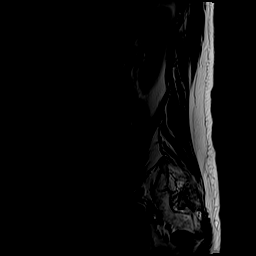
[im 4/17]
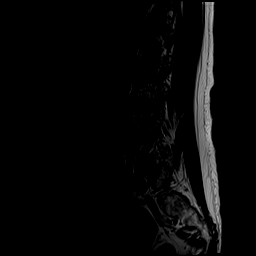
[im 7/17]
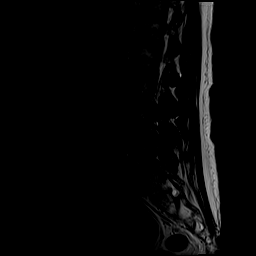
[im 10/17]
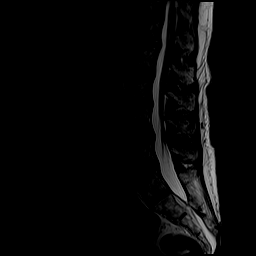
[im 13/17]
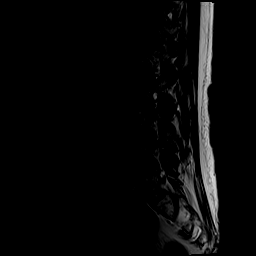
[im 17/17]
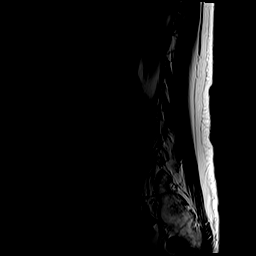

[Series 5: T1 · sagittal · 4.0mm · 1.09mm/px · 6 of 17 slices shown (1 of 2)]
[im 1/17]
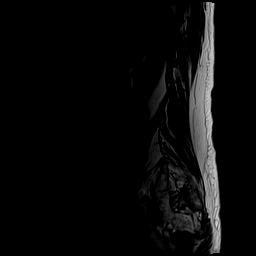
[im 4/17]
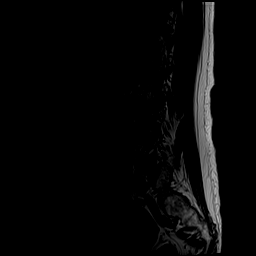
[im 7/17]
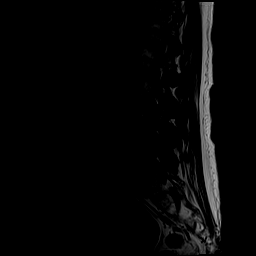
[im 10/17]
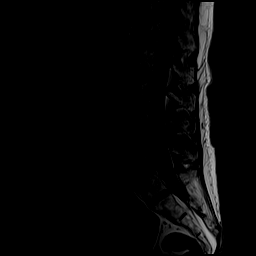
[im 13/17]
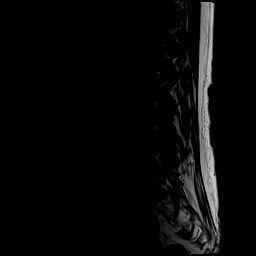
[im 17/17]
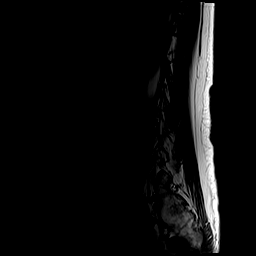

[Series 6: T2 · axial · 4.0mm · 0.39mm/px · z∈[+5,+240]mm · 9 of 42 slices shown (2 of 2)]
[im 1/42]
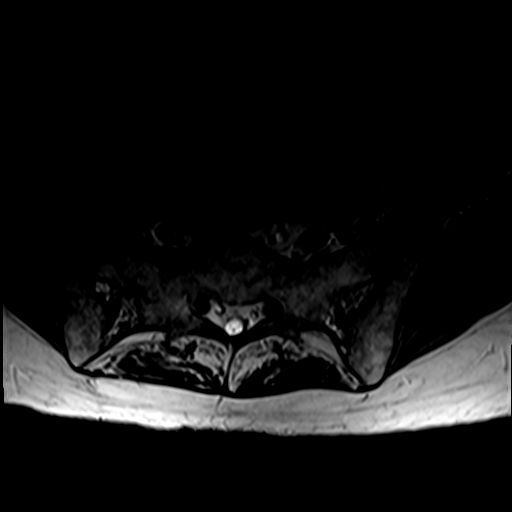
[im 6/42]
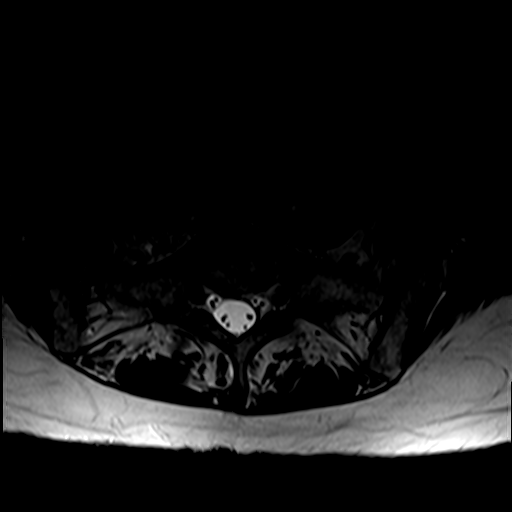
[im 12/42]
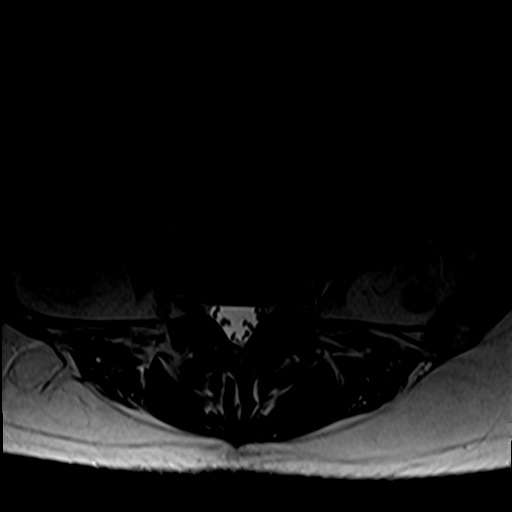
[im 18/42]
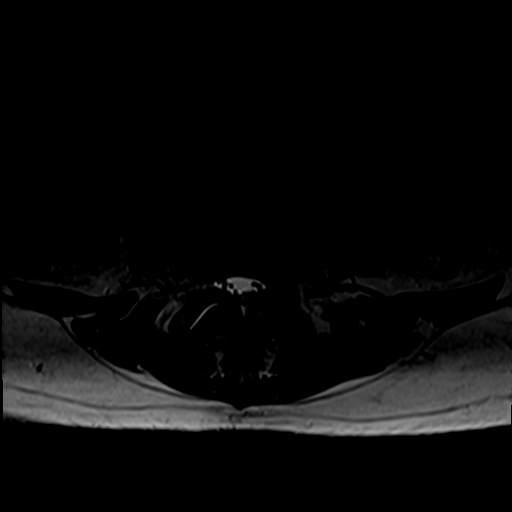
[im 21/42]
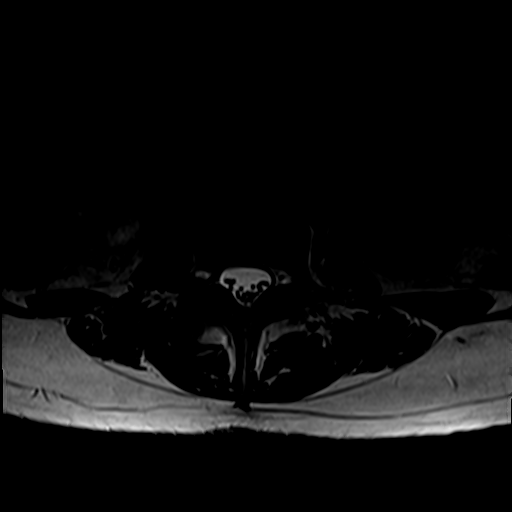
[im 24/42]
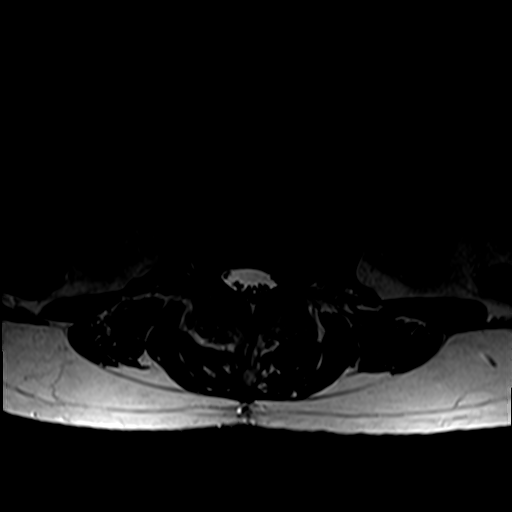
[im 30/42]
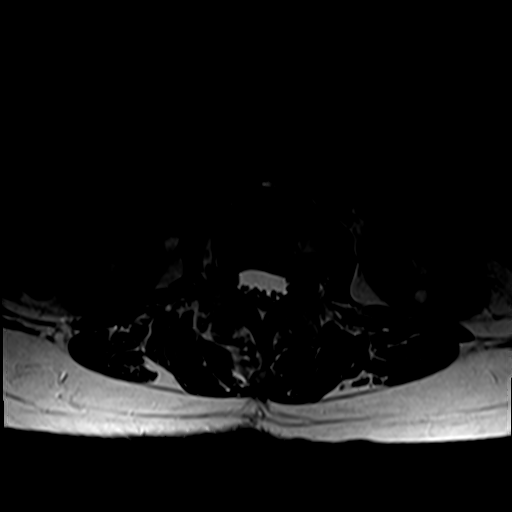
[im 36/42]
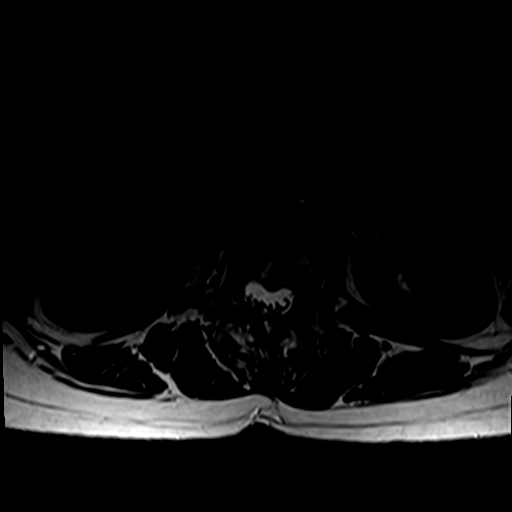
[im 42/42]
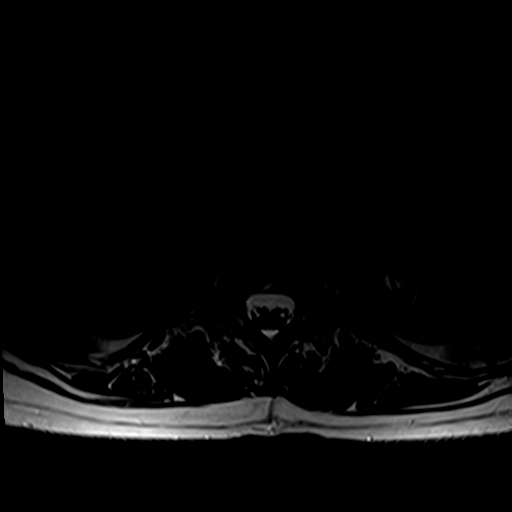

[Series 7: T1 · axial · 4.0mm · 0.39mm/px · z∈[+5,+211]mm · 6 of 42 slices shown (2 of 2)]
[im 1/42]
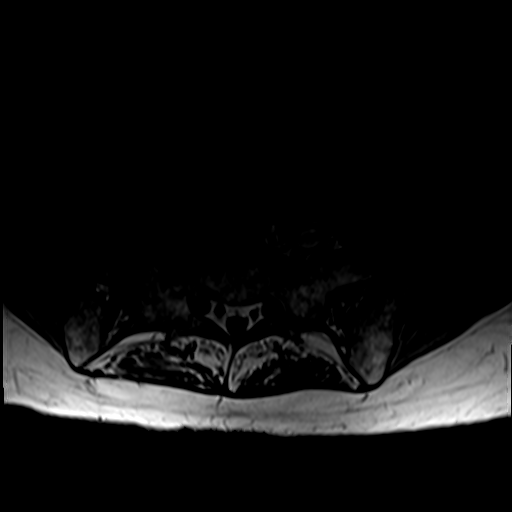
[im 6/42]
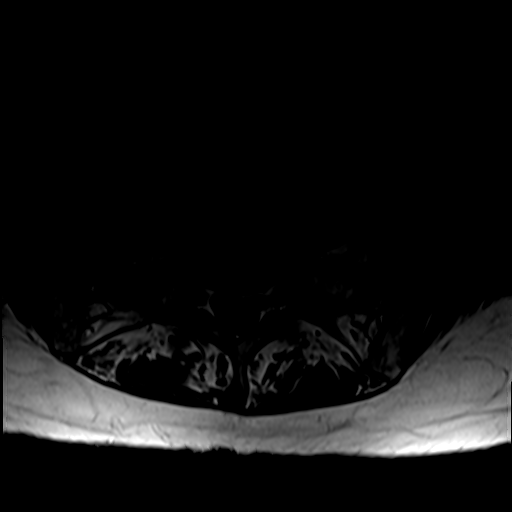
[im 12/42]
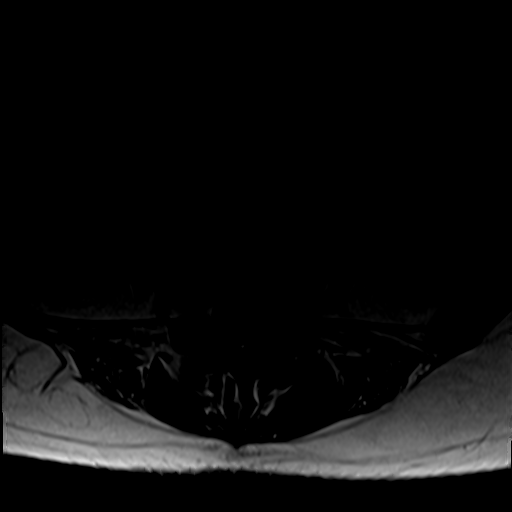
[im 18/42]
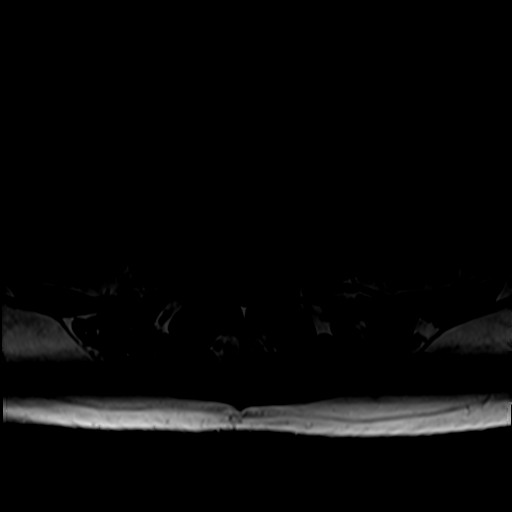
[im 21/42]
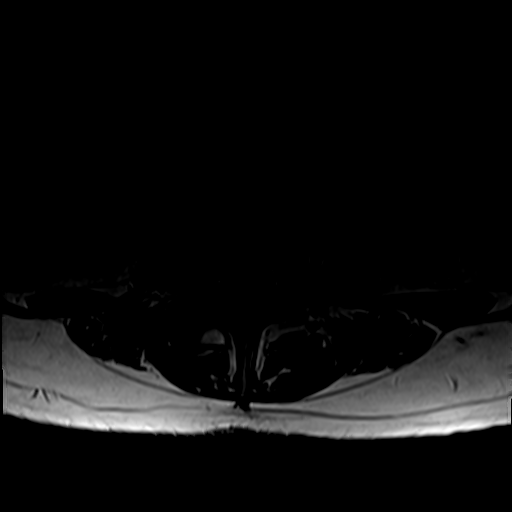
[im 36/42]
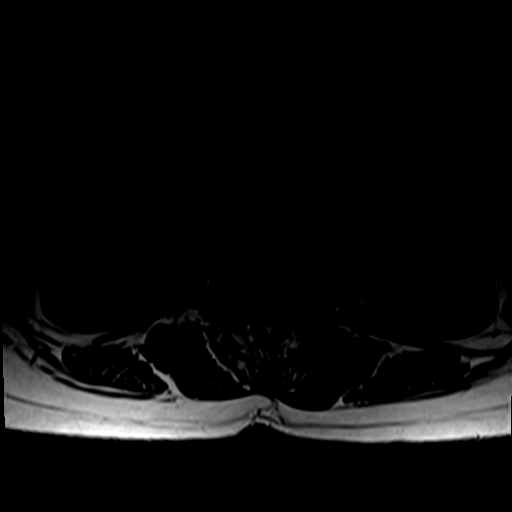

[27 of 48 positions shown; findings below may reference images not displayed]

FINDINGS: Segmentation: Transitional lumbosacral anatomy, with sacralization
of L5.

Alignment:  Physiologic.

Vertebrae:  No fracture, evidence of discitis, or bone lesion.

Conus medullaris and cauda equina: Conus extends to the L1 level.
Conus and cauda equina appear normal.

Paraspinal and other soft tissues: Small left renal cysts.
Infrarenal abdominal aortic ectasia, measuring up to 2.3 cm (series
6, image 22), with the more proximal infrarenal aorta measuring up
to 1.6 cm.

Disc levels:

T12-L1: Redemonstrated large central disc extrusion with caudal
migration, unchanged from the prior exam. Moderate facet
arthropathy. No spinal canal stenosis. Mild bilateral neural
foraminal narrowing, which has progressed from the prior exam.

L1-L2: Mild disc bulge. Moderate facet arthropathy with ligamentum
flavum hypertrophy. No spinal canal stenosis. Mild bilateral neural
foraminal narrowing, which has progressed from prior exam.

L2-L3: Broad-based disc bulge. Moderate facet arthropathy.
Ligamentum flavum hypertrophy. Mild narrowing of the left lateral
recess. No spinal canal stenosis. Mild left greater than right
neural foraminal narrowing, which has progressed from the prior
exam.

L3-L4: Broad-based disc bulge. Severe left and moderate right facet
arthropathy, with ligamentum flavum hypertrophy. Narrowing of the
bilateral lateral recesses. No spinal canal stenosis. Mild left
neural foraminal narrowing.

L4-L5: No significant disc bulge. Moderate facet arthropathy. No
spinal canal stenosis. No neural foraminal narrowing.

L5-S1: No significant disc bulge. No spinal canal stenosis or neural
foraminal narrowing.
IMPRESSION: 1. Multilevel facet arthropathy, which contributes to mild neural
foraminal narrowing bilaterally at T12-L1, L1-L2, and L2-L3, as well
as on the left at L3-L4, progressed from the prior exam.
2. Redemonstrated central disc extrusion with caudal migration at
T12-L1, without spinal canal stenosis.

## 2022-09-18 ENCOUNTER — Ambulatory Visit
Admission: EM | Admit: 2022-09-18 | Discharge: 2022-09-18 | Disposition: A | Discharge status: Home / Self Care | Payer: 59 | Attending: Family Medicine | Admitting: Family Medicine

## 2022-09-18 DIAGNOSIS — J069 Acute upper respiratory infection, unspecified: Secondary | ICD-10-CM | POA: Insufficient documentation

## 2022-09-18 DIAGNOSIS — R509 Fever, unspecified: Secondary | ICD-10-CM | POA: Diagnosis not present

## 2022-09-18 DIAGNOSIS — Z1152 Encounter for screening for COVID-19: Secondary | ICD-10-CM | POA: Insufficient documentation

## 2022-09-18 DIAGNOSIS — J441 Chronic obstructive pulmonary disease with (acute) exacerbation: Secondary | ICD-10-CM | POA: Insufficient documentation

## 2022-09-18 DIAGNOSIS — R52 Pain, unspecified: Secondary | ICD-10-CM | POA: Diagnosis present

## 2022-09-18 MED ORDER — ALBUTEROL SULFATE HFA 108 (90 BASE) MCG/ACT IN AERS: 2.0000 | INHALATION_SPRAY | RESPIRATORY_TRACT | 0 refills | Status: DC | PRN

## 2022-09-18 MED ORDER — PROMETHAZINE-DM 6.25-15 MG/5ML PO SYRP: 5.0000 mL | ORAL_SOLUTION | Freq: Four times a day (QID) | ORAL | 0 refills | Status: DC | PRN

## 2022-09-18 NOTE — ED Triage Notes (Signed)
Pt reports she has chills, fatigue, slight fever just doesn't feel good. Symptoms started yesterday. Pt drunk tea and used vic vapor rub which provided some relief.   Called telemed doc and they said she need to come in to get seen for covid symptoms.

## 2022-09-19 LAB — RESP PANEL BY RT-PCR (FLU A&B, COVID) ARPGX2
Influenza A by PCR: POSITIVE — AB
Influenza B by PCR: NEGATIVE
SARS Coronavirus 2 by RT PCR: NEGATIVE

## 2022-09-19 NOTE — ED Provider Notes (Signed)
[facility]    CSN: [id number] Arrival date & time: 09/18/22  1427      History   Chief Complaint No chief complaint on file.   HPI Emma Wilson is a 65 y.o. female.   Patient presenting today with 1 day history of fever, chills, body aches, fatigue, congestion, cough.  Denies chest pain, shortness of breath, abdominal pain, nausea vomiting or diarrhea.  States she had a virtual visit today and was told to come in person to be evaluated for COVID-19.  History of COPD not currently on any inhaler therapies.  Trying tea and Vicks vapor rub with mild temporary relief of symptoms.    Past Medical History:  Diagnosis Date   Abnormality of gait 03/19/2014   one leg shorter than other"uses cane frequently"   Carpal tunnel syndrome    right wrist   Chronic back pain    DDD   COPD (chronic obstructive pulmonary disease) ([place])    Depression 2006   "after my son passed" (12/05/2013)   Fibromyalgia    GERD (gastroesophageal reflux disease)    Hematuria    microscopic hematuria (urol & nephrol w/u neg), multiple UTI`s, glomerular from [facility], Dr. Jeffie Pollock 2014   Hyperlipidemia    managed by Dr. Ouida Sills   Hypothyroidism    Leg length inequality    sees rheumatology   Migraine    "q 3-4 months" (12/05/2013)   Osteoarthritis    Rheumatoid and OA?  Dr. Titus Mould ([facility], rheumatology)   Pneumonia    "abuntantly as a child"   Rheumatoid arthritis ([place])    Dr. Titus Mould   Shortness of breath    "at rest" (12/05/2013)   Sinus bradycardia    Thyroid cancer ([place])    S/P radiation & OR    Patient Active Problem List   Diagnosis Date Noted   Unstable angina ([place]) 08/06/2021   Abnormal cardiac CT angiography    Coronary artery disease involving native coronary artery of native heart with unstable angina pectoris ([place])    Chest pain 08/04/2021   Asymptomatic microscopic hematuria 06/26/2017   History of kidney stones 05/24/2017   History of thyroid cancer 05/24/2017    Encounter for health maintenance examination in adult 07/26/2016   Need for prophylactic vaccination and inoculation against influenza 07/26/2016   History of COPD 07/26/2016   Hereditary and idiopathic peripheral neuropathy 07/26/2016   Decreased radial pulse 07/26/2016   Decreased pulses in feet 07/26/2016   Abnormality of gait 03/19/2014   Pure hypercholesterolemia 01/24/2013   Hematuria, unspecified 01/24/2013   Fibromyalgia 01/24/2013   Bradycardia 03/25/2011   TOBACCO ABUSE 02/12/2010   Hypothyroidism 02/11/2010   COPD (chronic obstructive pulmonary disease) ([place]) 02/11/2010   GERD 02/11/2010    Past Surgical History:  Procedure Laterality Date   COLONOSCOPY  10/13/2010   external and internal hemorrhoids, repeat 2021; Dr. Clarene Essex   CORONARY BALLOON ANGIOPLASTY N/A 08/06/2021   Procedure: CORONARY BALLOON ANGIOPLASTY;  Surgeon: Leonie Man, MD;  Location: [facility];  Service: Cardiovascular;  Laterality: N/A;   CORONARY STENT INTERVENTION N/A 08/06/2021   Procedure: CORONARY STENT INTERVENTION;  Surgeon: Leonie Man, MD;  Location: [facility];  Service: Cardiovascular;  Laterality: N/A;   CYSTOSCOPY  02/26/2013   Dr. Jeffie Pollock (normal; follicular cystitis)   EUS N/A 03/18/2016   Procedure: UPPER ENDOSCOPIC ULTRASOUND (EUS) RADIAL;  Surgeon: Milus Banister, MD;  Location: [facility];  Service: Endoscopy;  Laterality: N/A;  INTRAVASCULAR PRESSURE WIRE/FFR STUDY N/A 08/06/2021   Procedure: INTRAVASCULAR PRESSURE WIRE/FFR STUDY;  Surgeon: Leonie Man, MD;  Location: [facility];  Service: Cardiovascular;  Laterality: N/A;   LEFT HEART CATH AND CORONARY ANGIOGRAPHY N/A 08/06/2021   Procedure: LEFT HEART CATH AND CORONARY ANGIOGRAPHY;  Surgeon: Leonie Man, MD;  Location: [facility];  Service: Cardiovascular;  Laterality: N/A;   THYROIDECTOMY, PARTIAL  11/2005; 11/2006   TOTAL ABDOMINAL HYSTERECTOMY  ~ 1990   due to bleeding   TUBAL  LIGATION  1980's    OB History   No obstetric history on file.      Home Medications    Prior to Admission medications   Medication Sig Start Date End Date Taking? Authorizing Provider  albuterol (VENTOLIN HFA) 108 (90 Base) MCG/ACT inhaler Inhale 2 puffs into the lungs every 4 (four) hours as needed for wheezing or shortness of breath. 09/18/22  Yes Volney American, PA-C  promethazine-dextromethorphan (PROMETHAZINE-DM) 6.25-15 MG/5ML syrup Take 5 mLs by mouth 4 (four) times daily as needed. 09/18/22  Yes Volney American, PA-C  aspirin EC 81 MG tablet Take 81 mg by mouth daily. Swallow whole.    [provider]  diclofenac sodium (VOLTAREN) 1 % GEL Apply 1 application topically 3 (three) times daily as needed (pain).    [provider]  ezetimibe (ZETIA) 10 MG tablet Take 1 tablet (10 mg total) by mouth daily. 03/17/22 06/15/22  Ledora Bottcher, PA  famotidine (PEPCID) 20 MG tablet Take 1 tablet (20 mg total) by mouth 2 (two) times daily. 03/13/22   Godfrey Pick, MD  nicotine (NICODERM CQ) 14 mg/24hr patch Place 1 patch (14 mg total) onto the skin daily. 01/11/22   Lelon Perla, MD  nicotine (NICODERM CQ) 7 mg/24hr patch Place 1 patch (7 mg total) onto the skin daily. 01/11/22   Lelon Perla, MD  OVER THE COUNTER MEDICATION Apply 1 application. topically daily as needed (pain). Cannabidiol    [provider]  Polyvinyl Alcohol-Povidone (REFRESH OP) Place 1 drop into both eyes daily as needed (dry eyes).    [provider]  pregabalin (LYRICA) 25 MG capsule Take 25 mg by mouth 3 (three) times daily as needed (fibromyalgia).    [provider]  rosuvastatin (CRESTOR) 40 MG tablet Take 40 mg by mouth daily.    [provider]  SYNTHROID 137 MCG tablet Take 1 tablet (137 mcg total) by mouth daily before breakfast. 07/27/16   Tysinger, Camelia Eng, PA-C  tiZANidine (ZANAFLEX) 4 MG tablet Take 4 mg by mouth 2 (two) times daily as  needed for muscle spasms. 08/17/18   [provider]  traMADol (ULTRAM) 50 MG tablet Take 50 mg by mouth 3 (three) times daily as needed for moderate pain. 07/08/21   [provider]    Family History Family History  Problem Relation Age of Onset   Stroke Mother    Coronary artery disease Mother    Kidney disease Mother        on dialysis   Heart disease Mother    Cancer Father        multiple myeloma   Arthritis Sister        rheumatoid and osteoarthritis   Fibromyalgia Sister    Arthritis Sister        RA and OA   Diabetes Son     Social History Social History   Tobacco Use   Smoking status: Every  Day    Packs/day: 0.50    Years: 35.00    Total pack years: 17.50    Types: Cigarettes   Smokeless tobacco: Never  Substance Use Topics   Alcohol use: Yes    Alcohol/week: 0.0 standard drinks of alcohol    Comment: once to twice a week    Drug use: No     Allergies   Patient has no known allergies.   Review of Systems Review of Systems HPI  Physical Exam Triage Vital Signs ED Triage Vitals  Enc Vitals Group     BP 09/18/22 1528 136/81     Pulse Rate 09/18/22 1528 73     Resp 09/18/22 1528 20     Temp 09/18/22 1528 99.9 F (37.7 C)     Temp Source 09/18/22 1528 Oral     SpO2 09/18/22 1528 94 %     Weight --      Height --      Head Circumference --      Peak Flow --      Pain Score 09/18/22 1534 0     Pain Loc --      Pain Edu? --      Excl. in [place]? --    No data found.  Updated Vital Signs BP 136/81 (BP Location: Right Arm)   Pulse 73   Temp 99.9 F (37.7 C) (Oral)   Resp 20   SpO2 94%   Visual Acuity Right Eye Distance:   Left Eye Distance:   Bilateral Distance:    Right Eye Near:   Left Eye Near:    Bilateral Near:     Physical Exam Vitals and nursing note reviewed.  Constitutional:      Appearance: Normal appearance.  HENT:     Head: Atraumatic.     Right Ear: Tympanic membrane and external ear normal.     Left  Ear: Tympanic membrane and external ear normal.     Nose: Rhinorrhea present.     Mouth/Throat:     Mouth: Mucous membranes are moist.     Pharynx: Posterior oropharyngeal erythema present.  Eyes:     Extraocular Movements: Extraocular movements intact.     Conjunctiva/sclera: Conjunctivae normal.  Cardiovascular:     Rate and Rhythm: Normal rate and regular rhythm.     Heart sounds: Normal heart sounds.  Pulmonary:     Effort: Pulmonary effort is normal.     Breath sounds: Wheezing present.     Comments: Trace wheezes bilaterally Musculoskeletal:        General: Normal range of motion.     Cervical back: Normal range of motion and neck supple.  Skin:    General: Skin is warm and dry.  Neurological:     Mental Status: She is alert and oriented to person, place, and time.  Psychiatric:        Mood and Affect: Mood normal.        Thought Content: Thought content normal.      UC Treatments / Results  Labs (all labs ordered are listed, but only abnormal results are displayed) Labs Reviewed  RESP PANEL BY RT-PCR (FLU A&B, COVID) [id number]    EKG   Radiology No results found.  Procedures Procedures (including critical care time)  Medications Ordered in UC Medications - No data to display  Initial Impression / Assessment and Plan / UC Course  I have reviewed the triage vital signs and the nursing notes.  Pertinent labs &  imaging results that were available during my care of the patient were reviewed by me and considered in my medical decision making (see chart for details).     Vital signs overall reassuring, exam suspicious for viral upper respiratory infection.  This appears to be causing a slight COPD exacerbation.  Treat with Phenergan DM, albuterol inhaler, supportive over-the-counter medications and home care.  Good candidate for antiviral therapy if positive results.  Final Clinical Impressions(s) / UC Diagnoses   Final diagnoses:  Viral URI with cough   Fever, unspecified  Generalized body aches  COPD exacerbation [facility])   Discharge Instructions   None    ED Prescriptions     Medication Sig Dispense Auth. Provider   albuterol (VENTOLIN HFA) 108 (90 Base) MCG/ACT inhaler Inhale 2 puffs into the lungs every 4 (four) hours as needed for wheezing or shortness of breath. [place], [place]   promethazine-dextromethorphan (PROMETHAZINE-DM) 6.25-15 MG/5ML syrup Take 5 mLs by mouth 4 (four) times daily as needed. 100 mL Volney American, [place]      PDMP not reviewed this encounter.   Volney American, [place] 09/19/22 1539

## 2022-09-20 ENCOUNTER — Emergency Department (HOSPITAL_BASED_OUTPATIENT_CLINIC_OR_DEPARTMENT_OTHER)
Admission: EM | Admit: 2022-09-20 | Discharge: 2022-09-20 | Disposition: A | Discharge status: Home / Self Care | Payer: 59 | Attending: Emergency Medicine | Admitting: Emergency Medicine

## 2022-09-20 ENCOUNTER — Encounter (HOSPITAL_BASED_OUTPATIENT_CLINIC_OR_DEPARTMENT_OTHER): Payer: Self-pay | Admitting: Emergency Medicine

## 2022-09-20 ENCOUNTER — Other Ambulatory Visit: Payer: Self-pay

## 2022-09-20 ENCOUNTER — Emergency Department (HOSPITAL_BASED_OUTPATIENT_CLINIC_OR_DEPARTMENT_OTHER): Payer: 59

## 2022-09-20 DIAGNOSIS — D72819 Decreased white blood cell count, unspecified: Secondary | ICD-10-CM | POA: Diagnosis not present

## 2022-09-20 DIAGNOSIS — J189 Pneumonia, unspecified organism: Secondary | ICD-10-CM | POA: Insufficient documentation

## 2022-09-20 DIAGNOSIS — Z7951 Long term (current) use of inhaled steroids: Secondary | ICD-10-CM | POA: Insufficient documentation

## 2022-09-20 DIAGNOSIS — J449 Chronic obstructive pulmonary disease, unspecified: Secondary | ICD-10-CM | POA: Insufficient documentation

## 2022-09-20 DIAGNOSIS — Z7952 Long term (current) use of systemic steroids: Secondary | ICD-10-CM | POA: Diagnosis not present

## 2022-09-20 DIAGNOSIS — Z7982 Long term (current) use of aspirin: Secondary | ICD-10-CM | POA: Insufficient documentation

## 2022-09-20 DIAGNOSIS — J101 Influenza due to other identified influenza virus with other respiratory manifestations: Secondary | ICD-10-CM | POA: Diagnosis not present

## 2022-09-20 DIAGNOSIS — R059 Cough, unspecified: Secondary | ICD-10-CM | POA: Diagnosis present

## 2022-09-20 DIAGNOSIS — J111 Influenza due to unidentified influenza virus with other respiratory manifestations: Secondary | ICD-10-CM

## 2022-09-20 LAB — CBC WITH DIFFERENTIAL/PLATELET
Abs Immature Granulocytes: 0.01 10*3/uL (ref 0.00–0.07)
Basophils Absolute: 0 10*3/uL (ref 0.0–0.1)
Basophils Relative: 1 %
Eosinophils Absolute: 0 10*3/uL (ref 0.0–0.5)
Eosinophils Relative: 1 %
HCT: 37 % (ref 36.0–46.0)
Hemoglobin: 12.6 g/dL (ref 12.0–15.0)
Immature Granulocytes: 0 %
Lymphocytes Relative: 20 %
Lymphs Abs: 0.5 10*3/uL — ABNORMAL LOW (ref 0.7–4.0)
MCH: 28.3 pg (ref 26.0–34.0)
MCHC: 34.1 g/dL (ref 30.0–36.0)
MCV: 83 fL (ref 80.0–100.0)
Monocytes Absolute: 0.3 10*3/uL (ref 0.1–1.0)
Monocytes Relative: 12 %
Neutro Abs: 1.8 10*3/uL (ref 1.7–7.7)
Neutrophils Relative %: 66 %
Platelets: 200 10*3/uL (ref 150–400)
RBC: 4.46 MIL/uL (ref 3.87–5.11)
RDW: 14.7 % (ref 11.5–15.5)
WBC: 2.7 10*3/uL — ABNORMAL LOW (ref 4.0–10.5)
nRBC: 0 % (ref 0.0–0.2)

## 2022-09-20 LAB — BASIC METABOLIC PANEL
Anion gap: 8 (ref 5–15)
BUN: 11 mg/dL (ref 8–23)
CO2: 28 mmol/L (ref 22–32)
Calcium: 8.9 mg/dL (ref 8.9–10.3)
Chloride: 100 mmol/L (ref 98–111)
Creatinine, Ser: 0.96 mg/dL (ref 0.44–1.00)
GFR, Estimated: >60 mL/min (ref >60)
Glucose, Bld: 88 mg/dL (ref 70–99)
Potassium: 3.5 mmol/L (ref 3.5–5.1)
Sodium: 136 mmol/L (ref 135–145)

## 2022-09-20 LAB — TROPONIN I (HIGH SENSITIVITY): Troponin I (High Sensitivity): 3 ng/L (ref <18)

## 2022-09-20 MED ORDER — OSELTAMIVIR PHOSPHATE 30 MG PO CAPS: 30.0000 mg | ORAL_CAPSULE | Freq: Two times a day (BID) | ORAL | 0 refills | Status: AC

## 2022-09-20 MED ORDER — AMOXICILLIN-POT CLAVULANATE 875-125 MG PO TABS: 1.0000 | ORAL_TABLET | Freq: Two times a day (BID) | ORAL | 0 refills | Status: AC

## 2022-09-20 MED ORDER — SODIUM CHLORIDE 0.9 % IV SOLN
500.0000 mg | Freq: Once | INTRAVENOUS | Status: AC
  Administered 2022-09-20: 500 mg via INTRAVENOUS
  Filled 2022-09-20: qty 5

## 2022-09-20 MED ORDER — DOXYCYCLINE HYCLATE 100 MG PO CAPS: 100.0000 mg | ORAL_CAPSULE | Freq: Two times a day (BID) | ORAL | 0 refills | Status: AC

## 2022-09-20 MED ORDER — PREDNISONE 10 MG PO TABS: 60.0000 mg | ORAL_TABLET | Freq: Every day | ORAL | 0 refills | Status: AC

## 2022-09-20 MED ORDER — SODIUM CHLORIDE 0.9 % IV SOLN
1.0000 g | Freq: Once | INTRAVENOUS | Status: AC
  Administered 2022-09-20: 1 g via INTRAVENOUS
  Filled 2022-09-20: qty 10

## 2022-09-20 MED ORDER — SODIUM CHLORIDE 0.9 % IV SOLN: INTRAVENOUS | Status: DC | PRN

## 2022-09-20 NOTE — ED Provider Notes (Signed)
[facility] EMERGENCY DEPT Provider Note   CSN: [id number] Arrival date & time: 09/20/22  8338     History  Chief Complaint  Patient presents with   Influenza    Emma Wilson is a 66 y.o. female.   Influenza    66 year old female with medical history significant for COPD, GERD, fibromyalgia, depression, recent diagnosis of influenza who presents to the emergency department with worsening cough, chills, muscle aches, shortness of breath.  The patient states that she traveled to [facility] on an airplane on Friday.  Return, she went to urgent care in [place] due to 1 day of fever, chills, body aches, fatigue, congestion and cough.  During that time, she tested positive via PCR testing for influenza A.  She states that her symptoms have been worsening since that time so she presents to the emergency department for further evaluation.  She was discharged on albuterol inhaler and promethazine.  Home Medications Prior to Admission medications   Medication Sig Start Date End Date Taking? Authorizing Provider  amoxicillin-clavulanate (AUGMENTIN) 875-125 MG tablet Take 1 tablet by mouth every 12 (twelve) hours for 5 days. 09/20/22 09/25/22 Yes Regan Lemming, MD  doxycycline (VIBRAMYCIN) 100 MG capsule Take 1 capsule (100 mg total) by mouth 2 (two) times daily for 5 days. 09/20/22 09/25/22 Yes Regan Lemming, MD  oseltamivir (TAMIFLU) 30 MG capsule Take 1 capsule (30 mg total) by mouth 2 (two) times daily for 5 days. 09/20/22 09/25/22 Yes Regan Lemming, MD  predniSONE (DELTASONE) 10 MG tablet Take 6 tablets (60 mg total) by mouth daily for 5 days. 09/20/22 09/25/22 Yes Regan Lemming, MD  albuterol (VENTOLIN HFA) 108 (90 Base) MCG/ACT inhaler Inhale 2 puffs into the lungs every 4 (four) hours as needed for wheezing or shortness of breath. 09/18/22   Volney American, PA-C  aspirin EC 81 MG tablet Take 81 mg by mouth daily. Swallow whole.    [provider]   diclofenac sodium (VOLTAREN) 1 % GEL Apply 1 application topically 3 (three) times daily as needed (pain).    [provider]  ezetimibe (ZETIA) 10 MG tablet Take 1 tablet (10 mg total) by mouth daily. 03/17/22 06/15/22  Ledora Bottcher, PA  famotidine (PEPCID) 20 MG tablet Take 1 tablet (20 mg total) by mouth 2 (two) times daily. 03/13/22   Godfrey Pick, MD  nicotine (NICODERM CQ) 14 mg/24hr patch Place 1 patch (14 mg total) onto the skin daily. 01/11/22   Lelon Perla, MD  nicotine (NICODERM CQ) 7 mg/24hr patch Place 1 patch (7 mg total) onto the skin daily. 01/11/22   Lelon Perla, MD  OVER THE COUNTER MEDICATION Apply 1 application. topically daily as needed (pain). Cannabidiol    [provider]  Polyvinyl Alcohol-Povidone (REFRESH OP) Place 1 drop into both eyes daily as needed (dry eyes).    [provider]  pregabalin (LYRICA) 25 MG capsule Take 25 mg by mouth 3 (three) times daily as needed (fibromyalgia).    [provider]  promethazine-dextromethorphan (PROMETHAZINE-DM) 6.25-15 MG/5ML syrup Take 5 mLs by mouth 4 (four) times daily as needed. 09/18/22   Volney American, PA-C  rosuvastatin (CRESTOR) 40 MG tablet Take 40 mg by mouth daily.    [provider]  SYNTHROID 137 MCG tablet Take 1 tablet (137 mcg total) by mouth daily before breakfast. 07/27/16   Tysinger, Camelia Eng, PA-C  tiZANidine (ZANAFLEX) 4 MG tablet Take 4 mg by mouth 2 (two) times daily as needed  for muscle spasms. 08/17/18   [provider]  traMADol (ULTRAM) 50 MG tablet Take 50 mg by mouth 3 (three) times daily as needed for moderate pain. 07/08/21   [provider]      Allergies    Patient has no known allergies.    Review of Systems   Review of Systems  All other systems reviewed and are negative.   Physical Exam Updated Vital Signs BP (!) 124/92   Pulse (!) 124   Temp 99.3 F (37.4 C)   Resp 18   Ht '5\' 7"'$  (1.702 m)   Wt 57.6 kg    SpO2 92%   BMI 19.89 kg/m  Physical Exam Vitals and nursing note reviewed.  Constitutional:      General: She is not in acute distress.    Appearance: She is well-developed. She is not ill-appearing.  HENT:     Head: Normocephalic and atraumatic.  Eyes:     Conjunctiva/sclera: Conjunctivae normal.  Cardiovascular:     Rate and Rhythm: Normal rate and regular rhythm.  Pulmonary:     Effort: Pulmonary effort is normal. No respiratory distress.     Breath sounds: Normal breath sounds.  Abdominal:     Palpations: Abdomen is soft.     Tenderness: There is no abdominal tenderness.  Musculoskeletal:        General: No swelling.     Cervical back: Neck supple.  Skin:    General: Skin is warm and dry.     Capillary Refill: Capillary refill takes less than 2 seconds.  Neurological:     Mental Status: She is alert.  Psychiatric:        Mood and Affect: Mood normal.     ED Results / Procedures / Treatments   Labs (all labs ordered are listed, but only abnormal results are displayed) Labs Reviewed  CBC WITH DIFFERENTIAL/PLATELET - Abnormal; Notable for the following components:      Result Value   WBC 2.7 (*)    Lymphs Abs 0.5 (*)    All other components within normal limits  BASIC METABOLIC PANEL  TROPONIN I (HIGH SENSITIVITY)  TROPONIN I (HIGH SENSITIVITY)    EKG EKG Interpretation  Date/Time:  Monday September 20 2022 08:02:06 EST Ventricular Rate:  66 PR Interval:  146 QRS Duration: 66 QT Interval:  346 QTC Calculation: 362 R Axis:   72 Text Interpretation: Normal sinus rhythm Nonspecific T wave abnormality Abnormal ECG When compared with ECG of 29-Jun-2022 18:07, Nonspecific T wave abnormality now evident in Anterolateral leads Confirmed by Regan Lemming ([id number]) on 09/20/2022 8:25:55 AM  Radiology DG Chest Portable 1 View  Result Date: 09/20/2022 CLINICAL DATA:  Productive cough, fever EXAM: PORTABLE CHEST 1 VIEW COMPARISON:  Previous studies including the  examination of 06/29/2022 FINDINGS: Cardiac size is within normal limits. There is interval appearance of linear infiltrate in right lower lung field. There are no signs of alveolar pulmonary edema. There is no pleural effusion or pneumothorax. Coronary artery calcifications are seen. Surgical clips are seen in thyroid bed. IMPRESSION: New linear patchy infiltrate in right lower lung fields suggests atelectasis/pneumonia. Electronically Signed   By: Elmer Picker M.D.   On: 09/20/2022 08:43    Procedures Procedures    Medications Ordered in ED Medications  azithromycin (ZITHROMAX) 500 mg in sodium chloride 0.9 % 250 mL IVPB (500 mg Intravenous New Bag/Given 09/20/22 0949)  0.9 %  sodium chloride infusion ( Intravenous New Bag/Given 09/20/22 0911)  cefTRIAXone (ROCEPHIN)  1 g in sodium chloride 0.9 % 100 mL IVPB (0 g Intravenous Stopped 09/20/22 0949)    ED Course/ Medical Decision Making/ A&P                           Medical Decision Making Amount and/or Complexity of Data Reviewed Labs: ordered. Radiology: ordered.  Risk Prescription drug management.    66 year old female with medical history significant for COPD, GERD, fibromyalgia, depression, recent diagnosis of influenza who presents to the emergency department with worsening cough, chills, muscle aches, shortness of breath.  The patient states that she traveled to [facility] on an airplane on Friday.  Return, she went to urgent care in [place] due to 1 day of fever, chills, body aches, fatigue, congestion and cough.  During that time, she tested positive via PCR testing for influenza A.  She states that her symptoms have been worsening since that time so she presents to the emergency department for further evaluation.  She was discharged on albuterol inhaler and promethazine.  On arrival, the patient was vitally stable, afebrile, temperature 99.3, not tachycardic or tachypneic, saturating 98% on room air, normotensive.  Sinus rhythm  noted on cardiac telemetry.  Patient presenting with persistent symptoms of influenza versus developing community-acquired pneumonia versus COPD exacerbation.  Patient has been having some chest tightness described as a heaviness since this past Friday.  No change in symptoms.  Productive cough noted.  Laboratory evaluation significant for leukopenia with a CBC with a pia to 2.7, no anemia, hemoglobin 12.6, initial troponin negative at 3, BMP unremarkable.  An EKG was performed which revealed sinus rhythm, nonspecific T wave changes present compared to prior EKG in August 2023, no evidence of STEMI.  Chest x-ray was performed and revealed the following: IMPRESSION:  New linear patchy infiltrate in right lower lung fields suggests  atelectasis/pneumonia.   Patient was covered for developing community-acquired pneumonia with IV Rocephin and azithromycin.  On repeat evaluation, the patient was feeling symptomatically improved.  She was in no respiratory distress, vitally stable.  No wheezing on exam.  We will treat the patient with Tamiflu, course of antibiotics for possible developing superimposed community-acquired pneumonia in addition to her influenza.  Given her history of COPD, will also advised continued albuterol at home with a course of steroids.  Overall stable for discharge with return precautions provided.  DC Instructions: Your symptoms are consistent with likely influenza infection however your x-ray showing developing infiltrate concerning for possible superimposed community-acquired pneumonia for which we will treat with antibiotics.  We will also treat your influenza with Tamiflu.  Take your antibiotics on a full stomach or with plenty of water to avoid nausea.  Given your history of COPD, will also treat with a steroid burst.  Continue your albuterol inhaler at home.   Final Clinical Impression(s) / ED Diagnoses Final diagnoses:  Influenza  Community acquired pneumonia, unspecified  laterality    Rx / DC Orders ED Discharge Orders          Ordered    amoxicillin-clavulanate (AUGMENTIN) 875-125 MG tablet  Every 12 hours        09/20/22 1002    doxycycline (VIBRAMYCIN) 100 MG capsule  2 times daily        09/20/22 1002    oseltamivir (TAMIFLU) 30 MG capsule  2 times daily        09/20/22 1002    predniSONE (DELTASONE) 10 MG tablet  Daily  09/20/22 1007              Regan Lemming, MD 09/20/22 1010

## 2022-09-20 NOTE — ED Notes (Signed)
Discharge instructions, follow up care, and prescriptions reviewed and explained, pt verbalized understanding. Pt caox4 and ambulatory on d/c.

## 2022-09-20 NOTE — Discharge Instructions (Addendum)
Your symptoms are consistent with likely influenza infection however your x-ray showing developing infiltrate concerning for possible superimposed community-acquired pneumonia for which we will treat with antibiotics.  We will also treat your influenza with Tamiflu.  Take your antibiotics on a full stomach or with plenty of water to avoid nausea.  Given your history of COPD, will also treat with a steroid burst.  Continue your albuterol inhaler at home.

## 2022-09-20 NOTE — ED Triage Notes (Signed)
Pt arrived POV, ambulatory, and no obvious resp distress. Pt c/o chest congestion, pain on respiration, and productive cough since Friday after traveling.   Pt was evaluated at UC 2 days ago, Flu A +   Pt states she came in today with concern of "heaviness in the chest."

## 2022-11-12 ENCOUNTER — Other Ambulatory Visit: Payer: Self-pay

## 2022-11-12 ENCOUNTER — Emergency Department (HOSPITAL_BASED_OUTPATIENT_CLINIC_OR_DEPARTMENT_OTHER): Payer: 59

## 2022-11-12 ENCOUNTER — Emergency Department (HOSPITAL_BASED_OUTPATIENT_CLINIC_OR_DEPARTMENT_OTHER)
Admission: EM | Admit: 2022-11-12 | Discharge: 2022-11-12 | Disposition: A | Discharge status: Home / Self Care | Payer: 59 | Attending: Emergency Medicine | Admitting: Emergency Medicine

## 2022-11-12 ENCOUNTER — Emergency Department (HOSPITAL_BASED_OUTPATIENT_CLINIC_OR_DEPARTMENT_OTHER): Payer: 59 | Admitting: Radiology

## 2022-11-12 DIAGNOSIS — Z7982 Long term (current) use of aspirin: Secondary | ICD-10-CM | POA: Insufficient documentation

## 2022-11-12 DIAGNOSIS — J449 Chronic obstructive pulmonary disease, unspecified: Secondary | ICD-10-CM | POA: Insufficient documentation

## 2022-11-12 DIAGNOSIS — R5383 Other fatigue: Secondary | ICD-10-CM | POA: Diagnosis not present

## 2022-11-12 DIAGNOSIS — R001 Bradycardia, unspecified: Secondary | ICD-10-CM | POA: Diagnosis not present

## 2022-11-12 DIAGNOSIS — E039 Hypothyroidism, unspecified: Secondary | ICD-10-CM | POA: Insufficient documentation

## 2022-11-12 DIAGNOSIS — Z20822 Contact with and (suspected) exposure to covid-19: Secondary | ICD-10-CM | POA: Insufficient documentation

## 2022-11-12 DIAGNOSIS — Z79899 Other long term (current) drug therapy: Secondary | ICD-10-CM | POA: Insufficient documentation

## 2022-11-12 DIAGNOSIS — R0602 Shortness of breath: Secondary | ICD-10-CM | POA: Insufficient documentation

## 2022-11-12 DIAGNOSIS — R0789 Other chest pain: Secondary | ICD-10-CM | POA: Diagnosis not present

## 2022-11-12 DIAGNOSIS — R0600 Dyspnea, unspecified: Secondary | ICD-10-CM | POA: Insufficient documentation

## 2022-11-12 LAB — BASIC METABOLIC PANEL
Anion gap: 9 (ref 5–15)
BUN: 8 mg/dL (ref 8–23)
CO2: 25 mmol/L (ref 22–32)
Calcium: 9.6 mg/dL (ref 8.9–10.3)
Chloride: 104 mmol/L (ref 98–111)
Creatinine, Ser: 0.9 mg/dL (ref 0.44–1.00)
GFR, Estimated: >60 mL/min (ref >60)
Glucose, Bld: 85 mg/dL (ref 70–99)
Potassium: 3.8 mmol/L (ref 3.5–5.1)
Sodium: 138 mmol/L (ref 135–145)

## 2022-11-12 LAB — CBC
HCT: 36.6 % (ref 36.0–46.0)
Hemoglobin: 12.6 g/dL (ref 12.0–15.0)
MCH: 29 pg (ref 26.0–34.0)
MCHC: 34.4 g/dL (ref 30.0–36.0)
MCV: 84.1 fL (ref 80.0–100.0)
Platelets: 241 10*3/uL (ref 150–400)
RBC: 4.35 MIL/uL (ref 3.87–5.11)
RDW: 15.2 % (ref 11.5–15.5)
WBC: 3.1 10*3/uL — ABNORMAL LOW (ref 4.0–10.5)
nRBC: 0 % (ref 0.0–0.2)

## 2022-11-12 LAB — RESP PANEL BY RT-PCR (RSV, FLU A&B, COVID)  RVPGX2
Influenza A by PCR: NEGATIVE
Influenza B by PCR: NEGATIVE
Resp Syncytial Virus by PCR: NEGATIVE
SARS Coronavirus 2 by RT PCR: NEGATIVE

## 2022-11-12 LAB — TROPONIN I (HIGH SENSITIVITY): Troponin I (High Sensitivity): <2 ng/L (ref <18)

## 2022-11-12 LAB — D-DIMER, QUANTITATIVE: D-Dimer, Quant: 0.68 ug/mL-FEU — ABNORMAL HIGH (ref 0.00–0.50)

## 2022-11-12 MED ORDER — IOHEXOL 350 MG/ML SOLN
75.0000 mL | Freq: Once | INTRAVENOUS | Status: AC | PRN
  Administered 2022-11-12: 75 mL via INTRAVENOUS

## 2022-11-12 NOTE — ED Triage Notes (Addendum)
Pt presents for R sided rib pain that is worse with inspiration. Pt works for the [employer] and originally thought she had pulled a muscle when the pain started Wednesday.  H/o cardiac stents in 2022, PNA in November Endorses fatigue (but also states she has been working long hours) Denies dizziness, N/V, diaphoresis

## 2022-11-12 NOTE — ED Notes (Signed)
Patient verbalizes understanding of discharge instructions. Opportunity for questioning and answers were provided. Patient discharged from ED.  °

## 2022-11-12 NOTE — ED Notes (Signed)
Patient transported to CT 

## 2022-11-12 NOTE — ED Provider Notes (Signed)
[facility] EMERGENCY DEPT Provider Note   CSN: [id number] Arrival date & time: 11/12/22  6948     History  Chief Complaint  Patient presents with   Chest Pain    Emma Wilson is a 66 y.o. female.   Chest Pain Patient presents with chest pain.  Anterior chest.  Feels little short of breath.  Started a few days ago.  Thought she may have pulled a muscle.  Not exertional but does have more fatigue.  States she works at post [employer] and has been very busy.  No swelling in her legs.  No cough.    Past Medical History:  Diagnosis Date   Abnormality of gait 03/19/2014   one leg shorter than other"uses cane frequently"   Carpal tunnel syndrome    right wrist   Chronic back pain    DDD   COPD (chronic obstructive pulmonary disease) ([place])    Depression 2006   "after my son passed" (12/05/2013)   Fibromyalgia    GERD (gastroesophageal reflux disease)    Hematuria    microscopic hematuria (urol & nephrol w/u neg), multiple UTI`s, glomerular from [facility], Dr. Jeffie Pollock 2014   Hyperlipidemia    managed by Dr. Ouida Sills   Hypothyroidism    Leg length inequality    sees rheumatology   Migraine    "q 3-4 months" (12/05/2013)   Osteoarthritis    Rheumatoid and OA?  Dr. Titus Mould ([facility], rheumatology)   Pneumonia    "abuntantly as a child"   Rheumatoid arthritis ([place])    Dr. Titus Mould   Shortness of breath    "at rest" (12/05/2013)   Sinus bradycardia    Thyroid cancer ([place])    S/P radiation & OR    Home Medications Prior to Admission medications   Medication Sig Start Date End Date Taking? Authorizing Provider  albuterol (VENTOLIN HFA) 108 (90 Base) MCG/ACT inhaler Inhale 2 puffs into the lungs every 4 (four) hours as needed for wheezing or shortness of breath. 09/18/22   Volney American, PA-C  aspirin EC 81 MG tablet Take 81 mg by mouth daily. Swallow whole.    [provider]  diclofenac sodium (VOLTAREN) 1 % GEL Apply 1 application topically 3  (three) times daily as needed (pain).    [provider]  ezetimibe (ZETIA) 10 MG tablet Take 1 tablet (10 mg total) by mouth daily. 03/17/22 06/15/22  Ledora Bottcher, PA  famotidine (PEPCID) 20 MG tablet Take 1 tablet (20 mg total) by mouth 2 (two) times daily. 03/13/22   Godfrey Pick, MD  nicotine (NICODERM CQ) 14 mg/24hr patch Place 1 patch (14 mg total) onto the skin daily. 01/11/22   Lelon Perla, MD  nicotine (NICODERM CQ) 7 mg/24hr patch Place 1 patch (7 mg total) onto the skin daily. 01/11/22   Lelon Perla, MD  OVER THE COUNTER MEDICATION Apply 1 application. topically daily as needed (pain). Cannabidiol    [provider]  Polyvinyl Alcohol-Povidone (REFRESH OP) Place 1 drop into both eyes daily as needed (dry eyes).    [provider]  pregabalin (LYRICA) 25 MG capsule Take 25 mg by mouth 3 (three) times daily as needed (fibromyalgia).    [provider]  promethazine-dextromethorphan (PROMETHAZINE-DM) 6.25-15 MG/5ML syrup Take 5 mLs by mouth 4 (four) times daily as needed. 09/18/22   Volney American, PA-C  rosuvastatin (CRESTOR) 40 MG tablet Take 40 mg by mouth daily.    [provider]  SYNTHROID 137 MCG tablet Take 1 tablet (137 mcg total) by mouth daily before breakfast. 07/27/16   Tysinger, Camelia Eng, PA-C  tiZANidine (ZANAFLEX) 4 MG tablet Take 4 mg by mouth 2 (two) times daily as needed for muscle spasms. 08/17/18   [provider]  traMADol (ULTRAM) 50 MG tablet Take 50 mg by mouth 3 (three) times daily as needed for moderate pain. 07/08/21   [provider]      Allergies    Patient has no known allergies.    Review of Systems   Review of Systems  Cardiovascular:  Positive for chest pain.    Physical Exam Updated Vital Signs BP (!) 144/106   Pulse (!) 48   Temp 98 F (36.7 C) (Oral)   Resp 17   Wt 58.1 kg   SpO2 100%   BMI 20.05 kg/m  Physical Exam Vitals and nursing note reviewed.   Cardiovascular:     Rate and Rhythm: Regular rhythm. Bradycardia present.  Pulmonary:     Breath sounds: No decreased breath sounds.  Chest:     Chest wall: No tenderness.  Musculoskeletal:     Cervical back: Neck supple.     Right lower leg: No edema.     Left lower leg: No edema.  Neurological:     Mental Status: She is alert.     ED Results / Procedures / Treatments   Labs (all labs ordered are listed, but only abnormal results are displayed) Labs Reviewed  CBC - Abnormal; Notable for the following components:      Result Value   WBC 3.1 (*)    All other components within normal limits  D-DIMER, QUANTITATIVE - Abnormal; Notable for the following components:   D-Dimer, Quant 0.68 (*)    All other components within normal limits  RESP PANEL BY RT-PCR (RSV, FLU A&B, COVID)  [id number]  BASIC METABOLIC PANEL  TROPONIN I (HIGH SENSITIVITY)    EKG EKG Interpretation  Date/Time:  Friday November 12 2022 08:58:53 EST Ventricular Rate:  54 PR Interval:  166 QRS Duration: 66 QT Interval:  342 QTC Calculation: 324 R Axis:   49 Text Interpretation: Sinus bradycardia Nonspecific ST and T wave abnormality Abnormal ECG When compared with ECG of 20-Sep-2022 08:02, No significant change was found Confirmed by Davonna Belling [phone]) on 11/12/2022 9:54:32 AM  Radiology CT Angio Chest PE W and/or Wo Contrast  Result Date: 11/12/2022 CLINICAL DATA:  Chest pain and shortness of breath. EXAM: CT ANGIOGRAPHY CHEST WITH CONTRAST TECHNIQUE: Multidetector CT imaging of the chest was performed using the standard protocol during bolus administration of intravenous contrast. Multiplanar CT image reconstructions and MIPs were obtained to evaluate the vascular anatomy. RADIATION DOSE REDUCTION: This exam was performed according to the departmental dose-optimization program which includes automated exposure control, adjustment of the mA and/or kV according to patient size and/or use of iterative  reconstruction technique. CONTRAST:  71m OMNIPAQUE IOHEXOL 350 MG/ML SOLN COMPARISON:  08/04/2021 FINDINGS: Cardiovascular: The heart is normal in size. No pericardial effusion. Mild tortuosity and scattered calcification of the thoracic aorta. Three-vessel coronary artery calcifications. The pulmonary arterial tree is well opacified. No filling defects to suggest pulmonary embolism. Mediastinum/Nodes: No mediastinal or hilar mass or lymphadenopathy. The esophagus is grossly normal. Lungs/Pleura: Stable significant emphysematous changes and associated pulmonary scarring. Bibasilar subsegmental atelectasis is noted along with underlying chronic basilar scarring changes. No pleural effusions. No worrisome pulmonary lesions or pulmonary nodules. The central tracheobronchial tree is unremarkable. Upper Abdomen:  No significant upper abdominal findings. Musculoskeletal: No breast masses, supraclavicular or axillary adenopathy. The bony thorax is intact. No sternal, rib or thoracic vertebral body fractures are identified. Review of the MIP images confirms the above findings. IMPRESSION: 1. No CT findings for pulmonary embolism. 2. Stable significant emphysematous changes and associated pulmonary scarring. 3. Bibasilar subsegmental atelectasis along with underlying chronic basilar scarring changes. 4. No worrisome pulmonary lesions or pulmonary nodules. 5. No mediastinal or hilar mass or adenopathy. 6. Three-vessel coronary artery calcifications. Aortic Atherosclerosis (ICD10-I70.0) and Emphysema (ICD10-J43.9). Electronically Signed   By: Marijo Sanes M.D.   On: 11/12/2022 11:30   DG Chest 2 View  Result Date: 11/12/2022 CLINICAL DATA:  Chest pain EXAM: CHEST - 2 VIEW COMPARISON:  09/20/22 CXR FINDINGS: No pleural effusion. No pneumothorax. Normal cardiac and mediastinal contours. Bibasilar atelectasis. No displaced rib fractures. Visualized upper abdomen is unremarkable. Vertebral body heights are maintained.  IMPRESSION: Bibasilar atelectasis. Electronically Signed   By: Marin Roberts M.D.   On: 11/12/2022 09:29    Procedures Procedures    Medications Ordered in ED Medications  iohexol (OMNIPAQUE) 350 MG/ML injection 75 mL (75 mLs Intravenous Contrast Given 11/12/22 1105)    ED Course/ Medical Decision Making/ A&P                           Medical Decision Making Amount and/or Complexity of Data Reviewed Labs: ordered. Radiology: ordered.  Risk Prescription drug management.   Patient with anterior chest pain and shortness of breath.  Feels as if it hurts if she takes a deep breath.  Pleuritic pain.  Chest x-ray reassuring.  However D-dimer is positive.  Will get CT angiography to evaluate.  CT angiography reassuring.  No pulm embolism seen.  Feeling somewhat better.  Does have cardiac history and reviewed cath note.  Doubt this is an anginal equivalent but I think follow-up with cardiology would be indicated.  Negative viral testing.  Will discharge home.        Final Clinical Impression(s) / ED Diagnoses Final diagnoses:  Dyspnea, unspecified type    Rx / DC Orders ED Discharge Orders     None         Davonna Belling, MD 11/12/22 1208

## 2023-04-19 ENCOUNTER — Encounter (HOSPITAL_COMMUNITY): Payer: Self-pay | Admitting: *Deleted

## 2023-04-19 ENCOUNTER — Other Ambulatory Visit: Payer: Self-pay

## 2023-04-19 ENCOUNTER — Ambulatory Visit (HOSPITAL_COMMUNITY)
Admission: EM | Admit: 2023-04-19 | Discharge: 2023-04-19 | Disposition: A | Discharge status: Home / Self Care | Payer: 59 | Attending: Internal Medicine | Admitting: Internal Medicine

## 2023-04-19 DIAGNOSIS — S39012A Strain of muscle, fascia and tendon of lower back, initial encounter: Secondary | ICD-10-CM | POA: Diagnosis not present

## 2023-04-19 MED ORDER — METHOCARBAMOL 500 MG PO TABS: 250.0000 mg | ORAL_TABLET | Freq: Two times a day (BID) | ORAL | 0 refills | Status: DC

## 2023-04-19 NOTE — ED Provider Notes (Signed)
[facility]    CSN: [id number] Arrival date & time: 04/19/23  0940      History   Chief Complaint Chief Complaint  Patient presents with   Hip Pain    HPI Emma Wilson is a 67 y.o. female.   Patient presents to urgent care for evaluation of "hip pain" that started 2 days ago without known injury/trauma on April 17, 2023. Describes pain as hip pain, but points to the right lower back when describing pain location. Pain is described as a sharp ache and currently 5/10. Pain is significantly worsened by movement and improves with rest and heat. Denies urinary symptoms, urinary/stool incontinence, numbness/tingling to the bilateral lower extremities, saddle anesthesia symptoms, weakness, headache, blurry vision, dizziness, and fever/chills. No constipation, diarrhea, nausea, vomiting, or blood/mucous to the stools reported. No recent falls, injuries, or trauma to the area of greatest tenderness. Denies history of similar pain. She has been using heat as stated above for symptoms. History of fibromyalgia and has access to tramadol as needed for chronic pain. She has taken 2 doses of tramadol over the last 72 hours and states this has helped a little bit.    Hip Pain    Past Medical History:  Diagnosis Date   Abnormality of gait 03/19/2014   one leg shorter than other"uses cane frequently"   Carpal tunnel syndrome    right wrist   Chronic back pain    DDD   COPD (chronic obstructive pulmonary disease) (HCC)    Depression 2006   "after my son passed" (12/05/2013)   Fibromyalgia    GERD (gastroesophageal reflux disease)    Hematuria    microscopic hematuria (urol & nephrol w/u neg), multiple UTI`s, glomerular from Sickle Trait, Dr. Annabell Howells 2014   Hyperlipidemia    managed by Dr. Dareen Piano   Hypothyroidism    Leg length inequality    sees rheumatology   Migraine    "q 3-4 months" (12/05/2013)   Osteoarthritis    Rheumatoid and OA?  Dr. Maude Leriche ([facility], rheumatology)    Pneumonia    "abuntantly as a child"   Rheumatoid arthritis (HCC)    Dr. Maude Leriche   Shortness of breath    "at rest" (12/05/2013)   Sinus bradycardia    Thyroid cancer (HCC)    S/P radiation & OR    Patient Active Problem List   Diagnosis Date Noted   Unstable angina (HCC) 08/06/2021   Abnormal cardiac CT angiography    Coronary artery disease involving native coronary artery of native heart with unstable angina pectoris (HCC)    Chest pain 08/04/2021   Asymptomatic microscopic hematuria 06/26/2017   History of kidney stones 05/24/2017   History of thyroid cancer 05/24/2017   Encounter for health maintenance examination in adult 07/26/2016   Need for prophylactic vaccination and inoculation against influenza 07/26/2016   History of COPD 07/26/2016   Hereditary and idiopathic peripheral neuropathy 07/26/2016   Decreased radial pulse 07/26/2016   Decreased pulses in feet 07/26/2016   Abnormality of gait 03/19/2014   Pure hypercholesterolemia 01/24/2013   Hematuria, unspecified 01/24/2013   Fibromyalgia 01/24/2013   Bradycardia 03/25/2011   TOBACCO ABUSE 02/12/2010   Hypothyroidism 02/11/2010   COPD (chronic obstructive pulmonary disease) (HCC) 02/11/2010   GERD 02/11/2010    Past Surgical History:  Procedure Laterality Date   COLONOSCOPY  10/13/2010   external and internal hemorrhoids, repeat 2021; Dr. Vida Rigger   CORONARY BALLOON ANGIOPLASTY N/A 08/06/2021   Procedure: CORONARY BALLOON  ANGIOPLASTY;  Surgeon: Marykay Lex, MD;  Location: [facility] INVASIVE CV LAB;  Service: Cardiovascular;  Laterality: N/A;   CORONARY PRESSURE/FFR STUDY N/A 08/06/2021   Procedure: INTRAVASCULAR PRESSURE WIRE/FFR STUDY;  Surgeon: Marykay Lex, MD;  Location: [facility] INVASIVE CV LAB;  Service: Cardiovascular;  Laterality: N/A;   CORONARY STENT INTERVENTION N/A 08/06/2021   Procedure: CORONARY STENT INTERVENTION;  Surgeon: Marykay Lex, MD;  Location: [facility] INVASIVE CV LAB;  Service: Cardiovascular;   Laterality: N/A;   CYSTOSCOPY  02/26/2013   Dr. Annabell Howells (normal; follicular cystitis)   EUS N/A 03/18/2016   Procedure: UPPER ENDOSCOPIC ULTRASOUND (EUS) RADIAL;  Surgeon: Rachael Fee, MD;  Location: [facility];  Service: Endoscopy;  Laterality: N/A;   LEFT HEART CATH AND CORONARY ANGIOGRAPHY N/A 08/06/2021   Procedure: LEFT HEART CATH AND CORONARY ANGIOGRAPHY;  Surgeon: Marykay Lex, MD;  Location: [facility] INVASIVE CV LAB;  Service: Cardiovascular;  Laterality: N/A;   THYROIDECTOMY, PARTIAL  11/2005; 11/2006   TOTAL ABDOMINAL HYSTERECTOMY  ~ 1990   due to bleeding   TUBAL LIGATION  1980's    OB History   No obstetric history on file.      Home Medications    Prior to Admission medications   Medication Sig Start Date End Date Taking? Authorizing Provider  aspirin EC 81 MG tablet Take 81 mg by mouth daily. Swallow whole.   Yes [provider]  ezetimibe (ZETIA) 10 MG tablet Take 1 tablet (10 mg total) by mouth daily. 03/17/22 04/19/23 Yes Duke, Roe Rutherford, PA  methocarbamol (ROBAXIN) 500 MG tablet Take 0.5 tablets (250 mg total) by mouth 2 (two) times daily. 04/19/23  Yes Lylie Blacklock, Donavan Burnet, FNP  OVER THE COUNTER MEDICATION Apply 1 application. topically daily as needed (pain). Cannabidiol   Yes [provider]  Polyvinyl Alcohol-Povidone (REFRESH OP) Place 1 drop into both eyes daily as needed (dry eyes).   Yes [provider]  SYNTHROID 137 MCG tablet Take 1 tablet (137 mcg total) by mouth daily before breakfast. 07/27/16  Yes Tysinger, Kermit Balo, PA-C  tiZANidine (ZANAFLEX) 4 MG tablet Take 4 mg by mouth 2 (two) times daily as needed for muscle spasms. 08/17/18  Yes [provider]  traMADol (ULTRAM) 50 MG tablet Take 50 mg by mouth 3 (three) times daily as needed for moderate pain. 07/08/21  Yes [provider]  albuterol (VENTOLIN HFA) 108 (90 Base) MCG/ACT inhaler Inhale 2 puffs into the lungs every 4 (four) hours as needed for wheezing or  shortness of breath. 09/18/22   Particia Nearing, PA-C  diclofenac sodium (VOLTAREN) 1 % GEL Apply 1 application topically 3 (three) times daily as needed (pain).    [provider]  famotidine (PEPCID) 20 MG tablet Take 1 tablet (20 mg total) by mouth 2 (two) times daily. 03/13/22   Gloris Manchester, MD  nicotine (NICODERM CQ) 14 mg/24hr patch Place 1 patch (14 mg total) onto the skin daily. 01/11/22   Lewayne Bunting, MD  nicotine (NICODERM CQ) 7 mg/24hr patch Place 1 patch (7 mg total) onto the skin daily. 01/11/22   Lewayne Bunting, MD  pregabalin (LYRICA) 25 MG capsule Take 25 mg by mouth 3 (three) times daily as needed (fibromyalgia).    [provider]  promethazine-dextromethorphan (PROMETHAZINE-DM) 6.25-15 MG/5ML syrup Take 5 mLs by mouth 4 (four) times daily as needed. 09/18/22   Particia Nearing, PA-C  rosuvastatin (CRESTOR) 40 MG tablet Take 40 mg by mouth daily.  [provider]    Family History Family History  Problem Relation Age of Onset   Stroke Mother    Coronary artery disease Mother    Kidney disease Mother        on dialysis   Heart disease Mother    Cancer Father        multiple myeloma   Arthritis Sister        rheumatoid and osteoarthritis   Fibromyalgia Sister    Arthritis Sister        RA and OA   Diabetes Son     Social History Social History   Tobacco Use   Smoking status: Every Day    Packs/day: 0.50    Years: 35.00    Additional pack years: 0.00    Total pack years: 17.50    Types: Cigarettes   Smokeless tobacco: Never  Substance Use Topics   Alcohol use: Yes    Alcohol/week: 0.0 standard drinks of alcohol    Comment: once to twice a week    Drug use: No     Allergies   Patient has no known allergies.   Review of Systems Review of Systems Per HPI  Physical Exam Triage Vital Signs ED Triage Vitals  Enc Vitals Group     BP 04/19/23 0958 130/80     Pulse Rate 04/19/23 0958 (!) 57     Resp  04/19/23 0958 18     Temp 04/19/23 0958 98.3 F (36.8 C)     Temp src --      SpO2 04/19/23 0958 93 %     Weight --      Height --      Head Circumference --      Peak Flow --      Pain Score 04/19/23 0951 5     Pain Loc --      Pain Edu? --      Excl. in GC? --    No data found.  Updated Vital Signs BP 130/80   Pulse (!) 57   Temp 98.3 F (36.8 C)   Resp 18   SpO2 93%   Visual Acuity Right Eye Distance:   Left Eye Distance:   Bilateral Distance:    Right Eye Near:   Left Eye Near:    Bilateral Near:     Physical Exam Vitals and nursing note reviewed.  Constitutional:      Appearance: She is not ill-appearing or toxic-appearing.  HENT:     Head: Normocephalic and atraumatic.     Right Ear: Hearing and external ear normal.     Left Ear: Hearing and external ear normal.     Nose: Nose normal.     Mouth/Throat:     Lips: Pink.  Eyes:     General: Lids are normal. Vision grossly intact. Gaze aligned appropriately.     Extraocular Movements: Extraocular movements intact.     Conjunctiva/sclera: Conjunctivae normal.  Cardiovascular:     Rate and Rhythm: Normal rate and regular rhythm.     Heart sounds: Normal heart sounds, S1 normal and S2 normal.  Pulmonary:     Effort: Pulmonary effort is normal. No respiratory distress.     Breath sounds: Normal breath sounds and air entry.  Abdominal:     General: Bowel sounds are normal.     Palpations: Abdomen is soft.     Tenderness: There is no abdominal tenderness. There is no right CVA tenderness, left CVA tenderness or  guarding.  Musculoskeletal:     Cervical back: Normal and neck supple.     Thoracic back: Normal.     Lumbar back: Tenderness (TTP to the right sided paraspinals, non-tender to the C, T, or L spine) present. No swelling, edema, deformity, signs of trauma, lacerations, spasms or bony tenderness. Normal range of motion. Negative right straight leg raise test and negative left straight leg raise test. No  scoliosis.     Comments: Walks on heels and toes without difficulty. Strength and sensation intact to distal bilateral lower extremities. Moves all extremities with normal strength and coordination.   Skin:    General: Skin is warm and dry.     Capillary Refill: Capillary refill takes less than 2 seconds.     Findings: No rash.  Neurological:     General: No focal deficit present.     Mental Status: She is alert and oriented to person, place, and time. Mental status is at baseline.     Cranial Nerves: No dysarthria or facial asymmetry.  Psychiatric:        Mood and Affect: Mood normal.        Speech: Speech normal.        Behavior: Behavior normal.        Thought Content: Thought content normal.        Judgment: Judgment normal.      UC Treatments / Results  Labs (all labs ordered are listed, but only abnormal results are displayed) Labs Reviewed - No data to display  EKG   Radiology No results found.  Procedures Procedures (including critical care time)  Medications Ordered in UC Medications - No data to display  Initial Impression / Assessment and Plan / UC Course  I have reviewed the triage vital signs and the nursing notes.  Pertinent labs & imaging results that were available during my care of the patient were reviewed by me and considered in my medical decision making (see chart for details).   1. Strain of lumbar region Presentation is consistent with acute muscle strain of the lumbar paraspinals that will likely resolve with rest, fluids, as needed use of tylenol and muscle relaxer, heat, and gentle range of motion exercises. May take 1/2 tablet (250mg ) robaxin every 12 hours as needed for muscle spasm. Drowsiness precautions regarding muscle relaxer use discussed. Heat and gentle ROM exercises discussed. Deferred imaging today based on stable musculoskeletal exam findings and hemodynamically stable vital signs. Walking referral given to orthopedic provider should  symptoms fail to improve in the next 1-2 weeks.   Discussed physical exam and available lab work findings in clinic with patient.  Counseled patient regarding appropriate use of medications and potential side effects for all medications recommended or prescribed today. Discussed red flag signs and symptoms of worsening condition,when to call the PCP office, return to urgent care, and when to seek higher level of care in the emergency department. Patient verbalizes understanding and agreement with plan. All questions answered. Patient discharged in stable condition.    Final Clinical Impressions(s) / UC Diagnoses   Final diagnoses:  Strain of lumbar region, initial encounter     Discharge Instructions      Your pain is likely due to a muscle strain which will improve on its own with time.   - Take tylenol 1,000mg  every 6 hours as needed for pain to the low back. - You may also take the prescribed muscle relaxer as directed as needed for muscle aches/spasm.  Do  not take this medication and drive or drink alcohol as it can make you sleepy.  Mainly use this medicine at nighttime as needed. - Apply heat 20 minutes on then 20 minutes off and perform gentle range of motion exercises to the area of greatest pain to prevent muscle stiffness and provide further pain relief.   Red flag symptoms to watch out for are numbness/tingling to the legs, weakness, loss of bowel/bladder control, and/or worsening pain that does not respond well to medicines. Follow-up with your primary care provider or return to urgent care if your symptoms do not improve in the next 3 to 4 days with medications and interventions recommended today. If your symptoms are severe (red flag), please go to the emergency room.  I hope you feel better!       ED Prescriptions     Medication Sig Dispense Auth. Provider   methocarbamol (ROBAXIN) 500 MG tablet Take 0.5 tablets (250 mg total) by mouth 2 (two) times daily. 10 tablet  Carlisle Beers, FNP      PDMP not reviewed this encounter.   Carlisle Beers, [place] 04/19/23 1030

## 2023-04-19 NOTE — Discharge Instructions (Signed)
Your pain is likely due to a muscle strain which will improve on its own with time.   - Take tylenol 1,000mg  every 6 hours as needed for pain to the low back. - You may also take the prescribed muscle relaxer as directed as needed for muscle aches/spasm.  Do not take this medication and drive or drink alcohol as it can make you sleepy.  Mainly use this medicine at nighttime as needed. - Apply heat 20 minutes on then 20 minutes off and perform gentle range of motion exercises to the area of greatest pain to prevent muscle stiffness and provide further pain relief.   Red flag symptoms to watch out for are numbness/tingling to the legs, weakness, loss of bowel/bladder control, and/or worsening pain that does not respond well to medicines. Follow-up with your primary care provider or return to urgent care if your symptoms do not improve in the next 3 to 4 days with medications and interventions recommended today. If your symptoms are severe (red flag), please go to the emergency room.  I hope you feel better!

## 2023-04-19 NOTE — ED Triage Notes (Signed)
Pt reports hip pain started on Sunday. Pt denies any type of injury.

## 2023-06-22 ENCOUNTER — Encounter (HOSPITAL_COMMUNITY): Payer: Self-pay

## 2023-06-22 ENCOUNTER — Ambulatory Visit (HOSPITAL_COMMUNITY)
Admission: EM | Admit: 2023-06-22 | Discharge: 2023-06-22 | Disposition: A | Discharge status: Home / Self Care | Payer: 59 | Attending: Family Medicine | Admitting: Family Medicine

## 2023-06-22 DIAGNOSIS — M62838 Other muscle spasm: Secondary | ICD-10-CM | POA: Diagnosis not present

## 2023-06-22 MED ORDER — TIZANIDINE HCL 4 MG PO TABS: 4.0000 mg | ORAL_TABLET | Freq: Three times a day (TID) | ORAL | 0 refills | Status: AC | PRN

## 2023-06-22 NOTE — ED Provider Notes (Signed)
[facility]   [id number] 06/22/23 Arrival Time: 4098  ASSESSMENT & PLAN:  1. Muscle spasms of neck    Exacerbation of infrequent but chronic problem.  Trial of: New Prescriptions   TIZANIDINE (ZANAFLEX) 4 MG TABLET    Take 1 tablet (4 mg total) by mouth every 8 (eight) hours as needed for muscle spasms.   Work/school excuse note: provided. Recommend:  Follow-up Information     [facility].   Why: If worsening or failing to improve as anticipated. Contact information: [address] [place] [address] [phone]                Reviewed expectations re: course of current medical issues. Questions answered. Outlined signs and symptoms indicating need for more acute intervention. Patient verbalized understanding. After Visit Summary given.  SUBJECTIVE: History from: patient. Emma Wilson is a 67 y.o. female who reports posterior and right-sided neck pain x 3 days. H/O similar; has been sev months since last flare. Tramadol and diazepam with a little relief. No extremity sensation changes or weakness. Denies visual changes and headache.  Past Surgical History:  Procedure Laterality Date   COLONOSCOPY  10/13/2010   external and internal hemorrhoids, repeat 2021; Dr. Vida Rigger   CORONARY BALLOON ANGIOPLASTY N/A 08/06/2021   Procedure: CORONARY BALLOON ANGIOPLASTY;  Surgeon: Marykay Lex, MD;  Location: [facility] INVASIVE CV LAB;  Service: Cardiovascular;  Laterality: N/A;   CORONARY PRESSURE/FFR STUDY N/A 08/06/2021   Procedure: INTRAVASCULAR PRESSURE WIRE/FFR STUDY;  Surgeon: Marykay Lex, MD;  Location: [facility] INVASIVE CV LAB;  Service: Cardiovascular;  Laterality: N/A;   CORONARY STENT INTERVENTION N/A 08/06/2021   Procedure: CORONARY STENT INTERVENTION;  Surgeon: Marykay Lex, MD;  Location: [facility] INVASIVE CV LAB;  Service: Cardiovascular;  Laterality: N/A;   CYSTOSCOPY  02/26/2013   Dr. Annabell Howells (normal;  follicular cystitis)   EUS N/A 03/18/2016   Procedure: UPPER ENDOSCOPIC ULTRASOUND (EUS) RADIAL;  Surgeon: Rachael Fee, MD;  Location: [facility] ENDOSCOPY;  Service: Endoscopy;  Laterality: N/A;   LEFT HEART CATH AND CORONARY ANGIOGRAPHY N/A 08/06/2021   Procedure: LEFT HEART CATH AND CORONARY ANGIOGRAPHY;  Surgeon: Marykay Lex, MD;  Location: [facility] INVASIVE CV LAB;  Service: Cardiovascular;  Laterality: N/A;   THYROIDECTOMY, PARTIAL  11/2005; 11/2006   TOTAL ABDOMINAL HYSTERECTOMY  ~ 1990   due to bleeding   TUBAL LIGATION  1980's      OBJECTIVE:  Vitals:   06/22/23 1010 06/22/23 1016  BP: (!) 159/89   Pulse:  (!) 45  Resp: 16   Temp: 98 F (36.7 C)   TempSrc: Oral   SpO2:  100%    General appearance: alert; no distress HEENT: [place]; AT Neck: stiff; TTP over R post neck musculature extending over R trapezius distribution Resp: unlabored respirations Extremities: moves all normally Skin: warm and dry; no visible rashes Neurologic: gait normal; normal sensation and strength of bilateral UE Psychological: alert and cooperative; normal mood and affect   No Known Allergies  Past Medical History:  Diagnosis Date   Abnormality of gait 03/19/2014   one leg shorter than other"uses cane frequently"   Carpal tunnel syndrome    right wrist   Chronic back pain    DDD   COPD (chronic obstructive pulmonary disease) (HCC)    Depression 2006   "after my son passed" (12/05/2013)   Fibromyalgia    GERD (gastroesophageal reflux disease)    Hematuria  microscopic hematuria (urol & nephrol w/u neg), multiple UTI`s, glomerular from [facility]it, Dr. Annabell Howells 2014   Hyperlipidemia    managed by Dr. Dareen Piano   Hypothyroidism    Leg length inequality    sees rheumatology   Migraine    "q 3-4 months" (12/05/2013)   Osteoarthritis    Rheumatoid and OA?  Dr. Maude Leriche ([facility], rheumatology)   Pneumonia    "abuntantly as a child"   Rheumatoid arthritis (HCC)    Dr. Maude Leriche   Shortness of breath     "at rest" (12/05/2013)   Sinus bradycardia    Thyroid cancer (HCC)    S/P radiation & OR   Social History   Socioeconomic History   Marital status: Divorced    Spouse name: Not on file   Number of children: 3   Years of education: college   Highest education level: Not on file  Occupational History   Occupation: Marine scientist: [employer]  Tobacco Use   Smoking status: Every Day    Current packs/day: 0.50    Average packs/day: 0.5 packs/day for 35.0 years (17.5 ttl pk-yrs)    Types: Cigarettes   Smokeless tobacco: Never  Vaping Use   Vaping status: Never Used  Substance and Sexual Activity   Alcohol use: Yes   Drug use: No   Sexual activity: Not Currently    Partners: Male  Other Topics Concern   Not on file  Social History Narrative   Lives alone.  2 living children (1 deceased), 1 in [place], 1 in [place], 6 grandchildren.  Works at [employer].   Exercise some.  As of 07/2016   Social Determinants of Health   Financial Resource Strain: Not on file  Food Insecurity: Not on file  Transportation Needs: Not on file  Physical Activity: Not on file  Stress: Not on file  Social Connections: Not on file   Family History  Problem Relation Age of Onset   Stroke Mother    Coronary artery disease Mother    Kidney disease Mother        on dialysis   Heart disease Mother    Cancer Father        multiple myeloma   Arthritis Sister        rheumatoid and osteoarthritis   Fibromyalgia Sister    Arthritis Sister        RA and OA   Diabetes Son    Past Surgical History:  Procedure Laterality Date   COLONOSCOPY  10/13/2010   external and internal hemorrhoids, repeat 2021; Dr. Vida Rigger   CORONARY BALLOON ANGIOPLASTY N/A 08/06/2021   Procedure: CORONARY BALLOON ANGIOPLASTY;  Surgeon: Marykay Lex, MD;  Location: [facility] INVASIVE CV LAB;  Service: Cardiovascular;  Laterality: N/A;   CORONARY PRESSURE/FFR STUDY N/A 08/06/2021   Procedure: INTRAVASCULAR PRESSURE  WIRE/FFR STUDY;  Surgeon: Marykay Lex, MD;  Location: [facility] INVASIVE CV LAB;  Service: Cardiovascular;  Laterality: N/A;   CORONARY STENT INTERVENTION N/A 08/06/2021   Procedure: CORONARY STENT INTERVENTION;  Surgeon: Marykay Lex, MD;  Location: [facility] INVASIVE CV LAB;  Service: Cardiovascular;  Laterality: N/A;   CYSTOSCOPY  02/26/2013   Dr. Annabell Howells (normal; follicular cystitis)   EUS N/A 03/18/2016   Procedure: UPPER ENDOSCOPIC ULTRASOUND (EUS) RADIAL;  Surgeon: Rachael Fee, MD;  Location: [facility] ENDOS[facility];  Service: Endoscopy;  Laterality: N/A;   LEFT HEART CATH AND CORONARY ANGIOGRAPHY N/A 08/06/2021   Procedure: LEFT HEART CATH AND  CORONARY ANGIOGRAPHY;  Surgeon: Marykay Lex, MD;  Location: [facility] INVASIVE CV LAB;  Service: Cardiovascular;  Laterality: N/A;   THYROIDECTOMY, PARTIAL  11/2005; 11/2006   TOTAL ABDOMINAL HYSTERECTOMY  ~ 1990   due to bleeding   TUBAL LIGATION  1980's       Mardella Layman, MD 06/22/23 1037

## 2023-06-22 NOTE — ED Triage Notes (Signed)
Patient c/o posterior and right -sided neck pain x 3 days.  Patient states she has been taking Tramadol and Diazepam. Patient states she took both at 0700 today.

## 2024-02-06 ENCOUNTER — Ambulatory Visit (HOSPITAL_COMMUNITY)
Admission: EM | Admit: 2024-02-06 | Discharge: 2024-02-06 | Disposition: A | Discharge status: Home / Self Care | Payer: Self-pay

## 2024-02-06 ENCOUNTER — Encounter (HOSPITAL_COMMUNITY): Payer: Self-pay

## 2024-02-06 ENCOUNTER — Ambulatory Visit (INDEPENDENT_AMBULATORY_CARE_PROVIDER_SITE_OTHER): Payer: Self-pay

## 2024-02-06 DIAGNOSIS — B349 Viral infection, unspecified: Secondary | ICD-10-CM

## 2024-02-06 DIAGNOSIS — R053 Chronic cough: Secondary | ICD-10-CM

## 2024-02-06 DIAGNOSIS — R29898 Other symptoms and signs involving the musculoskeletal system: Secondary | ICD-10-CM

## 2024-02-06 MED ORDER — AZELASTINE HCL 0.1 % NA SOLN: 1.0000 | Freq: Two times a day (BID) | NASAL | 1 refills | Status: AC

## 2024-02-06 MED ORDER — CETIRIZINE HCL 10 MG PO TABS: 10.0000 mg | ORAL_TABLET | Freq: Every day | ORAL | 0 refills | Status: AC

## 2024-02-06 NOTE — ED Triage Notes (Signed)
 Patient here today with c/o cough, nasal congestion, and chills for over a month now. She has been taking Mucinex with no relief. No known sick contacts.   Patient is also c/o left arm heaviness that started this morning. Denies chest pain.

## 2024-02-06 NOTE — Discharge Instructions (Signed)
 1. Arm heaviness - ED EKG shows sinus bradycardia with a ventricular rate of 48 bpm, suggest follow-up with cardiology for further evaluation and ongoing management due to your history of ACS with stent placement.  No acute STEMI noted on EKG.  3. Acute viral syndrome - DG Chest 2 View x-ray performed in UC shows no acute cardiopulmonary findings, no consolidation or pneumonia. - azelastine (ASTELIN) 0.1 % nasal spray; Place 1 spray into both nostrils 2 (two) times daily. Use in each nostril as directed  Dispense: 30 mL; Refill: 1 - cetirizine (ZYRTEC ALLERGY) 10 MG tablet; Take 1 tablet (10 mg total) by mouth daily.  Dispense: 30 tablet; Refill: 0 -Take prescribed medications as directed, continue to monitor symptoms for any change in severity if there is any escalation of current symptoms or development of new symptoms follow-up for further evaluation immediately.

## 2024-02-06 NOTE — ED Provider Notes (Signed)
 [facility]  Note:  This document was prepared using Dragon voice recognition software and may include unintentional dictation errors.  MRN: [id number] DOB: 03-Aug-1956  Subjective:   Emma Wilson is a 68 y.o. female presenting for persistent cough, nasal congestion, chills over the last 4 weeks.  Patient admits to using Mucinex with no results.  No known sick contacts.  Denies shortness of breath, chest pain, weakness, dizziness.  Patient also reports development of left arm heaviness this morning, no pain, no weakness but arm just feels different.  Patient reports history of fibromyalgia and states that the pain may be that but she wanted to make sure her heart was okay because of her history of ACS with stent placement.  No current facility-administered medications for this encounter.  Current Outpatient Medications:    azelastine (ASTELIN) 0.1 % nasal spray, Place 1 spray into both nostrils 2 (two) times daily. Use in each nostril as directed, Disp: 30 mL, Rfl: 1   cetirizine (ZYRTEC ALLERGY) 10 MG tablet, Take 1 tablet (10 mg total) by mouth daily., Disp: 30 tablet, Rfl: 0   aspirin EC 81 MG tablet, Take 81 mg by mouth daily. Swallow whole., Disp: , Rfl:    diclofenac sodium (VOLTAREN) 1 % GEL, Apply 1 application topically 3 (three) times daily as needed (pain)., Disp: , Rfl:    ezetimibe (ZETIA) 10 MG tablet, Take 1 tablet (10 mg total) by mouth daily. (Patient not taking: Reported on 02/06/2024), Disp: 90 tablet, Rfl: 3   nicotine (NICODERM CQ) 7 mg/24hr patch, Place 1 patch (7 mg total) onto the skin daily., Disp: 28 patch, Rfl: 0   OVER THE COUNTER MEDICATION, Apply 1 application. topically daily as needed (pain). Cannabidiol, Disp: , Rfl:    Polyvinyl Alcohol-Povidone (REFRESH OP), Place 1 drop into both eyes daily as needed (dry eyes)., Disp: , Rfl:    rosuvastatin (CRESTOR) 40 MG tablet, Take 40 mg by mouth daily., Disp: , Rfl:    SYNTHROID 137 MCG tablet, Take 1  tablet (137 mcg total) by mouth daily before breakfast., Disp: 90 tablet, Rfl: 1   tiZANidine (ZANAFLEX) 4 MG tablet, Take 1 tablet (4 mg total) by mouth every 8 (eight) hours as needed for muscle spasms., Disp: 30 tablet, Rfl: 0   traMADol (ULTRAM) 50 MG tablet, Take 50 mg by mouth 3 (three) times daily as needed for moderate pain., Disp: , Rfl:    No Known Allergies  Past Medical History:  Diagnosis Date   Abnormality of gait 03/19/2014   one leg shorter than other"uses cane frequently"   Carpal tunnel syndrome    right wrist   Chronic back pain    DDD   COPD (chronic obstructive pulmonary disease) (HCC)    Depression 2006   "after my son passed" (12/05/2013)   Fibromyalgia    GERD (gastroesophageal reflux disease)    Hematuria    microscopic hematuria (urol & nephrol w/u neg), multiple UTI`s, glomerular from [facility], Dr. Annabell Howells 2014   Hyperlipidemia    managed by Dr. Dareen Piano   Hypothyroidism    Leg length inequality    sees rheumatology   Migraine    "q 3-4 months" (12/05/2013)   Osteoarthritis    Rheumatoid and OA?  Dr. Maude Leriche ([facility], rheumatology)   Pneumonia    "abuntantly as a child"   Rheumatoid arthritis (HCC)    Dr. Maude Leriche   Shortness of breath    "at rest" (12/05/2013)   Sinus bradycardia  Thyroid cancer [facility])    S/P radiation & OR     Past Surgical History:  Procedure Laterality Date   COLONOSCOPY  10/13/2010   external and internal hemorrhoids, repeat 2021; Dr. Vida Rigger   CORONARY BALLOON ANGIOPLASTY N/A 08/06/2021   Procedure: CORONARY BALLOON ANGIOPLASTY;  Surgeon: Marykay Lex, MD;  Location: [facility] INVASIVE CV LAB;  Service: Cardiovascular;  Laterality: N/A;   CORONARY PRESSURE/FFR STUDY N/A 08/06/2021   Procedure: INTRAVASCULAR PRESSURE WIRE/FFR STUDY;  Surgeon: Marykay Lex, MD;  Location: [facility] INVASIVE CV LAB;  Service: Cardiovascular;  Laterality: N/A;   CORONARY STENT INTERVENTION N/A 08/06/2021   Procedure: CORONARY STENT INTERVENTION;   Surgeon: Marykay Lex, MD;  Location: [facility] INVASIVE CV LAB;  Service: Cardiovascular;  Laterality: N/A;   CYSTOSCOPY  02/26/2013   Dr. Annabell Howells (normal; follicular cystitis)   EUS N/A 03/18/2016   Procedure: UPPER ENDOSCOPIC ULTRASOUND (EUS) RADIAL;  Surgeon: Rachael Fee, MD;  Location: [facility] END[facility];  Service: Endoscopy;  Laterality: N/A;   LEFT HEART CATH AND CORONARY ANGIOGRAPHY N/A 08/06/2021   Procedure: LEFT HEART CATH AND CORONARY ANGIOGRAPHY;  Surgeon: Marykay Lex, MD;  Location: [facility] INVASIVE CV LAB;  Service: Cardiovascular;  Laterality: N/A;   THYROIDECTOMY, PARTIAL  11/2005; 11/2006   TOTAL ABDOMINAL HYSTERECTOMY  ~ 1990   due to bleeding   TUBAL LIGATION  1980's    Family History  Problem Relation Age of Onset   Stroke Mother    Coronary artery disease Mother    Kidney disease Mother        on dialysis   Heart disease Mother    Cancer Father        multiple myeloma   Arthritis Sister        rheumatoid and osteoarthritis   Fibromyalgia Sister    Arthritis Sister        RA and OA   Diabetes Son     Social History   Tobacco Use   Smoking status: Every Day    Current packs/day: 0.50    Average packs/day: 0.5 packs/day for 35.0 years (17.5 ttl pk-yrs)    Types: Cigarettes   Smokeless tobacco: Never  Vaping Use   Vaping status: Never Used  Substance Use Topics   Alcohol use: Yes   Drug use: No    ROS Refer to HPI for ROS details.  Objective:   Vitals: BP (!) 153/92 (BP Location: Left Arm)   Pulse (!) 51   Temp 98 F (36.7 C) (Oral)   Resp 16   Ht 5\' 8"  (1.727 m)   Wt 135 lb (61.2 kg)   SpO2 93%   BMI 20.53 kg/m   Physical Exam Vitals and nursing note reviewed.  Constitutional:      General: She is not in acute distress.    Appearance: Normal appearance. She is well-developed. She is not ill-appearing or toxic-appearing.  HENT:     Head: Normocephalic.     Nose: Congestion present. No rhinorrhea.     Mouth/Throat:     Mouth: Mucous membranes  are moist.  Cardiovascular:     Rate and Rhythm: Normal rate and regular rhythm.     Heart sounds: No murmur heard. Pulmonary:     Effort: Pulmonary effort is normal. No respiratory distress.     Breath sounds: Normal breath sounds. No stridor. No wheezing, rhonchi or rales.  Skin:    General: Skin is warm and dry.  Neurological:     General: No  focal deficit present.     Mental Status: She is alert and oriented to person, place, and time.  Psychiatric:        Mood and Affect: Mood normal.        Behavior: Behavior normal.     Procedures  No results found for this or any previous visit (from the past 24 hours).  Assessment and Plan :   PDMP not reviewed this encounter.  1. Acute viral syndrome   2. Arm heaviness    1. Arm heaviness - ED EKG shows sinus bradycardia with a ventricular rate of 48 bpm, suggest follow-up with cardiology for further evaluation and ongoing management due to your history of ACS with stent placement.  No acute STEMI noted on EKG.  3. Acute viral syndrome - DG Chest 2 View x-ray performed in UC shows no acute cardiopulmonary findings, no consolidation or pneumonia. - azelastine (ASTELIN) 0.1 % nasal spray; Place 1 spray into both nostrils 2 (two) times daily. Use in each nostril as directed  Dispense: 30 mL; Refill: 1 - cetirizine (ZYRTEC ALLERGY) 10 MG tablet; Take 1 tablet (10 mg total) by mouth daily.  Dispense: 30 tablet; Refill: 0 -Take prescribed medications as directed, continue to monitor symptoms for any change in severity if there is any escalation of current symptoms or development of new symptoms follow-up for further evaluation immediately.  Lucky Cowboy   [place], [place], [place] 02/06/24 1323

## 2024-07-12 ENCOUNTER — Other Ambulatory Visit: Payer: Self-pay
